# Patient Record
Sex: Female | Born: 1940 | Race: White | Hispanic: No | Marital: Single | Smoking: Former smoker
Health system: Southern US, Community
[De-identification: ages and names within clinical notes are randomized; demographics above are authoritative.]

## PROBLEM LIST (undated history)

## (undated) DIAGNOSIS — C3490 Malignant neoplasm of unspecified part of unspecified bronchus or lung: Secondary | ICD-10-CM

## (undated) DIAGNOSIS — G250 Essential tremor: Secondary | ICD-10-CM

## (undated) DIAGNOSIS — J449 Chronic obstructive pulmonary disease, unspecified: Secondary | ICD-10-CM

## (undated) HISTORY — PX: MOUTH SURGERY: SHX715

## (undated) HISTORY — PX: APPENDECTOMY: SHX54

## (undated) HISTORY — PX: BREAST SURGERY: SHX581

## (undated) HISTORY — PX: OVARY SURGERY: SHX727

## (undated) HISTORY — PX: DEEP BRAIN STIMULATOR PLACEMENT: SHX608

## (undated) SURGERY — OPEN REDUCTION INTERNAL FIXATION HIP
Anesthesia: Choice | Laterality: Right

---

## 2009-04-25 ENCOUNTER — Emergency Department (HOSPITAL_BASED_OUTPATIENT_CLINIC_OR_DEPARTMENT_OTHER): Admission: EM | Admit: 2009-04-25 | Discharge: 2009-04-26 | Payer: Self-pay | Admitting: Emergency Medicine

## 2009-04-25 ENCOUNTER — Ambulatory Visit: Payer: Self-pay | Admitting: Diagnostic Radiology

## 2009-04-26 ENCOUNTER — Ambulatory Visit: Payer: Self-pay | Admitting: Diagnostic Radiology

## 2009-04-27 ENCOUNTER — Ambulatory Visit: Payer: Self-pay | Admitting: Internal Medicine

## 2009-04-27 DIAGNOSIS — R0982 Postnasal drip: Secondary | ICD-10-CM | POA: Insufficient documentation

## 2009-04-27 DIAGNOSIS — J4489 Other specified chronic obstructive pulmonary disease: Secondary | ICD-10-CM | POA: Insufficient documentation

## 2009-04-27 DIAGNOSIS — C3490 Malignant neoplasm of unspecified part of unspecified bronchus or lung: Secondary | ICD-10-CM | POA: Insufficient documentation

## 2009-04-27 DIAGNOSIS — J449 Chronic obstructive pulmonary disease, unspecified: Secondary | ICD-10-CM

## 2009-05-01 ENCOUNTER — Telehealth: Payer: Self-pay | Admitting: Internal Medicine

## 2009-05-02 ENCOUNTER — Ambulatory Visit (HOSPITAL_COMMUNITY): Admission: RE | Admit: 2009-05-02 | Discharge: 2009-05-02 | Payer: Self-pay | Admitting: Internal Medicine

## 2009-05-04 ENCOUNTER — Ambulatory Visit: Payer: Self-pay | Admitting: Internal Medicine

## 2009-05-04 ENCOUNTER — Encounter: Payer: Self-pay | Admitting: Thoracic Surgery

## 2009-05-04 ENCOUNTER — Ambulatory Visit (HOSPITAL_COMMUNITY): Admission: RE | Admit: 2009-05-04 | Discharge: 2009-05-04 | Payer: Self-pay | Admitting: Thoracic Surgery

## 2009-05-04 ENCOUNTER — Ambulatory Visit: Payer: Self-pay | Admitting: Thoracic Surgery

## 2009-05-10 ENCOUNTER — Ambulatory Visit: Payer: Self-pay | Admitting: Internal Medicine

## 2009-05-11 ENCOUNTER — Encounter: Payer: Self-pay | Admitting: Pulmonary Disease

## 2009-05-11 ENCOUNTER — Encounter: Payer: Self-pay | Admitting: Internal Medicine

## 2009-05-11 ENCOUNTER — Ambulatory Visit: Admission: RE | Admit: 2009-05-11 | Discharge: 2009-07-28 | Payer: Self-pay | Admitting: Radiation Oncology

## 2009-05-11 ENCOUNTER — Ambulatory Visit: Payer: Self-pay | Admitting: Pulmonary Disease

## 2009-05-11 ENCOUNTER — Ambulatory Visit: Payer: Self-pay | Admitting: Internal Medicine

## 2009-05-11 DIAGNOSIS — F17200 Nicotine dependence, unspecified, uncomplicated: Secondary | ICD-10-CM

## 2009-05-12 ENCOUNTER — Telehealth: Payer: Self-pay | Admitting: Internal Medicine

## 2009-05-15 ENCOUNTER — Ambulatory Visit (HOSPITAL_COMMUNITY): Admission: RE | Admit: 2009-05-15 | Discharge: 2009-05-15 | Payer: Self-pay | Admitting: Internal Medicine

## 2009-05-16 LAB — COMPREHENSIVE METABOLIC PANEL
CO2: 22 mEq/L (ref 19–32)
Calcium: 10.4 mg/dL (ref 8.4–10.5)
Chloride: 105 mEq/L (ref 96–112)
Glucose, Bld: 199 mg/dL — ABNORMAL HIGH (ref 70–99)
Sodium: 142 mEq/L (ref 135–145)
Total Bilirubin: 0.4 mg/dL (ref 0.3–1.2)
Total Protein: 6.7 g/dL (ref 6.0–8.3)

## 2009-05-16 LAB — CBC WITH DIFFERENTIAL/PLATELET
Eosinophils Absolute: 0 10*3/uL (ref 0.0–0.5)
HCT: 39.7 % (ref 34.8–46.6)
LYMPH%: 23.7 % (ref 14.0–49.7)
MONO#: 0.6 10*3/uL (ref 0.1–0.9)
NEUT#: 5.7 10*3/uL (ref 1.5–6.5)
NEUT%: 68.3 % (ref 38.4–76.8)
Platelets: 311 10*3/uL (ref 145–400)
WBC: 8.4 10*3/uL (ref 3.9–10.3)
lymph#: 2 10*3/uL (ref 0.9–3.3)

## 2009-05-22 ENCOUNTER — Telehealth: Payer: Self-pay | Admitting: Internal Medicine

## 2009-05-26 ENCOUNTER — Telehealth (INDEPENDENT_AMBULATORY_CARE_PROVIDER_SITE_OTHER): Payer: Self-pay | Admitting: *Deleted

## 2009-05-26 ENCOUNTER — Telehealth: Payer: Self-pay | Admitting: Internal Medicine

## 2009-05-31 LAB — CBC WITH DIFFERENTIAL/PLATELET
BASO%: 0.2 % (ref 0.0–2.0)
Eosinophils Absolute: 0.1 10*3/uL (ref 0.0–0.5)
HCT: 37.7 % (ref 34.8–46.6)
LYMPH%: 12.5 % — ABNORMAL LOW (ref 14.0–49.7)
MCHC: 33.9 g/dL (ref 31.5–36.0)
MCV: 89.6 fL (ref 79.5–101.0)
MONO#: 0.4 10*3/uL (ref 0.1–0.9)
MONO%: 2.1 % (ref 0.0–14.0)
NEUT%: 84.9 % — ABNORMAL HIGH (ref 38.4–76.8)
Platelets: 108 10*3/uL — ABNORMAL LOW (ref 145–400)
RBC: 4.21 10*6/uL (ref 3.70–5.45)
WBC: 17.8 10*3/uL — ABNORMAL HIGH (ref 3.9–10.3)

## 2009-05-31 LAB — COMPREHENSIVE METABOLIC PANEL
ALT: 43 U/L — ABNORMAL HIGH (ref 0–35)
Albumin: 4 g/dL (ref 3.5–5.2)
CO2: 24 mEq/L (ref 19–32)
Calcium: 9 mg/dL (ref 8.4–10.5)
Chloride: 105 mEq/L (ref 96–112)
Creatinine, Ser: 0.74 mg/dL (ref 0.40–1.20)
Sodium: 140 mEq/L (ref 135–145)
Total Protein: 6.6 g/dL (ref 6.0–8.3)

## 2009-06-05 ENCOUNTER — Ambulatory Visit: Payer: Self-pay | Admitting: Internal Medicine

## 2009-06-06 ENCOUNTER — Telehealth: Payer: Self-pay | Admitting: Internal Medicine

## 2009-06-07 ENCOUNTER — Encounter: Payer: Self-pay | Admitting: Internal Medicine

## 2009-06-07 LAB — CBC WITH DIFFERENTIAL/PLATELET
BASO%: 0.6 % (ref 0.0–2.0)
MCHC: 34 g/dL (ref 31.5–36.0)
MONO#: 1.1 10*3/uL — ABNORMAL HIGH (ref 0.1–0.9)
RBC: 4.14 10*6/uL (ref 3.70–5.45)
RDW: 14.3 % (ref 11.2–14.5)
WBC: 9.9 10*3/uL (ref 3.9–10.3)
lymph#: 2 10*3/uL (ref 0.9–3.3)

## 2009-06-07 LAB — COMPREHENSIVE METABOLIC PANEL
ALT: 52 U/L — ABNORMAL HIGH (ref 0–35)
AST: 27 U/L (ref 0–37)
CO2: 22 mEq/L (ref 19–32)
Calcium: 9.9 mg/dL (ref 8.4–10.5)
Chloride: 102 mEq/L (ref 96–112)
Sodium: 137 mEq/L (ref 135–145)
Total Bilirubin: 0.4 mg/dL (ref 0.3–1.2)
Total Protein: 6.9 g/dL (ref 6.0–8.3)

## 2009-06-14 LAB — CBC WITH DIFFERENTIAL/PLATELET
BASO%: 0.4 % (ref 0.0–2.0)
EOS%: 0.4 % (ref 0.0–7.0)
LYMPH%: 16.6 % (ref 14.0–49.7)
MCH: 30.7 pg (ref 25.1–34.0)
MCHC: 34.6 g/dL (ref 31.5–36.0)
MONO#: 0.1 10*3/uL (ref 0.1–0.9)
Platelets: 317 10*3/uL (ref 145–400)
RBC: 3.62 10*6/uL — ABNORMAL LOW (ref 3.70–5.45)
WBC: 5.7 10*3/uL (ref 3.9–10.3)

## 2009-06-14 LAB — COMPREHENSIVE METABOLIC PANEL
ALT: 41 U/L — ABNORMAL HIGH (ref 0–35)
AST: 17 U/L (ref 0–37)
Alkaline Phosphatase: 147 U/L — ABNORMAL HIGH (ref 39–117)
CO2: 21 mEq/L (ref 19–32)
Creatinine, Ser: 0.59 mg/dL (ref 0.40–1.20)
Sodium: 136 mEq/L (ref 135–145)
Total Bilirubin: 0.8 mg/dL (ref 0.3–1.2)
Total Protein: 6.4 g/dL (ref 6.0–8.3)

## 2009-06-27 ENCOUNTER — Ambulatory Visit (HOSPITAL_COMMUNITY): Admission: RE | Admit: 2009-06-27 | Discharge: 2009-06-27 | Payer: Self-pay | Admitting: Internal Medicine

## 2009-06-28 ENCOUNTER — Encounter: Payer: Self-pay | Admitting: Internal Medicine

## 2009-06-28 LAB — COMPREHENSIVE METABOLIC PANEL
ALT: 45 U/L — ABNORMAL HIGH (ref 0–35)
AST: 25 U/L (ref 0–37)
Albumin: 3.9 g/dL (ref 3.5–5.2)
Alkaline Phosphatase: 117 U/L (ref 39–117)
Glucose, Bld: 91 mg/dL (ref 70–99)
Potassium: 3.6 mEq/L (ref 3.5–5.3)
Sodium: 140 mEq/L (ref 135–145)
Total Protein: 6.4 g/dL (ref 6.0–8.3)

## 2009-06-28 LAB — CBC WITH DIFFERENTIAL/PLATELET
EOS%: 0 % (ref 0.0–7.0)
LYMPH%: 12.1 % — ABNORMAL LOW (ref 14.0–49.7)
MCH: 29.6 pg (ref 25.1–34.0)
MCHC: 32.9 g/dL (ref 31.5–36.0)
MCV: 89.9 fL (ref 79.5–101.0)
MONO%: 11.1 % (ref 0.0–14.0)
NEUT#: 8.3 10*3/uL — ABNORMAL HIGH (ref 1.5–6.5)
RBC: 3.28 10*6/uL — ABNORMAL LOW (ref 3.70–5.45)
RDW: 16 % — ABNORMAL HIGH (ref 11.2–14.5)

## 2009-06-29 ENCOUNTER — Ambulatory Visit: Payer: Self-pay | Admitting: Internal Medicine

## 2009-07-12 LAB — CBC WITH DIFFERENTIAL/PLATELET
EOS%: 1.4 % (ref 0.0–7.0)
Eosinophils Absolute: 0.1 10*3/uL (ref 0.0–0.5)
LYMPH%: 15.1 % (ref 14.0–49.7)
MCH: 29.7 pg (ref 25.1–34.0)
MCV: 90.2 fL (ref 79.5–101.0)
MONO%: 13 % (ref 0.0–14.0)
NEUT#: 3.4 10*3/uL (ref 1.5–6.5)
Platelets: 46 10*3/uL — ABNORMAL LOW (ref 145–400)
RBC: 2.56 10*6/uL — ABNORMAL LOW (ref 3.70–5.45)
nRBC: 1 % — ABNORMAL HIGH (ref 0–0)

## 2009-07-12 LAB — COMPREHENSIVE METABOLIC PANEL
ALT: 33 U/L (ref 0–35)
Alkaline Phosphatase: 127 U/L — ABNORMAL HIGH (ref 39–117)
CO2: 26 mEq/L (ref 19–32)
Creatinine, Ser: 0.65 mg/dL (ref 0.40–1.20)
Glucose, Bld: 91 mg/dL (ref 70–99)
Total Bilirubin: 0.5 mg/dL (ref 0.3–1.2)

## 2009-07-13 ENCOUNTER — Encounter (HOSPITAL_COMMUNITY): Admission: RE | Admit: 2009-07-13 | Discharge: 2009-09-22 | Payer: Self-pay | Admitting: Internal Medicine

## 2009-07-13 LAB — TYPE & CROSSMATCH - CHCC

## 2009-07-19 ENCOUNTER — Encounter: Payer: Self-pay | Admitting: Internal Medicine

## 2009-07-19 ENCOUNTER — Ambulatory Visit: Admission: RE | Admit: 2009-07-19 | Discharge: 2009-07-19 | Payer: Self-pay | Admitting: Internal Medicine

## 2009-07-19 LAB — CBC WITH DIFFERENTIAL/PLATELET
BASO%: 0.4 % (ref 0.0–2.0)
EOS%: 0.4 % (ref 0.0–7.0)
Eosinophils Absolute: 0 10*3/uL (ref 0.0–0.5)
HCT: 33.7 % — ABNORMAL LOW (ref 34.8–46.6)
HGB: 11.2 g/dL — ABNORMAL LOW (ref 11.6–15.9)
LYMPH%: 21 % (ref 14.0–49.7)
MONO#: 0.7 10*3/uL (ref 0.1–0.9)
MONO%: 15.3 % — ABNORMAL HIGH (ref 0.0–14.0)
WBC: 4.6 10*3/uL (ref 3.9–10.3)
lymph#: 1 10*3/uL (ref 0.9–3.3)
nRBC: 0 % (ref 0–0)

## 2009-07-19 LAB — COMPREHENSIVE METABOLIC PANEL
ALT: 32 U/L (ref 0–35)
Alkaline Phosphatase: 128 U/L — ABNORMAL HIGH (ref 39–117)
Creatinine, Ser: 0.62 mg/dL (ref 0.40–1.20)
Sodium: 137 mEq/L (ref 135–145)
Total Bilirubin: 0.6 mg/dL (ref 0.3–1.2)
Total Protein: 6.3 g/dL (ref 6.0–8.3)

## 2009-07-26 LAB — COMPREHENSIVE METABOLIC PANEL
Albumin: 4.2 g/dL (ref 3.5–5.2)
BUN: 11 mg/dL (ref 6–23)
Calcium: 9 mg/dL (ref 8.4–10.5)
Chloride: 102 mEq/L (ref 96–112)
Glucose, Bld: 85 mg/dL (ref 70–99)
Potassium: 4 mEq/L (ref 3.5–5.3)

## 2009-07-26 LAB — CBC WITH DIFFERENTIAL/PLATELET
Basophils Absolute: 0 10*3/uL (ref 0.0–0.1)
Eosinophils Absolute: 0 10*3/uL (ref 0.0–0.5)
HCT: 28.4 % — ABNORMAL LOW (ref 34.8–46.6)
HGB: 9.9 g/dL — ABNORMAL LOW (ref 11.6–15.9)
MCV: 92.6 fL (ref 79.5–101.0)
MONO%: 10.5 % (ref 0.0–14.0)
NEUT#: 1.4 10*3/uL — ABNORMAL LOW (ref 1.5–6.5)
RDW: 17.1 % — ABNORMAL HIGH (ref 11.2–14.5)

## 2009-07-31 ENCOUNTER — Ambulatory Visit: Payer: Self-pay | Admitting: Internal Medicine

## 2009-08-04 ENCOUNTER — Ambulatory Visit (HOSPITAL_COMMUNITY): Admission: RE | Admit: 2009-08-04 | Discharge: 2009-08-04 | Payer: Self-pay | Admitting: Internal Medicine

## 2009-08-04 LAB — COMPREHENSIVE METABOLIC PANEL
AST: 26 U/L (ref 0–37)
Albumin: 4.4 g/dL (ref 3.5–5.2)
BUN: 6 mg/dL (ref 6–23)
Calcium: 9.4 mg/dL (ref 8.4–10.5)
Chloride: 105 mEq/L (ref 96–112)
Glucose, Bld: 145 mg/dL — ABNORMAL HIGH (ref 70–99)
Potassium: 4.3 mEq/L (ref 3.5–5.3)

## 2009-08-04 LAB — CBC WITH DIFFERENTIAL/PLATELET
Basophils Absolute: 0 10*3/uL (ref 0.0–0.1)
Eosinophils Absolute: 0 10*3/uL (ref 0.0–0.5)
HGB: 9.1 g/dL — ABNORMAL LOW (ref 11.6–15.9)
MONO#: 0 10*3/uL — ABNORMAL LOW (ref 0.1–0.9)
NEUT#: 15.2 10*3/uL — ABNORMAL HIGH (ref 1.5–6.5)
RDW: 16.6 % — ABNORMAL HIGH (ref 11.2–14.5)
lymph#: 1.3 10*3/uL (ref 0.9–3.3)

## 2009-08-09 ENCOUNTER — Encounter: Payer: Self-pay | Admitting: Internal Medicine

## 2009-08-09 LAB — CBC WITH DIFFERENTIAL/PLATELET
BASO%: 0.6 % (ref 0.0–2.0)
Eosinophils Absolute: 0 10*3/uL (ref 0.0–0.5)
MCV: 95.3 fL (ref 79.5–101.0)
MONO%: 13.7 % (ref 0.0–14.0)
NEUT#: 3.7 10*3/uL (ref 1.5–6.5)
RBC: 3 10*6/uL — ABNORMAL LOW (ref 3.70–5.45)
RDW: 19.1 % — ABNORMAL HIGH (ref 11.2–14.5)
WBC: 6.1 10*3/uL (ref 3.9–10.3)

## 2009-08-11 ENCOUNTER — Ambulatory Visit: Admission: RE | Admit: 2009-08-11 | Discharge: 2009-09-22 | Payer: Self-pay | Admitting: Radiation Oncology

## 2009-09-01 ENCOUNTER — Ambulatory Visit (HOSPITAL_COMMUNITY): Admission: RE | Admit: 2009-09-01 | Discharge: 2009-09-01 | Payer: Self-pay | Admitting: Radiation Oncology

## 2009-09-25 ENCOUNTER — Ambulatory Visit: Admission: RE | Admit: 2009-09-25 | Discharge: 2009-09-29 | Payer: Self-pay | Admitting: Radiation Oncology

## 2009-10-05 ENCOUNTER — Ambulatory Visit: Payer: Self-pay | Admitting: Internal Medicine

## 2009-10-09 LAB — CBC WITH DIFFERENTIAL/PLATELET
BASO%: 0.4 % (ref 0.0–2.0)
Basophils Absolute: 0 10*3/uL (ref 0.0–0.1)
EOS%: 1 % (ref 0.0–7.0)
Eosinophils Absolute: 0 10*3/uL (ref 0.0–0.5)
HGB: 13.4 g/dL (ref 11.6–15.9)
LYMPH%: 22.8 % (ref 14.0–49.7)
MCV: 96.8 fL (ref 79.5–101.0)
MONO#: 0.5 10*3/uL (ref 0.1–0.9)
MONO%: 10.5 % (ref 0.0–14.0)
NEUT#: 3.2 10*3/uL (ref 1.5–6.5)
RDW: 12.7 % (ref 11.2–14.5)
WBC: 4.9 10*3/uL (ref 3.9–10.3)
lymph#: 1.1 10*3/uL (ref 0.9–3.3)

## 2009-10-09 LAB — COMPREHENSIVE METABOLIC PANEL
ALT: 25 U/L (ref 0–35)
Albumin: 4 g/dL (ref 3.5–5.2)
Alkaline Phosphatase: 88 U/L (ref 39–117)
Calcium: 9.6 mg/dL (ref 8.4–10.5)
Chloride: 102 mEq/L (ref 96–112)
Creatinine, Ser: 0.64 mg/dL (ref 0.40–1.20)
Potassium: 3.7 mEq/L (ref 3.5–5.3)

## 2009-10-10 ENCOUNTER — Ambulatory Visit (HOSPITAL_COMMUNITY): Admission: RE | Admit: 2009-10-10 | Discharge: 2009-10-10 | Payer: Self-pay | Admitting: Internal Medicine

## 2009-10-11 ENCOUNTER — Encounter: Payer: Self-pay | Admitting: Internal Medicine

## 2009-11-03 ENCOUNTER — Encounter: Payer: Self-pay | Admitting: Internal Medicine

## 2010-01-02 ENCOUNTER — Ambulatory Visit: Payer: Self-pay | Admitting: Internal Medicine

## 2010-01-04 ENCOUNTER — Ambulatory Visit (HOSPITAL_COMMUNITY): Admission: RE | Admit: 2010-01-04 | Discharge: 2010-01-04 | Payer: Self-pay | Admitting: Radiation Oncology

## 2010-01-04 LAB — COMPREHENSIVE METABOLIC PANEL
ALT: 20 U/L (ref 0–35)
AST: 20 U/L (ref 0–37)
Albumin: 3.9 g/dL (ref 3.5–5.2)
Alkaline Phosphatase: 97 U/L (ref 39–117)
BUN: 7 mg/dL (ref 6–23)
CO2: 22 mEq/L (ref 19–32)
Calcium: 9.6 mg/dL (ref 8.4–10.5)
Creatinine, Ser: 0.72 mg/dL (ref 0.40–1.20)
Glucose, Bld: 170 mg/dL — ABNORMAL HIGH (ref 70–99)
Total Bilirubin: 0.5 mg/dL (ref 0.3–1.2)
Total Protein: 6.7 g/dL (ref 6.0–8.3)

## 2010-01-04 LAB — CBC WITH DIFFERENTIAL/PLATELET
BASO%: 0 % (ref 0.0–2.0)
Eosinophils Absolute: 0 10*3/uL (ref 0.0–0.5)
HGB: 12.4 g/dL (ref 11.6–15.9)
MONO%: 1.1 % (ref 0.0–14.0)
NEUT#: 5 10*3/uL (ref 1.5–6.5)
NEUT%: 88.2 % — ABNORMAL HIGH (ref 38.4–76.8)
RBC: 3.88 10*6/uL (ref 3.70–5.45)

## 2010-01-05 ENCOUNTER — Encounter: Payer: Self-pay | Admitting: Internal Medicine

## 2010-01-09 ENCOUNTER — Encounter: Payer: Self-pay | Admitting: Internal Medicine

## 2010-04-04 ENCOUNTER — Ambulatory Visit: Payer: Self-pay | Admitting: Internal Medicine

## 2010-04-06 ENCOUNTER — Ambulatory Visit (HOSPITAL_COMMUNITY): Admission: RE | Admit: 2010-04-06 | Discharge: 2010-04-06 | Payer: Self-pay | Admitting: Internal Medicine

## 2010-04-06 LAB — COMPREHENSIVE METABOLIC PANEL
Alkaline Phosphatase: 108 U/L (ref 39–117)
BUN: 8 mg/dL (ref 6–23)
CO2: 31 mEq/L (ref 19–32)
Calcium: 10.1 mg/dL (ref 8.4–10.5)
Chloride: 101 mEq/L (ref 96–112)
Creatinine, Ser: 0.72 mg/dL (ref 0.40–1.20)
Potassium: 4.8 mEq/L (ref 3.5–5.3)
Total Bilirubin: 0.6 mg/dL (ref 0.3–1.2)

## 2010-04-06 LAB — CBC WITH DIFFERENTIAL/PLATELET
BASO%: 1.1 % (ref 0.0–2.0)
Basophils Absolute: 0.1 10*3/uL (ref 0.0–0.1)
EOS%: 0 % (ref 0.0–7.0)
Eosinophils Absolute: 0 10*3/uL (ref 0.0–0.5)
HCT: 38 % (ref 34.8–46.6)
HGB: 12.7 g/dL (ref 11.6–15.9)
LYMPH%: 12.6 % — ABNORMAL LOW (ref 14.0–49.7)
MCH: 31.2 pg (ref 25.1–34.0)
MCHC: 33.4 g/dL (ref 31.5–36.0)
MCV: 93.4 fL (ref 79.5–101.0)
MONO#: 0.1 10*3/uL (ref 0.1–0.9)
MONO%: 1.6 % (ref 0.0–14.0)
NEUT%: 84.7 % — ABNORMAL HIGH (ref 38.4–76.8)
RBC: 4.07 10*6/uL (ref 3.70–5.45)
RDW: 12.9 % (ref 11.2–14.5)
nRBC: 0 % (ref 0–0)

## 2010-04-10 ENCOUNTER — Encounter: Payer: Self-pay | Admitting: Internal Medicine

## 2010-04-12 ENCOUNTER — Ambulatory Visit (HOSPITAL_COMMUNITY): Admission: RE | Admit: 2010-04-12 | Discharge: 2010-04-12 | Payer: Self-pay | Admitting: Internal Medicine

## 2010-07-04 ENCOUNTER — Ambulatory Visit: Payer: Self-pay | Admitting: Internal Medicine

## 2010-07-06 ENCOUNTER — Ambulatory Visit (HOSPITAL_COMMUNITY): Admission: RE | Admit: 2010-07-06 | Discharge: 2010-07-06 | Payer: Self-pay | Admitting: Internal Medicine

## 2010-07-06 LAB — COMPREHENSIVE METABOLIC PANEL
Alkaline Phosphatase: 101 U/L (ref 39–117)
BUN: 8 mg/dL (ref 6–23)
Chloride: 106 mEq/L (ref 96–112)
Total Bilirubin: 0.7 mg/dL (ref 0.3–1.2)
Total Protein: 6.8 g/dL (ref 6.0–8.3)

## 2010-07-06 LAB — CBC WITH DIFFERENTIAL/PLATELET
Basophils Absolute: 0 10*3/uL (ref 0.0–0.1)
Eosinophils Absolute: 0 10*3/uL (ref 0.0–0.5)
HGB: 12.1 g/dL (ref 11.6–15.9)
LYMPH%: 20.5 % (ref 14.0–49.7)
MCHC: 34.8 g/dL (ref 31.5–36.0)
MONO#: 0.3 10*3/uL (ref 0.1–0.9)
NEUT#: 4.4 10*3/uL (ref 1.5–6.5)
RBC: 3.76 10*6/uL (ref 3.70–5.45)
WBC: 5.9 10*3/uL (ref 3.9–10.3)

## 2010-07-11 ENCOUNTER — Encounter: Payer: Self-pay | Admitting: Internal Medicine

## 2010-10-04 ENCOUNTER — Ambulatory Visit: Payer: Self-pay | Admitting: Internal Medicine

## 2010-10-11 ENCOUNTER — Ambulatory Visit (HOSPITAL_COMMUNITY)
Admission: RE | Admit: 2010-10-11 | Discharge: 2010-10-11 | Payer: Self-pay | Source: Home / Self Care | Attending: Internal Medicine | Admitting: Internal Medicine

## 2010-10-11 LAB — CBC WITH DIFFERENTIAL/PLATELET
BASO%: 0.2 % (ref 0.0–2.0)
Basophils Absolute: 0 10*3/uL (ref 0.0–0.1)
EOS%: 17.8 % — ABNORMAL HIGH (ref 0.0–7.0)
Eosinophils Absolute: 0.9 10*3/uL — ABNORMAL HIGH (ref 0.0–0.5)
HCT: 38.2 % (ref 34.8–46.6)
HGB: 13.1 g/dL (ref 11.6–15.9)
LYMPH%: 14.9 % (ref 14.0–49.7)
MCH: 31.6 pg (ref 25.1–34.0)
MCHC: 34.3 g/dL (ref 31.5–36.0)
MCV: 92.3 fL (ref 79.5–101.0)
MONO#: 0 10*3/uL — ABNORMAL LOW (ref 0.1–0.9)
MONO%: 0.5 % (ref 0.0–14.0)
NEUT#: 3.5 10*3/uL (ref 1.5–6.5)
NEUT%: 66.6 % (ref 38.4–76.8)
Platelets: 199 10*3/uL (ref 145–400)
RBC: 4.14 10*6/uL (ref 3.70–5.45)
RDW: 13.1 % (ref 11.2–14.5)
WBC: 5.3 10*3/uL (ref 3.9–10.3)
lymph#: 0.8 10*3/uL — ABNORMAL LOW (ref 0.9–3.3)

## 2010-10-11 LAB — CMP (CANCER CENTER ONLY)
ALT(SGPT): 24 U/L (ref 10–47)
AST: 21 U/L (ref 11–38)
Albumin: 3.6 g/dL (ref 3.3–5.5)
Alkaline Phosphatase: 91 U/L — ABNORMAL HIGH (ref 26–84)
BUN, Bld: 10 mg/dL (ref 7–22)
CO2: 26 mEq/L (ref 18–33)
Calcium: 9.3 mg/dL (ref 8.0–10.3)
Chloride: 100 mEq/L (ref 98–108)
Creat: 0.6 mg/dl (ref 0.6–1.2)
Glucose, Bld: 147 mg/dL — ABNORMAL HIGH (ref 73–118)
Potassium: 4.2 mEq/L (ref 3.3–4.7)
Sodium: 138 mEq/L (ref 128–145)
Total Bilirubin: 0.4 mg/dl (ref 0.20–1.60)
Total Protein: 7.1 g/dL (ref 6.4–8.1)

## 2010-10-13 ENCOUNTER — Encounter: Payer: Self-pay | Admitting: Internal Medicine

## 2010-10-14 ENCOUNTER — Encounter: Payer: Self-pay | Admitting: Radiation Oncology

## 2010-10-14 ENCOUNTER — Encounter: Payer: Self-pay | Admitting: Internal Medicine

## 2010-10-18 ENCOUNTER — Encounter: Payer: Self-pay | Admitting: Internal Medicine

## 2010-10-18 ENCOUNTER — Other Ambulatory Visit: Payer: Self-pay | Admitting: Internal Medicine

## 2010-10-18 DIAGNOSIS — C3490 Malignant neoplasm of unspecified part of unspecified bronchus or lung: Secondary | ICD-10-CM

## 2010-10-23 NOTE — Letter (Signed)
[facility]   Imported By: Lester Marrero 02/26/2010 08:11:23  _____________________________________________________________________  External Attachment:    Type:   Image     Comment:   External Document

## 2010-10-23 NOTE — Letter (Signed)
[facility]   Imported By: Sherian Rein 04/30/2010 09:41:56  _____________________________________________________________________  External Attachment:    Type:   Image     Comment:   External Document

## 2010-10-23 NOTE — Letter (Signed)
[facility]   Imported By: Lester Willacy 02/05/2010 10:26:48  _____________________________________________________________________  External Attachment:    Type:   Image     Comment:   External Document

## 2010-10-23 NOTE — Letter (Signed)
[facility]   Imported By: Lester  11/09/2009 07:44:53  _____________________________________________________________________  External Attachment:    Type:   Image     Comment:   External Document

## 2010-10-23 NOTE — Letter (Signed)
[facility]   Imported By: Lester [place] 12/13/2009 08:50:46  _____________________________________________________________________  External Attachment:    Type:   Image     Comment:   External Document

## 2010-10-23 NOTE — Letter (Signed)
Summary: [facility]  [facility]   Imported By: Lennie Odor 07/24/2010 10:14:31  _____________________________________________________________________  External Attachment:    Type:   Image     Comment:   External Document

## 2010-11-14 NOTE — Letter (Signed)
Summary: [facility]  [facility]   Imported By: Lennie Odor 11/08/2010 17:06:10  _____________________________________________________________________  External Attachment:    Type:   Image     Comment:   External Document

## 2010-12-08 LAB — POCT I-STAT, CHEM 8
Calcium, Ion: 1.28 mmol/L (ref 1.12–1.32)
Chloride: 101 mEq/L (ref 96–112)
Creatinine, Ser: 0.7 mg/dL (ref 0.4–1.2)
HCT: 42 % (ref 36.0–46.0)
Potassium: 4.8 mEq/L (ref 3.5–5.1)
Sodium: 137 mEq/L (ref 135–145)

## 2010-12-27 LAB — CROSSMATCH
ABO/RH(D): A POS
Antibody Screen: NEGATIVE

## 2010-12-29 LAB — POCT CARDIAC MARKERS
CKMB, poc: <1 ng/mL — ABNORMAL LOW (ref 1.0–8.0)
CKMB, poc: <1 ng/mL — ABNORMAL LOW (ref 1.0–8.0)
Myoglobin, poc: 46.1 ng/mL (ref 12–200)
Myoglobin, poc: 55.9 ng/mL (ref 12–200)
Troponin i, poc: <0.05 ng/mL (ref 0.00–0.09)

## 2010-12-29 LAB — DIFFERENTIAL
Basophils Absolute: 0 10*3/uL (ref 0.0–0.1)
Eosinophils Relative: 1 % (ref 0–5)
Lymphs Abs: 2.3 10*3/uL (ref 0.7–4.0)
Monocytes Absolute: 0.9 10*3/uL (ref 0.1–1.0)
Neutrophils Relative %: 67 % (ref 43–77)

## 2010-12-29 LAB — BASIC METABOLIC PANEL
Calcium: 9.7 mg/dL (ref 8.4–10.5)
Glucose, Bld: 110 mg/dL — ABNORMAL HIGH (ref 70–99)

## 2010-12-29 LAB — CBC
HCT: 41.4 % (ref 36.0–46.0)
MCHC: 33 g/dL (ref 30.0–36.0)
MCV: 90.6 fL (ref 78.0–100.0)
RBC: 4.57 MIL/uL (ref 3.87–5.11)

## 2010-12-30 LAB — CBC
MCHC: 33.5 g/dL (ref 30.0–36.0)
MCV: 90.6 fL (ref 78.0–100.0)
RBC: 4.34 MIL/uL (ref 3.87–5.11)
RDW: 13.5 % (ref 11.5–15.5)

## 2010-12-30 LAB — COMPREHENSIVE METABOLIC PANEL
CO2: 25 mEq/L (ref 19–32)
Calcium: 9.2 mg/dL (ref 8.4–10.5)
Creatinine, Ser: 0.65 mg/dL (ref 0.4–1.2)
GFR calc Af Amer: >60 mL/min (ref >60)
GFR calc non Af Amer: >60 mL/min (ref >60)
Glucose, Bld: 113 mg/dL — ABNORMAL HIGH (ref 70–99)
Sodium: 137 mEq/L (ref 135–145)
Total Protein: 6.9 g/dL (ref 6.0–8.3)

## 2010-12-30 LAB — PROTIME-INR: Prothrombin Time: 12.6 seconds (ref 11.6–15.2)

## 2010-12-30 LAB — CULTURE, RESPIRATORY W GRAM STAIN

## 2010-12-30 LAB — APTT: aPTT: 27 seconds (ref 24–37)

## 2011-02-05 ENCOUNTER — Other Ambulatory Visit: Payer: Self-pay | Admitting: Internal Medicine

## 2011-02-05 ENCOUNTER — Ambulatory Visit (HOSPITAL_COMMUNITY)
Admission: RE | Admit: 2011-02-05 | Discharge: 2011-02-05 | Disposition: A | Discharge status: Home / Self Care | Payer: Medicare Other | Source: Ambulatory Visit | Attending: Internal Medicine | Admitting: Internal Medicine

## 2011-02-05 ENCOUNTER — Encounter (HOSPITAL_COMMUNITY): Payer: Self-pay

## 2011-02-05 ENCOUNTER — Encounter (HOSPITAL_BASED_OUTPATIENT_CLINIC_OR_DEPARTMENT_OTHER): Payer: Medicare Other | Admitting: Internal Medicine

## 2011-02-05 DIAGNOSIS — C3490 Malignant neoplasm of unspecified part of unspecified bronchus or lung: Secondary | ICD-10-CM | POA: Insufficient documentation

## 2011-02-05 DIAGNOSIS — D3500 Benign neoplasm of unspecified adrenal gland: Secondary | ICD-10-CM | POA: Insufficient documentation

## 2011-02-05 DIAGNOSIS — R279 Unspecified lack of coordination: Secondary | ICD-10-CM | POA: Insufficient documentation

## 2011-02-05 DIAGNOSIS — R42 Dizziness and giddiness: Secondary | ICD-10-CM | POA: Insufficient documentation

## 2011-02-05 DIAGNOSIS — Y842 Radiological procedure and radiotherapy as the cause of abnormal reaction of the patient, or of later complication, without mention of misadventure at the time of the procedure: Secondary | ICD-10-CM | POA: Insufficient documentation

## 2011-02-05 DIAGNOSIS — K7689 Other specified diseases of liver: Secondary | ICD-10-CM | POA: Insufficient documentation

## 2011-02-05 DIAGNOSIS — T66XXXA Radiation sickness, unspecified, initial encounter: Secondary | ICD-10-CM | POA: Insufficient documentation

## 2011-02-05 HISTORY — DX: Malignant neoplasm of unspecified part of unspecified bronchus or lung: C34.90

## 2011-02-05 LAB — CMP (CANCER CENTER ONLY)
Albumin: 3.3 g/dL (ref 3.3–5.5)
Alkaline Phosphatase: 109 U/L — ABNORMAL HIGH (ref 26–84)
BUN, Bld: 15 mg/dL (ref 7–22)
Creat: 0.7 mg/dl (ref 0.6–1.2)
Glucose, Bld: 164 mg/dL — ABNORMAL HIGH (ref 73–118)
Potassium: 4.2 mEq/L (ref 3.3–4.7)
Total Bilirubin: 0.4 mg/dl (ref 0.20–1.60)

## 2011-02-05 LAB — CBC WITH DIFFERENTIAL/PLATELET
Basophils Absolute: 0 10*3/uL (ref 0.0–0.1)
Eosinophils Absolute: 0 10*3/uL (ref 0.0–0.5)
HCT: 37.8 % (ref 34.8–46.6)
HGB: 13 g/dL (ref 11.6–15.9)
LYMPH%: 17 % (ref 14.0–49.7)
MCH: 31.9 pg (ref 25.1–34.0)
MCV: 92.8 fL (ref 79.5–101.0)
MONO%: 0.9 % (ref 0.0–14.0)
NEUT#: 4 10*3/uL (ref 1.5–6.5)
NEUT%: 81.9 % — ABNORMAL HIGH (ref 38.4–76.8)
Platelets: 187 10*3/uL (ref 145–400)

## 2011-02-05 MED ORDER — IOHEXOL 300 MG/ML  SOLN
100.0000 mL | Freq: Once | INTRAMUSCULAR | Status: AC | PRN
  Administered 2011-02-05: 100 mL via INTRAVENOUS

## 2011-02-05 NOTE — Consult Note (Signed)
Sherri Singh, RUPPERT NO.:  [id number]   MEDICAL RECORD NO.:  [id number]          PATIENT TYPE:  OIB   LOCATION:  2312                         FACILITY:  [facility]   PHYSICIAN:  Ines Bloomer, M.D. DATE OF BIRTH:  1941/01/24   DATE OF CONSULTATION:  DATE OF DISCHARGE:  05/04/2009                                 CONSULTATION   CHIEF COMPLAINT:  Hemoptysis.   HISTORY OF PRESENT ILLNESS:  This 70 year old smoker started to develop  hemoptysis, and was admitted with hemoptysis, and a chest x-ray and CT  scan and PET scan showed a left hilar lesion with left peritracheal and  mediastinal adenopathy and hilar adenopathy.  She still smokes 1 pack of  cigarettes a day, smoker for over 50 years.  She had no fevers, chills,  or excessive sputum.  No weight loss.   PAST MEDICAL HISTORY:  She has had a previous appendectomy.  She had a  left ovary removed.  She had a breast biopsy.  She has a deep brain  stimulator for tremors that was placed a week before and she also had  hemangioma.  She has chronic obstructive pulmonary disease and tremors.   ALLERGIES:  She is allergic to CONTRAST, which causes hives.   MEDICATIONS:  1. Effexor 150 mg a day.  2. Propranolol 80 mg a day.  3. Vitamin D3.  4. Advair.  5. Spiriva.   FAMILY HISTORY:  Noncontributory.   SOCIAL HISTORY:  She is here today with her sister.  Positive tobacco  use.  Negative alcohol.   REVIEW OF SYSTEMS:  CARDIAC:  No angina or atrial fibrillation.  PULMONARY:  See history of present illness.  NEUROLOGICAL:  See past  medical history.  GI:  No nausea, vomiting, constipation, or diarrhea.  EYES/ENT:  She has glasses, but no change in her eyesight or hearing.  MUSCULOSKELETAL:  Arthritis.  HEMATOLOGICAL:  No problems with bleeding  or clotting disorders.  PSYCHIATRIC:  Treated for depression.  GU:  No  kidney disease, dysuria, or frequent urination.   PHYSICAL EXAMINATION:  VITAL SIGNS:  Her blood  pressure is 140/60, pulse  80, respirations are 18, and sats are 92%.  HEAD, EYES, EARS, NOSE AND THROAT:  Unremarkable.  NECK:  Supple without thyromegaly.  CHEST:  There is a right brain stimulator.  HEART:  Regular sinus rhythm.  No murmur.  ABDOMEN:  Soft.  Bowel sounds are normal.  EXTREMITIES:  Pulses 2+.  There is no clubbing or edema.  NEUROLOGIC:  She is oriented x3.  Sensory and motor intact.   IMPRESSION:  1. Left hilar mass, rule out either small cell or non-small cell      cancer.  2. History of tobacco abuse.  3. Essential tremors with brain stimulator.  4. History of depression.   PLAN:  Bronchoscopy and mediastinoscopy, bronchoscopy in [place].      Ines Bloomer, M.D.  Electronically Signed     DPB/MEDQ  D:  05/04/2009  T:  05/04/2009  Job:  [id number]

## 2011-02-05 NOTE — Op Note (Signed)
[name], [name] NO.:  [id number]   MEDICAL RECORD NO.:  [id number]          PATIENT TYPE:  INP   LOCATION:  2312                         FACILITY:  [facility]   PHYSICIAN:  Kalman Shan, MD   DATE OF BIRTH:  Mar 16, 1941   DATE OF PROCEDURE:  05/04/2009  DATE OF DISCHARGE:  05/02/2009                               OPERATIVE REPORT   TYPE OF PROCEDURES:  1. Bronchoscopy with airway exam and endobronchial biopsies.  2. Endobronchial ultrasound guided biopsies.   OPERATOR:  1. Kalman Shan, MD  2. Ines Bloomer, M.D.   PREPROCEDURE ASSESSMENT:  Sherri Singh is a 70 year old female with  GOLD stage III COPD also brain stimulator for essential tremors, on  propranolol.  She has presented with some chest pains.  Investigation  has shown a big left hilar mass encompassing the left main bronchus.  She is here for biopsies.  Preprocedure evaluation satisfied, her  requirements to have a safe procedure in the operating room.  She was  considered a low risk despite her COPD.  Basic metabolic panel, CBC and  coagulation parameters were normal.  Prior to the procedure, the patient  had turned electronic stimulator off so that she can have EKG tracings.   Preprocedure sedation planned with general anesthesia by [facility]  [facility].   PROCEDURE NOTE:  After induction of general anesthesia, a small flexible  regular bronchoscope was passed through the endotracheal tube.  A  detailed airway exam was carried out.  The carina were sharp and normal.  Some mucus tinged with blood was seen in the trachea.  This was cleared  out and no endobronchial lesion was seen in the trachea.  The detailed  airway exam revealed normal right-sided airway anatomy.  Attention was  turned to the left side and immediately we could see brawny, indurated,  corrugated airway and the left main bronchus with some endobronchial  projections that was smooth but no obvious ulceration.   Entering into  the left upper lobe, it seemed very compressed both extrinsic and a  tendency to form a fungating mass but without actual fungation was seen  with partial collapse of the left upper lobe airway, would say  approximately over 50%.  The left lower lobe entrance was normal  although distorted.  The tumor could be seen bulging into the airways.  There was some hemoptysis blood seen coming up from the left upper lobe.  The left upper lobe was then lavaged with 20 mL x3 of saline with clear  returns.  No further endobronchial lesions were seen.   After this, the flexible scope was withdrawn.   The endobronchial ultrasound was then introduced and in the left main  bronchus, 2 airway biopsies with endobronchial ultrasound was performed  under endobronchial ultrasound guidance.  These were transbronchial  needle biopsies through endobronchial ultrasound.  There is some  difficulty in identifying the exact site for biopsy because of the  anatomy and requiring a lot of manipulation with the bronchoscope.  After this, attention was turned to the left paratracheal 4L lymph nodes  and 2 endobronchial  needle biopsies were performed.  During this time,  we got a preliminary report that the left main bronchus biopsies were  suggestive of small-cell carcinoma.   After this, the endobronchial ultrasound was withdrawn and then the  regular bronchoscope was introduced and 6 or 7 endobronchial biopsies in  the left upper lobe takeoff of the secondary carina were performed and  this is being sent for analysis.   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  2. Left hilar mass, endobronchial ultrasound guided biopsy x2.  3. Left paratracheal 4L lymph node endobronchial biopsy x2.  4. Left upper lobe bronchoalveolar lavage for cytology and      microbiology.  5. Left upper lobe endobronchial biopsy x6 or 7 pieces.   POST PROCEDURE PLAN:  1. The patient will be sent to the recovery room for  recovery after      anesthesia by the [facility]hesia Services.  2. The patient will be sent to see Dr. Arbutus Ped, the radiation      oncologist on Thursday, next week, which is August 19 at      [facility].   We will await for the formal report.      Kalman Shan, MD  Electronically Signed     MR/MEDQ  D:  05/04/2009  T:  05/04/2009  Job:  [id number]

## 2011-02-12 ENCOUNTER — Other Ambulatory Visit: Payer: Self-pay | Admitting: Internal Medicine

## 2011-02-12 ENCOUNTER — Encounter (HOSPITAL_BASED_OUTPATIENT_CLINIC_OR_DEPARTMENT_OTHER): Payer: Medicare Other | Admitting: Internal Medicine

## 2011-02-12 DIAGNOSIS — D3500 Benign neoplasm of unspecified adrenal gland: Secondary | ICD-10-CM

## 2011-02-12 DIAGNOSIS — C3490 Malignant neoplasm of unspecified part of unspecified bronchus or lung: Secondary | ICD-10-CM

## 2011-06-05 ENCOUNTER — Other Ambulatory Visit: Payer: Self-pay | Admitting: Internal Medicine

## 2011-06-05 ENCOUNTER — Encounter (HOSPITAL_BASED_OUTPATIENT_CLINIC_OR_DEPARTMENT_OTHER): Payer: Medicare Other | Admitting: Internal Medicine

## 2011-06-05 ENCOUNTER — Ambulatory Visit (HOSPITAL_COMMUNITY)
Admission: RE | Admit: 2011-06-05 | Discharge: 2011-06-05 | Disposition: A | Discharge status: Home / Self Care | Payer: Medicare Other | Source: Ambulatory Visit | Attending: Internal Medicine | Admitting: Internal Medicine

## 2011-06-05 DIAGNOSIS — D3500 Benign neoplasm of unspecified adrenal gland: Secondary | ICD-10-CM | POA: Insufficient documentation

## 2011-06-05 DIAGNOSIS — R279 Unspecified lack of coordination: Secondary | ICD-10-CM | POA: Insufficient documentation

## 2011-06-05 DIAGNOSIS — G9389 Other specified disorders of brain: Secondary | ICD-10-CM | POA: Insufficient documentation

## 2011-06-05 DIAGNOSIS — R0602 Shortness of breath: Secondary | ICD-10-CM | POA: Insufficient documentation

## 2011-06-05 DIAGNOSIS — C3490 Malignant neoplasm of unspecified part of unspecified bronchus or lung: Secondary | ICD-10-CM | POA: Insufficient documentation

## 2011-06-05 DIAGNOSIS — R4789 Other speech disturbances: Secondary | ICD-10-CM | POA: Insufficient documentation

## 2011-06-05 DIAGNOSIS — R42 Dizziness and giddiness: Secondary | ICD-10-CM | POA: Insufficient documentation

## 2011-06-05 DIAGNOSIS — G319 Degenerative disease of nervous system, unspecified: Secondary | ICD-10-CM | POA: Insufficient documentation

## 2011-06-05 DIAGNOSIS — K7689 Other specified diseases of liver: Secondary | ICD-10-CM | POA: Insufficient documentation

## 2011-06-05 LAB — CMP (CANCER CENTER ONLY)
ALT(SGPT): 31 U/L (ref 10–47)
Albumin: 3.7 g/dL (ref 3.3–5.5)
CO2: 27 mEq/L (ref 18–33)
Calcium: 9.7 mg/dL (ref 8.0–10.3)
Chloride: 103 mEq/L (ref 98–108)
Glucose, Bld: 182 mg/dL — ABNORMAL HIGH (ref 73–118)
Potassium: 4.3 mEq/L (ref 3.3–4.7)
Sodium: 147 mEq/L — ABNORMAL HIGH (ref 128–145)
Total Protein: 7.4 g/dL (ref 6.4–8.1)

## 2011-06-05 LAB — CBC WITH DIFFERENTIAL/PLATELET
BASO%: 0.3 % (ref 0.0–2.0)
Eosinophils Absolute: 0 10*3/uL (ref 0.0–0.5)
LYMPH%: 16.5 % (ref 14.0–49.7)
MCHC: 34 g/dL (ref 31.5–36.0)
MONO#: 0.1 10*3/uL (ref 0.1–0.9)
NEUT#: 4.6 10*3/uL (ref 1.5–6.5)
Platelets: 206 10*3/uL (ref 145–400)
RBC: 4.28 10*6/uL (ref 3.70–5.45)
RDW: 13 % (ref 11.2–14.5)
WBC: 5.6 10*3/uL (ref 3.9–10.3)
lymph#: 0.9 10*3/uL (ref 0.9–3.3)

## 2011-06-05 MED ORDER — IOHEXOL 300 MG/ML  SOLN
100.0000 mL | Freq: Once | INTRAMUSCULAR | Status: AC | PRN
  Administered 2011-06-05: 100 mL via INTRAVENOUS

## 2011-06-12 ENCOUNTER — Encounter (HOSPITAL_BASED_OUTPATIENT_CLINIC_OR_DEPARTMENT_OTHER): Payer: Medicare Other | Admitting: Internal Medicine

## 2011-06-12 DIAGNOSIS — D3500 Benign neoplasm of unspecified adrenal gland: Secondary | ICD-10-CM

## 2011-06-12 DIAGNOSIS — C3490 Malignant neoplasm of unspecified part of unspecified bronchus or lung: Secondary | ICD-10-CM

## 2011-07-18 ENCOUNTER — Other Ambulatory Visit: Payer: Self-pay | Admitting: Family Medicine

## 2011-07-18 DIAGNOSIS — Z1231 Encounter for screening mammogram for malignant neoplasm of breast: Secondary | ICD-10-CM

## 2011-08-08 ENCOUNTER — Ambulatory Visit
Admission: RE | Admit: 2011-08-08 | Discharge: 2011-08-08 | Disposition: A | Discharge status: Home / Self Care | Payer: Medicare Other | Source: Ambulatory Visit | Attending: Family Medicine | Admitting: Family Medicine

## 2011-08-08 DIAGNOSIS — Z1231 Encounter for screening mammogram for malignant neoplasm of breast: Secondary | ICD-10-CM

## 2011-11-18 ENCOUNTER — Other Ambulatory Visit: Payer: Self-pay | Admitting: Family Medicine

## 2011-11-18 DIAGNOSIS — R270 Ataxia, unspecified: Secondary | ICD-10-CM

## 2011-11-18 DIAGNOSIS — R4781 Slurred speech: Secondary | ICD-10-CM

## 2011-11-21 ENCOUNTER — Ambulatory Visit
Admission: RE | Admit: 2011-11-21 | Discharge: 2011-11-21 | Disposition: A | Discharge status: Home / Self Care | Payer: Medicare Other | Source: Ambulatory Visit | Attending: Family Medicine | Admitting: Family Medicine

## 2011-11-21 DIAGNOSIS — R4781 Slurred speech: Secondary | ICD-10-CM

## 2011-11-21 DIAGNOSIS — R270 Ataxia, unspecified: Secondary | ICD-10-CM

## 2011-11-21 MED ORDER — IOHEXOL 300 MG/ML  SOLN
75.0000 mL | Freq: Once | INTRAMUSCULAR | Status: AC | PRN
  Administered 2011-11-21: 75 mL via INTRAVENOUS

## 2011-12-10 ENCOUNTER — Ambulatory Visit: Payer: Medicare Other | Admitting: Internal Medicine

## 2011-12-10 ENCOUNTER — Other Ambulatory Visit: Payer: Medicare Other | Admitting: Lab

## 2012-03-07 ENCOUNTER — Emergency Department (HOSPITAL_BASED_OUTPATIENT_CLINIC_OR_DEPARTMENT_OTHER)
Admission: EM | Admit: 2012-03-07 | Discharge: 2012-03-07 | Disposition: A | Discharge status: Home / Self Care | Payer: Medicare Other | Attending: Emergency Medicine | Admitting: Emergency Medicine

## 2012-03-07 ENCOUNTER — Encounter (HOSPITAL_BASED_OUTPATIENT_CLINIC_OR_DEPARTMENT_OTHER): Payer: Self-pay | Admitting: *Deleted

## 2012-03-07 DIAGNOSIS — Z23 Encounter for immunization: Secondary | ICD-10-CM | POA: Insufficient documentation

## 2012-03-07 DIAGNOSIS — S098XXA Other specified injuries of head, initial encounter: Secondary | ICD-10-CM

## 2012-03-07 DIAGNOSIS — S0990XA Unspecified injury of head, initial encounter: Secondary | ICD-10-CM | POA: Insufficient documentation

## 2012-03-07 DIAGNOSIS — W1800XA Striking against unspecified object with subsequent fall, initial encounter: Secondary | ICD-10-CM

## 2012-03-07 DIAGNOSIS — W1809XA Striking against other object with subsequent fall, initial encounter: Secondary | ICD-10-CM | POA: Insufficient documentation

## 2012-03-07 DIAGNOSIS — IMO0002 Reserved for concepts with insufficient information to code with codable children: Secondary | ICD-10-CM

## 2012-03-07 DIAGNOSIS — S0180XA Unspecified open wound of other part of head, initial encounter: Secondary | ICD-10-CM | POA: Insufficient documentation

## 2012-03-07 DIAGNOSIS — Y92009 Unspecified place in unspecified non-institutional (private) residence as the place of occurrence of the external cause: Secondary | ICD-10-CM | POA: Insufficient documentation

## 2012-03-07 DIAGNOSIS — Z85118 Personal history of other malignant neoplasm of bronchus and lung: Secondary | ICD-10-CM | POA: Insufficient documentation

## 2012-03-07 MED ORDER — TETANUS-DIPHTH-ACELL PERTUSSIS 5-2.5-18.5 LF-MCG/0.5 IM SUSP
0.5000 mL | Freq: Once | INTRAMUSCULAR | Status: AC
  Administered 2012-03-07: 0.5 mL via INTRAMUSCULAR
  Filled 2012-03-07: qty 0.5

## 2012-03-07 MED ORDER — TETANUS-DIPHTHERIA TOXOIDS TD 5-2 LFU IM INJ: 0.5000 mL | INJECTION | Freq: Once | INTRAMUSCULAR | Status: DC

## 2012-03-07 MED ORDER — ACETAMINOPHEN 325 MG PO TABS
650.0000 mg | ORAL_TABLET | Freq: Once | ORAL | Status: AC
  Administered 2012-03-07: 650 mg via ORAL
  Filled 2012-03-07: qty 2

## 2012-03-07 NOTE — ED Notes (Signed)
Pt states she put on her left sock and was putting on her other sock and just fell over. Hit head on window sill. Superficial lac to right side of head. "Falling a lot" PERL. Hx of brain surgery and stimulator right anterior chest. Speech normal for pt.

## 2012-03-07 NOTE — ED Provider Notes (Signed)
History   This chart was scribed for Forbes Cellar, MD by Melba Coon. The patient was seen in room MH09/MH09 and the patient's care was started at 3:05PM.    CSN: [id number]  Arrival date & time 03/07/12  1347   First MD Initiated Contact with Patient 03/07/12 1501      Chief Complaint  Patient presents with  . Fall    (Consider location/radiation/quality/duration/timing/severity/associated sxs/prior treatment) HPI Sherri Singh is a 71 y.o. female who presents to the Emergency Department complaining of constant, moderate headache and head injury to the right side of the forehead with an onset noon today pertaining to a fall with head contact but no LOC. Pt lost her balance while standing and attempting to put on her right sock, fell over, hit her head on the window seal, and fell onto the floor. Pt has had past balance problems and, so far, it is inconclusive as to what has been causing frequent loss of balance; pt has been Rx "dizziness pills". Nml mental status compared to baseline. No fever, neck pain, sore throat, rash, back pain, CP, SOB, abd pain, n/v/d, dysuria, or extremity pain, edema, weakness, numbness, or tingling. Hx of brain surgery (brain stimulators for tremors - Hx of Parkinson's disease). Tetanus shot not up-to-date. Pt does not take blood thinners. Denies headache, dizziness, cp, palpitations, shortness of breath pre fall and currently. No neck pain or back pain. Denies hip pain. No other pertinent medical symptoms.   She has a history of recent falls and problems with her balance. She states that she's been worked up extensively by her primary care doctor and a neurologist including CT head and recent MRI which were unrevealing.  ED Notes, ED Provider Notes from 03/07/12 0000 to 03/07/12 14:22:25       York Cerise, RN 03/07/2012 14:19      Pt states she put on her left sock and was putting on her other sock and just fell over. Hit head on window sill.  Superficial lac to right side of head. "Falling a lot" PERL. Hx of brain surgery and stimulator right anterior chest. Speech normal for pt.     Past Medical History  Diagnosis Date  . Lung cancer     lung ca dx 04/2009    Past Surgical History  Procedure Date  . Deep brain stimulator placement   . Mouth surgery   . Breast surgery   . Appendectomy   . Ovary surgery     History reviewed. No pertinent family history.  History  Substance Use Topics  . Smoking status: Never Smoker   . Smokeless tobacco: Not on file  . Alcohol Use: No    OB History    Grav Para Term Preterm Abortions TAB SAB Ect Mult Living                  Review of Systems 10 Systems reviewed and all are negative for acute change except as noted in the HPI.   Allergies  Budesonide-formoterol fumarate and Iohexol  Home Medications   Current Outpatient Rx  Name Route Sig Dispense Refill  . VITAMIN D PO Oral Take by mouth.    . METOPROLOL SUCCINATE ER PO Oral Take 1 tablet by mouth 2 (two) times daily.    . RED YEAST RICE PO Oral Take by mouth.      BP 125/54  Pulse 100  Temp 97.7 F (36.5 C) (Oral)  Resp 20  Ht 5\' 4"  (1.626  m)  Wt 138 lb (62.596 kg)  BMI 23.69 kg/m2  SpO2 97%  Physical Exam  Nursing note and vitals reviewed. Constitutional: She is oriented to person, place, and time. She appears well-developed and well-nourished. No distress.       Awake, alert, nontoxic appearance.  HENT:  Head: Normocephalic and atraumatic.  Eyes: EOM are normal. Pupils are equal, round, and reactive to light. Right eye exhibits no discharge. Left eye exhibits no discharge.  Neck: Normal range of motion. Neck supple.  Cardiovascular: Normal rate and regular rhythm.   Murmur (systolic 2/6) heard. Pulmonary/Chest: Effort normal and breath sounds normal. She exhibits no tenderness.  Abdominal: Soft. Bowel sounds are normal. She exhibits no mass. There is no tenderness. There is no rebound and no  guarding.  Musculoskeletal: Normal range of motion. She exhibits no tenderness.       Baseline ROM, no obvious new focal weakness. No C, T, or L-spine tenderness.  Neurological: She is alert and oriented to person, place, and time.       Mental status and motor strength appears baseline for patient and situation. Cranial nerves II-XII intact. Strength 5/5 in all extremities. No pronator drift. Nml finger-to-nose. No tremors.  No facial droop.  Skin: Skin is warm. No rash noted.       2 cm laceration lateral to rt eyebrow w/o active bleeding  Psychiatric: She has a normal mood and affect. Her behavior is normal.    ED Course  LACERATION REPAIR Date/Time: 03/07/2012 3:58 PM Performed by: Forbes Cellar Authorized by: Forbes Cellar Consent: Verbal consent obtained. Consent given by: patient Patient understanding: patient states understanding of the procedure being performed Patient consent: the patient's understanding of the procedure matches consent given Procedure consent: procedure consent matches procedure scheduled Laceration length: 2 cm Foreign bodies: no foreign bodies Irrigation solution: saline Amount of cleaning: standard Skin closure: glue Patient tolerance: Patient tolerated the procedure well with no immediate complications. Comments: No active bleeding, wound well approx   (including critical care time)  DIAGNOSTIC STUDIES: Oxygen Saturation is 97% on room air, normal by my interpretation.    COORDINATION OF CARE:  3;15PM - EDMD will order a dermabond of the laceration for the pt.  Labs Reviewed - No data to display No results found.   1. Fall against object   2. Laceration   3. Blunt head trauma    MDM  Status post mechanical fall presents with mild headache, facial laceration. The patient is refusing a CT scan of her head. Both the patient and her family are aware that we cannot rule out intracranial hemorrhage, malfunction of her deep brain stimulator.  She is neurologically intact. Wound repaired. Dermabond. Well approximated no active bleeding. She'll be discharged home in stable condition with her daughter. Given strict precautions for return.  I personally performed the services described in this documentation, which was scribed in my presence. The recorded information has been reviewed and considered.         Forbes Cellar, MD 03/07/12 1559

## 2012-03-07 NOTE — Discharge Instructions (Signed)
KEEP AREA CLEAN AND DRY. AVOID ANTIBIOTIC OINTMENT AS IT WILL CAUSE THE GLUE TO DISSOLVE.  Laceration Care, Adult A laceration is a cut or lesion that goes through all layers of the skin and into the tissue just beneath the skin. TREATMENT  Some lacerations may not require closure. Some lacerations may not be able to be closed due to an increased risk of infection. It is important to see your caregiver as soon as possible after an injury to minimize the risk of infection and maximize the opportunity for successful closure. If closure is appropriate, pain medicines may be given, if needed. The wound will be cleaned to help prevent infection. Your caregiver will use stitches (sutures), staples, wound glue (adhesive), or skin adhesive strips to repair the laceration. These tools bring the skin edges together to allow for faster healing and a better cosmetic outcome. However, all wounds will heal with a scar. Once the wound has healed, scarring can be minimized by covering the wound with sunscreen during the day for 1 full year. HOME CARE INSTRUCTIONS  For sutures or staples:  Keep the wound clean and dry.   If you were given a bandage (dressing), you should change it at least once a day. Also, change the dressing if it becomes wet or dirty, or as directed by your caregiver.   Wash the wound with soap and water 2 times a day. Rinse the wound off with water to remove all soap. Pat the wound dry with a clean towel.   After cleaning, apply a thin layer of the antibiotic ointment as recommended by your caregiver. This will help prevent infection and keep the dressing from sticking.   You may shower as usual after the first 24 hours. Do not soak the wound in water until the sutures are removed.   Only take over-the-counter or prescription medicines for pain, discomfort, or fever as directed by your caregiver.   Get your sutures or staples removed as directed by your caregiver.  For skin adhesive  strips:  Keep the wound clean and dry.   Do not get the skin adhesive strips wet. You may bathe carefully, using caution to keep the wound dry.   If the wound gets wet, pat it dry with a clean towel.   Skin adhesive strips will fall off on their own. You may trim the strips as the wound heals. Do not remove skin adhesive strips that are still stuck to the wound. They will fall off in time.  For wound adhesive:  You may briefly wet your wound in the shower or bath. Do not soak or scrub the wound. Do not swim. Avoid periods of heavy perspiration until the skin adhesive has fallen off on its own. After showering or bathing, gently pat the wound dry with a clean towel.   Do not apply liquid medicine, cream medicine, or ointment medicine to your wound while the skin adhesive is in place. This may loosen the film before your wound is healed.   If a dressing is placed over the wound, be careful not to apply tape directly over the skin adhesive. This may cause the adhesive to be pulled off before the wound is healed.   Avoid prolonged exposure to sunlight or tanning lamps while the skin adhesive is in place. Exposure to ultraviolet light in the first year will darken the scar.   The skin adhesive will usually remain in place for 5 to 10 days, then naturally fall off the skin.  Do not pick at the adhesive film.  You may need a tetanus shot if:  You cannot remember when you had your last tetanus shot.   You have never had a tetanus shot.  If you get a tetanus shot, your arm may swell, get red, and feel warm to the touch. This is common and not a problem. If you need a tetanus shot and you choose not to have one, there is a rare chance of getting tetanus. Sickness from tetanus can be serious. SEEK MEDICAL CARE IF:   You have redness, swelling, or increasing pain in the wound.   You see a red line that goes away from the wound.   You have yellowish-white fluid (pus) coming from the wound.   You  have a fever.   You notice a bad smell coming from the wound or dressing.   Your wound breaks open before or after sutures have been removed.   You notice something coming out of the wound such as wood or glass.   Your wound is on your hand or foot and you cannot move a finger or toe.  SEEK IMMEDIATE MEDICAL CARE IF:   Your pain is not controlled with prescribed medicine.   You have severe swelling around the wound causing pain and numbness or a change in color in your arm, hand, leg, or foot.   Your wound splits open and starts bleeding.   You have worsening numbness, weakness, or loss of function of any joint around or beyond the wound.   You develop painful lumps near the wound or on the skin anywhere on your body.  MAKE SURE YOU:   Understand these instructions.   Will watch your condition.   Will get help right away if you are not doing well or get worse.  Document Released: 09/09/2005 Document Revised: 08/29/2011 Document Reviewed: 03/05/2011 [facility] Patient Information 2012 [place], [place].  RESOURCE GUIDE  Dental Problems  Patients with Medicaid: [facility] [phone] [address]                                           [phone] [address] Phone:  [phone]                                                   Phone:  [phone]  If unable to pay or uninsured, contact:  [facility] or [facility] to become qualified for the adult dental clinic.  Chronic Pain Problems Contact [facility]  [phone] Patients need to be referred by their primary care doctor.  Insufficient Money for Medicine Contact [employer] Way:  call "211" or [facility] [phone].  No Primary Care Doctor Call [facility]  [phone] Other agencies that provide inexpensive medical care    [facility]  [phone]    [facility]  [phone]    [facility]  [phone]     [facility]  [phone]    [facility]  [phone]    [facility]  [phone]  [facility]  [phone] [facility]  [phone] [facility]   [phone] (emergency services [phone])  Abuse/Neglect [facility] Child Abuse Hotline ([phone] [facility] Child Abuse Hotline (240)045-4603 (After Hours)  Emergency Shelter [facility] [phone]  [facility] [facility] [phone] [facility] [phone]  MRSA Hotline #:   626-019-7803    [facility] Resources  [facility]  [employer]                           [facility] [address] [place]                     [address]         [address]  Blondell Reveal Phone:  [phone]                                  Phone:  [phone]                   Phone:  [phone]  [facility] Phone:  [phone]  [facility] Child Abuse Hotline (509)732-1906 475-174-3829 (After Hours)   Traumatic Brain Injury Traumatic brain injury (TBI) occurs when an injury to the head causes the brain to move back and forth. The risk of brain injury varies with the severity of the trauma. The damage can be confined to one area of the brain (focal) or involve different areas of the brain (diffuse). The severity of a brain injury can range from:  A blow or jolt to the head that disrupts the normal function of the brain (concussion).   A deep state of unconsciousness (coma).   Death.  CAUSES  A brain injury can result from:  A closed head injury. This occurs when the head suddenly and violently hits an object but the object does not break through the skull. Examples include:   A direct blow (hitting your head on a hard surface).   An  indirect blow (when your head moves rapidly and violently back and forth like in a car crash). This injury is called countrecoup (involving a blow and counter blow). Shaken baby syndrome is a severe type of this injury. It happens when a baby is shaken forcibly enough to cause extreme countrecoup injury.   Penetrating head injury. A penetrating head injury occurs when an object pierces the skull and enters the brain tissue. Examples include:   A skull fracture occurs when the skull cracks or breaks.   A depressed skull fracture occurs when pieces of the broken skull press into the tissue of the brain. This can cause bruising of the brain tissue called a contusion.  In both closed and penetrating head injuries, damage to blood vessels can cause heavy bleeding into or around the brain.  SYMPTOMS  The symptoms of a TBI depend on the type and severity of the injury. The most common symptoms include:   Confusion (disorientation) or other thinking problems.   An inability to remember events around the time of the injury (amnesia).   Difficulty staying awake or passing out (loss of consciousness).   Headache.   Blurry vision.   Vomiting.   Seizures.   Swelling of the scalp. This occurs because of bleeding or swelling under the skin of the skull when the head is hit.  TREATMENT Treatment of traumatic brain injury can involve a range of different medical options:  If a brain injury is moderate to severe, a hospital stay will be necessary to monitor:   Neurological status.   Pressure or swelling of the brain (intracranial pressure).   For seizures.   Severe brain injury cases may need surgery to:   Control bleeding.   Relieve pressure on the brain.   Remove objects from the brain that result from a penetrating injury.   Repair the skull from an injury.   Long term treatment of a brain injury can involve rehabilitation work such as:   Physical therapy.   Occupational therapy.     Speech therapy.  PROGNOSIS The outcome of TBI depends on the cause of the injury, location, severity, and extent of neurological damage. Outcomes range from good recovery to death. Long term consequences of a TBI can include:  Difficulty concentrating or having a short attention span.   Change in personality.   Irritability.   Headaches.   Blurry vision.   Sleepiness.   Depression.   Unsteadiness that makes walking or standing hard to do.  For more information and support, contact: [employer] [facility] Neurological Disorders and Stroke.  Document Released: 08/30/2002 Document Revised: 08/29/2011 Document Reviewed: 09/07/2009 [facility] Patient Information 2012 [place], [place].

## 2012-05-01 ENCOUNTER — Encounter (HOSPITAL_BASED_OUTPATIENT_CLINIC_OR_DEPARTMENT_OTHER): Payer: Self-pay | Admitting: Emergency Medicine

## 2012-05-01 ENCOUNTER — Emergency Department (HOSPITAL_BASED_OUTPATIENT_CLINIC_OR_DEPARTMENT_OTHER)
Admission: EM | Admit: 2012-05-01 | Discharge: 2012-05-02 | Disposition: A | Discharge status: Home / Self Care | Payer: Medicare Other | Attending: Emergency Medicine | Admitting: Emergency Medicine

## 2012-05-01 DIAGNOSIS — R5381 Other malaise: Secondary | ICD-10-CM | POA: Insufficient documentation

## 2012-05-01 DIAGNOSIS — J449 Chronic obstructive pulmonary disease, unspecified: Secondary | ICD-10-CM | POA: Insufficient documentation

## 2012-05-01 DIAGNOSIS — N390 Urinary tract infection, site not specified: Secondary | ICD-10-CM | POA: Insufficient documentation

## 2012-05-01 DIAGNOSIS — Z87891 Personal history of nicotine dependence: Secondary | ICD-10-CM | POA: Insufficient documentation

## 2012-05-01 DIAGNOSIS — Z85118 Personal history of other malignant neoplasm of bronchus and lung: Secondary | ICD-10-CM | POA: Insufficient documentation

## 2012-05-01 DIAGNOSIS — J4489 Other specified chronic obstructive pulmonary disease: Secondary | ICD-10-CM | POA: Insufficient documentation

## 2012-05-01 DIAGNOSIS — R531 Weakness: Secondary | ICD-10-CM

## 2012-05-01 HISTORY — DX: Essential tremor: G25.0

## 2012-05-01 HISTORY — DX: Chronic obstructive pulmonary disease, unspecified: J44.9

## 2012-05-01 NOTE — ED Notes (Signed)
Pt stood from w/c-assisted with undress and to stretcher by EMT-EKG was attempted-artifact due to pt has right side brain stimulator to tx tremors r/t Parkinsons

## 2012-05-01 NOTE — ED Notes (Signed)
Daughter reports weakness and loss of balance that has been going on for "months."  Sts she has seen "doctor after doctor" and they have not found anything.  States her weakness and balance issues are much worse this evening. Memory has been declining lately.  Daughter sts she just "ain't doing right."

## 2012-05-01 NOTE — ED Provider Notes (Signed)
History     CSN: [id number]  Arrival date & time 05/01/12  2215   First MD Initiated Contact with Patient 05/01/12 2351      Chief Complaint  Patient presents with  . Weakness    (Consider location/radiation/quality/duration/timing/severity/associated sxs/prior treatment) HPI This is a 71 year old white female with about a 5 month history of generalized weakness, difficulty ambulating, memory problems and dysarthria. She acutely worsened today and has been unable to stand without assistance. She denies headache, chest pain, abdominal pain, nausea, vomiting, diarrhea, dysuria, fever or chills. 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 She attributes her dysarthria to the brain stimulator. She states she is short of breath with exertion but this is consistent with her chronic obstructive pulmonary disease. Overall her symptoms are moderate to severe. There are no known mitigating or exacerbating factors.  Past Medical History  Diagnosis Date  . Lung cancer     lung ca dx 04/2009  . Benign essential tremor   . Lung cancer   . COPD (chronic obstructive pulmonary disease)     Past Surgical History  Procedure Date  . Deep brain stimulator placement   . Mouth surgery   . Breast surgery   . Appendectomy   . Ovary surgery     No family history on file.  History  Substance Use Topics  . Smoking status: Former Games developer  . Smokeless tobacco: Not on file  . Alcohol Use: No    OB History    Grav Para Term Preterm Abortions TAB SAB Ect Mult Living                  Review of Systems  All other systems reviewed and are negative.    Allergies  Budesonide-formoterol fumarate and Iohexol  Home Medications   Current Outpatient Rx  Name Route Sig Dispense Refill  . VITAMIN D PO Oral Take by mouth.    . METOPROLOL SUCCINATE ER PO Oral Take 1 tablet by mouth 2 (two) times daily.    . RED YEAST RICE PO Oral Take by mouth.      BP 137/75  Pulse 129   Temp 98 F (36.7 C) (Oral)  Resp 16  Ht 5\' 4"  (1.626 m)  Wt 139 lb (63.05 kg)  BMI 23.86 kg/m2  SpO2 94%  Physical Exam General: Well-developed, well-nourished female in no acute distress; appearance consistent with age of record HENT: normocephalic, atraumatic Eyes: pupils equal round and reactive to light; extraocular muscles intact; arcus senilis bilaterally Neck: supple Heart: regular rate and rhythm; tachycardia; occasional ectopy; systolic murmur Lungs: clear to auscultation bilaterally, distant sounds Abdomen: soft; nondistended; nontender; bowel sounds present Extremities: No deformity; full range of motion; pulses normal Neurologic: Awake, alert and oriented; motor function intact in all extremities and symmetric; no facial droop; negative Romberg; normal finger to nose Skin: Warm and dry Psychiatric: Normal mood and affect    ED Course  Procedures (including critical care time)    MDM   Nursing notes and vitals signs, including pulse oximetry, reviewed.  Summary of this visit's results, reviewed by myself:  Labs:  Results for orders placed during the hospital encounter of 05/01/12  URINALYSIS, ROUTINE W REFLEX MICROSCOPIC      Component Value Range   Color, Urine YELLOW  YELLOW   APPearance CLOUDY (*) CLEAR   Specific Gravity, Urine 1.006  1.005 - 1.030   pH 6.0  5.0 - 8.0   Glucose, UA NEGATIVE  NEGATIVE mg/dL   Hgb urine dipstick LARGE (*) NEGATIVE   Bilirubin Urine NEGATIVE  NEGATIVE   Ketones, ur NEGATIVE  NEGATIVE mg/dL   Protein, ur NEGATIVE  NEGATIVE mg/dL   Urobilinogen, UA 0.2  0.0 - 1.0 mg/dL   Nitrite POSITIVE (*) NEGATIVE   Leukocytes, UA LARGE (*) NEGATIVE  BASIC METABOLIC PANEL      Component Value Range   Sodium 137  135 - 145 mEq/L   Potassium 3.8  3.5 - 5.1 mEq/L   Chloride 101  96 - 112 mEq/L   CO2 26  19 - 32 mEq/L   Glucose, Bld 172 (*) 70 - 99 mg/dL   BUN 9  6 - 23 mg/dL   Creatinine, Ser 1.32  0.50 - 1.10 mg/dL   Calcium  44.0  8.4 - 10.5 mg/dL   GFR calc non Af Amer 73 (*) >90 mL/min   GFR calc Af Amer 85 (*) >90 mL/min  CBC WITH DIFFERENTIAL      Component Value Range   WBC 9.0  4.0 - 10.5 K/uL   RBC 3.98  3.87 - 5.11 MIL/uL   Hemoglobin 12.4  12.0 - 15.0 g/dL   HCT 10.2  72.5 - 36.6 %   MCV 90.7  78.0 - 100.0 fL   MCH 31.2  26.0 - 34.0 pg   MCHC 34.3  30.0 - 36.0 g/dL   RDW 44.0  34.7 - 42.5 %   Platelets 185  150 - 400 K/uL   Neutrophils Relative 80 (*) 43 - 77 %   Lymphocytes Relative 11 (*) 12 - 46 %   Monocytes Relative 9  3 - 12 %   Eosinophils Relative 0  0 - 5 %   Basophils Relative 0  0 - 1 %   Neutro Abs 7.2  1.7 - 7.7 K/uL   Lymphs Abs 1.0  0.7 - 4.0 K/uL   Monocytes Absolute 0.8  0.1 - 1.0 K/uL   Eosinophils Absolute 0.0  0.0 - 0.7 K/uL   Basophils Absolute 0.0  0.0 - 0.1 K/uL   Smear Review LARGE PLATELETS PRESENT    TROPONIN I      Component Value Range   Troponin I <0.30  <0.30 ng/mL  URINE MICROSCOPIC-ADD ON      Component Value Range   Squamous Epithelial / LPF FEW (*) RARE   WBC, UA TOO NUMEROUS TO COUNT  <3 WBC/hpf   RBC / HPF 7-10  <3 RBC/hpf   Bacteria, UA MANY (*) RARE    Imaging Studies: No results found.    EKG Interpretation:  Date & Time: 05/01/2012 10:44 PM  Rate: 125  Rhythm: sinus tachycardia  QRS Axis: normal  Intervals: normal  ST/T Wave abnormalities: nonspecific ST/T changes  Conduction Disutrbances:Incomplete right bundle-branch block  Narrative Interpretation: Interpretation limited by artifact  Old EKG Reviewed: Rate is faster; artifact not previously present  12:42 AM The patient's daughter reviewed her medication organizer and noted that the patient had not taken any of her medications yesterday. This may explain the patient's tachycardia, and the tachycardia may explain the patient's acute weakness.  1:55 AM Patient able to ambulate after her heart rate slowed with IV Lopressor. Patient and family were advised of the importance of taking  medications regularly particularly beta blockers. We'll treat her for her urinary tract infection.      Hanley Seamen, MD 05/02/12 [phone]

## 2012-05-02 LAB — BASIC METABOLIC PANEL
BUN: 9 mg/dL (ref 6–23)
Chloride: 101 mEq/L (ref 96–112)
GFR calc Af Amer: 85 mL/min — ABNORMAL LOW (ref >90)
Potassium: 3.8 mEq/L (ref 3.5–5.1)

## 2012-05-02 LAB — CBC WITH DIFFERENTIAL/PLATELET
Basophils Relative: 0 % (ref 0–1)
Eosinophils Relative: 0 % (ref 0–5)
HCT: 36.1 % (ref 36.0–46.0)
Hemoglobin: 12.4 g/dL (ref 12.0–15.0)
Lymphocytes Relative: 11 % — ABNORMAL LOW (ref 12–46)
Monocytes Relative: 9 % (ref 3–12)
Neutro Abs: 7.2 10*3/uL (ref 1.7–7.7)
WBC: 9 10*3/uL (ref 4.0–10.5)

## 2012-05-02 LAB — URINE MICROSCOPIC-ADD ON

## 2012-05-02 LAB — URINALYSIS, ROUTINE W REFLEX MICROSCOPIC
Bilirubin Urine: NEGATIVE
Nitrite: POSITIVE — AB
Specific Gravity, Urine: 1.006 (ref 1.005–1.030)
pH: 6 (ref 5.0–8.0)

## 2012-05-02 MED ORDER — METOPROLOL TARTRATE 1 MG/ML IV SOLN
5.0000 mg | Freq: Once | INTRAVENOUS | Status: AC
  Administered 2012-05-02: 5 mg via INTRAVENOUS
  Filled 2012-05-02: qty 5

## 2012-05-02 MED ORDER — CIPROFLOXACIN HCL 500 MG PO TABS
500.0000 mg | ORAL_TABLET | Freq: Once | ORAL | Status: AC
  Administered 2012-05-02: 500 mg via ORAL
  Filled 2012-05-02: qty 1

## 2012-05-02 MED ORDER — CEFTRIAXONE SODIUM 1 G IJ SOLR: 1.0000 g | Freq: Once | INTRAMUSCULAR | Status: DC

## 2012-05-02 MED ORDER — SODIUM CHLORIDE 0.9 % IV SOLN
INTRAVENOUS | Status: DC
  Administered 2012-05-02: via INTRAVENOUS

## 2012-05-02 MED ORDER — CIPROFLOXACIN HCL 250 MG PO TABS: 250.0000 mg | ORAL_TABLET | Freq: Two times a day (BID) | ORAL | Status: AC

## 2012-05-02 NOTE — ED Notes (Signed)
Pt ambulated in hallway holding on to nurse's hand.  Slightly unbalanced when turning, but able to ambulate.

## 2012-05-02 NOTE — ED Notes (Signed)
MD at bedside. 

## 2012-05-02 NOTE — ED Notes (Signed)
Family discussed meds with pt and they discovered that pt has NOT taken any of her meds today.  Daughter saw the med box still full of "Friday's" pills.  Dr. Read Drivers notified.

## 2012-05-04 LAB — URINE CULTURE

## 2012-05-05 NOTE — ED Notes (Signed)
+   urine  Patient treated appropriately -sensitive to same-chart appended per protocol MD.  

## 2012-05-06 NOTE — ED Notes (Signed)
+   urine Patient treated with Cipro-sensitive to same-chart appended per protocol MD. 

## 2013-02-16 ENCOUNTER — Ambulatory Visit: Payer: Self-pay | Admitting: Podiatry

## 2015-01-20 ENCOUNTER — Ambulatory Visit (INDEPENDENT_AMBULATORY_CARE_PROVIDER_SITE_OTHER): Payer: Medicare Other | Admitting: Physician Assistant

## 2015-01-20 ENCOUNTER — Encounter: Payer: Self-pay | Admitting: Physician Assistant

## 2015-01-20 VITALS — BP 145/75 | HR 137 | Ht 64.0 in | Wt 134.0 lb

## 2015-01-20 DIAGNOSIS — Z23 Encounter for immunization: Secondary | ICD-10-CM

## 2015-01-20 DIAGNOSIS — R000 Tachycardia, unspecified: Secondary | ICD-10-CM

## 2015-01-20 DIAGNOSIS — R600 Localized edema: Secondary | ICD-10-CM | POA: Insufficient documentation

## 2015-01-20 DIAGNOSIS — J449 Chronic obstructive pulmonary disease, unspecified: Secondary | ICD-10-CM

## 2015-01-20 DIAGNOSIS — Z79899 Other long term (current) drug therapy: Secondary | ICD-10-CM | POA: Diagnosis not present

## 2015-01-20 DIAGNOSIS — G250 Essential tremor: Secondary | ICD-10-CM

## 2015-01-20 MED ORDER — PRIMIDONE 250 MG PO TABS: 375.0000 mg | ORAL_TABLET | ORAL | Status: DC

## 2015-01-20 MED ORDER — AMBULATORY NON FORMULARY MEDICATION: Status: DC

## 2015-01-20 MED ORDER — HYDROCHLOROTHIAZIDE 12.5 MG PO TABS: 12.5000 mg | ORAL_TABLET | Freq: Every day | ORAL | Status: DC

## 2015-01-20 MED ORDER — METOPROLOL TARTRATE 50 MG PO TABS: 50.0000 mg | ORAL_TABLET | Freq: Two times a day (BID) | ORAL | Status: DC

## 2015-01-20 MED ORDER — BUDESONIDE-FORMOTEROL FUMARATE 160-4.5 MCG/ACT IN AERO: 1.0000 | INHALATION_SPRAY | Freq: Two times a day (BID) | RESPIRATORY_TRACT | Status: DC

## 2015-01-20 NOTE — Progress Notes (Addendum)
   Subjective:    Patient ID: Sherri Singh, female    DOB: [birth year], 74 y.o.   MRN: [id number]  HPI Pt is a 74 yo female who presents to the clinic to establish care.   .. Active Ambulatory Problems    Diagnosis Date Noted  . CARCINOMA, LUNG, SMALL CELL 04/27/2009  . TOBACCO ABUSE 05/11/2009  . 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 Tachycardia 01/20/2015  . Bilateral leg edema 01/20/2015  . Essential tremor 01/22/2015   Resolved Ambulatory Problems    Diagnosis Date Noted  . No Resolved Ambulatory Problems   Past Medical History  Diagnosis Date  . Lung cancer   . Benign essential tremor   . Lung cancer   . COPD (chronic obstructive pulmonary disease)    .Marland Kitchen Family History  Problem Relation Age of Onset  . Cancer Mother     lung  . Heart attack Mother   . Diabetes Mother    .Marland Kitchen History   Social History  . Marital Status: Single    Spouse Name: N/A  . Number of Children: N/A  . Years of Education: N/A   Occupational History  . Not on file.   Social History Main Topics  . Smoking status: Former Smoker -- 29 years    Quit date: 09/23/1997  . Smokeless tobacco: Not on file  . Alcohol Use: No  . Drug Use: No  . Sexual Activity: No   Other Topics Concern  . Not on file   Social History Narrative   Pt has been out of all of her medications. She is shaking bad, her heart is racing and her feet are swelling bilaterally. She comes to establish care. Feet swelling for last few weeks. No other swelling. No increased SOB, CP, wheezing.    Review of Systems  All other systems reviewed and are negative.      Objective:   Physical Exam  Constitutional: She appears well-developed and well-nourished.  HENT:  Head: Normocephalic and atraumatic.  Cardiovascular: Regular rhythm and normal heart sounds.   Tachycardia at 137  Pulmonary/Chest: Effort normal and breath sounds  normal. She has no wheezes.  Neurological:  Essential tremor noted of bilateral upper extremities today.   Psychiatric: She has a normal mood and affect. Her behavior is normal.          Assessment & Plan:  HTN/tachycardia- not on medications and very out of control today. Restarted medications today and will follow up with vitals in one week. Refilled metoprolol '50mg'$  bid. Will adjust accordingly.   Essential tremor- out of primadone and certainly BB was probably helping as well.. Will refill and access symptoms.   Bilateral feet and ankle edema- certainly being out of medications could cause, heat changes, and some venous stasis. rx given for compression stockings. HCTZ given to use as needed for excessive swelling. Elevate legs often.   COPD- given prevnar today. Refilled symbicort. No problems or concerns.   Interested in shingles but not to have until 4 weeks apart. Will print rx at next follow up.   Discussed mammogram and bone density- pt declines for today but would like to get in the future.

## 2015-01-20 NOTE — Patient Instructions (Addendum)
Restart medications.  Follow up in 1 week for BP and pulse recheck.  elevate legs often.  Compression stockings  As needed dirurteic to use to help with swelling.   Edema Edema is an abnormal buildup of fluids in your bodytissues. Edema is somewhatdependent on gravity to pull the fluid to the lowest place in your body. That makes the condition more common in the legs and thighs (lower extremities). Painless swelling of the feet and ankles is common and becomes more likely as you get older. It is also common in looser tissues, like around your eyes.  When the affected area is squeezed, the fluid may move out of that spot and leave a dent for a few moments. This dent is called pitting.  CAUSES  There are many possible causes of edema. Eating too much salt and being on your feet or sitting for a long time can cause edema in your legs and ankles. Hot weather may make edema worse. Common medical causes of edema include:  Heart failure.  Liver disease.  Kidney disease.  Weak blood vessels in your legs.  Cancer.  An injury.  Pregnancy.  Some medications.  Obesity. SYMPTOMS  Edema is usually painless.Your skin may look swollen or shiny.  DIAGNOSIS  Your health care provider may be able to diagnose edema by asking about your medical history and doing a physical exam. You may need to have tests such as X-rays, an electrocardiogram, or blood tests to check for medical conditions that may cause edema.  TREATMENT  Edema treatment depends on the cause. If you have heart, liver, or kidney disease, you need the treatment appropriate for these conditions. General treatment may include:  Elevation of the affected body part above the level of your heart.  Compression of the affected body part. Pressure from elastic bandages or support stockings squeezes the tissues and forces fluid back into the blood vessels. This keeps fluid from entering the tissues.  Restriction of fluid and salt  intake.  Use of a water pill (diuretic). These medications are appropriate only for some types of edema. They pull fluid out of your body and make you urinate more often. This gets rid of fluid and reduces swelling, but diuretics can have side effects. Only use diuretics as directed by your health care provider. HOME CARE INSTRUCTIONS   Keep the affected body part above the level of your heart when you are lying down.   Do not sit still or stand for prolonged periods.   Do not put anything directly under your knees when lying down.  Do not wear constricting clothing or garters on your upper legs.   Exercise your legs to work the fluid back into your blood vessels. This may help the swelling go down.   Wear elastic bandages or support stockings to reduce ankle swelling as directed by your health care provider.   Eat a low-salt diet to reduce fluid if your health care provider recommends it.   Only take medicines as directed by your health care provider. SEEK MEDICAL CARE IF:   Your edema is not responding to treatment.  You have heart, liver, or kidney disease and notice symptoms of edema.  You have edema in your legs that does not improve after elevating them.   You have sudden and unexplained weight gain. SEEK IMMEDIATE MEDICAL CARE IF:   You develop shortness of breath or chest pain.   You cannot breathe when you lie down.  You develop pain, redness, or  warmth in the swollen areas.   You have heart, liver, or kidney disease and suddenly get edema.  You have a fever and your symptoms suddenly get worse. MAKE SURE YOU:   Understand these instructions.  Will watch your condition.  Will get help right away if you are not doing well or get worse. Document Released: 09/09/2005 Document Revised: 01/24/2014 Document Reviewed: 07/02/2013 [facility] Patient Information 2015 [place], [place]. This information is not intended to replace advice given to you by your health  care provider. Make sure you discuss any questions you have with your health care provider.

## 2015-01-21 LAB — COMPLETE METABOLIC PANEL WITH GFR
ALK PHOS: 97 U/L (ref 39–117)
ALT: 20 U/L (ref 0–35)
AST: 19 U/L (ref 0–37)
Albumin: 4.4 g/dL (ref 3.5–5.2)
BILIRUBIN TOTAL: 0.5 mg/dL (ref 0.2–1.2)
BUN: 9 mg/dL (ref 6–23)
CO2: 26 meq/L (ref 19–32)
CREATININE: 0.71 mg/dL (ref 0.50–1.10)
Calcium: 10.2 mg/dL (ref 8.4–10.5)
Chloride: 104 mEq/L (ref 96–112)
GFR, Est Non African American: 85 mL/min
GLUCOSE: 110 mg/dL — AB (ref 70–99)
Potassium: 3.8 mEq/L (ref 3.5–5.3)
Sodium: 141 mEq/L (ref 135–145)
TOTAL PROTEIN: 6.8 g/dL (ref 6.0–8.3)

## 2015-01-21 LAB — TSH: TSH: 1.653 u[IU]/mL (ref 0.350–4.500)

## 2015-01-22 ENCOUNTER — Telehealth: Payer: Self-pay | Admitting: Physician Assistant

## 2015-01-22 DIAGNOSIS — G250 Essential tremor: Secondary | ICD-10-CM | POA: Insufficient documentation

## 2015-01-22 NOTE — Telephone Encounter (Signed)
Stated could not result note for labs. Please go to labs and make sure pt is called with lab result.

## 2015-01-23 NOTE — Telephone Encounter (Signed)
Attempted to contact Pt regarding lab results, no answer and no voicemail set up.

## 2015-01-27 ENCOUNTER — Ambulatory Visit (INDEPENDENT_AMBULATORY_CARE_PROVIDER_SITE_OTHER): Payer: Medicare Other | Admitting: Physician Assistant

## 2015-01-27 VITALS — BP 113/62 | HR 76

## 2015-01-27 DIAGNOSIS — I10 Essential (primary) hypertension: Secondary | ICD-10-CM | POA: Diagnosis not present

## 2015-01-27 DIAGNOSIS — R000 Tachycardia, unspecified: Secondary | ICD-10-CM

## 2015-01-27 NOTE — Progress Notes (Signed)
   Subjective:    Patient ID: Sherri Singh, female    DOB: 1941/06/28, 74 y.o.   MRN: [id number]  HPI  Hypertension - Sherri Singh is here for a blood pressure check. She has had some lightheadedness since restarting her blood pressure medications. Denies chest pain, shortness of breath or headaches.   Review of Systems     Objective:   Physical Exam        Assessment & Plan:  Hypertension/tachycardia - BP and pulse dropped signifcantly. Take half a tablet of Metoprolol 50 mg (total of 25 mg) bid. Follow up in 1 week for blood pressure at a nurse visit. Per Iran Planas PA-C

## 2015-01-27 NOTE — Patient Instructions (Signed)
Take half a tablet of Metoprolol 50 mg twice daily. Recheck blood pressure in one week at a nurse visit.

## 2015-02-03 ENCOUNTER — Ambulatory Visit (INDEPENDENT_AMBULATORY_CARE_PROVIDER_SITE_OTHER): Payer: Medicare Other | Admitting: Family Medicine

## 2015-02-03 ENCOUNTER — Ambulatory Visit: Payer: Medicare Other

## 2015-02-03 VITALS — BP 119/66 | HR 84

## 2015-02-03 DIAGNOSIS — I10 Essential (primary) hypertension: Secondary | ICD-10-CM | POA: Diagnosis not present

## 2015-02-03 DIAGNOSIS — R000 Tachycardia, unspecified: Secondary | ICD-10-CM

## 2015-02-03 NOTE — Progress Notes (Signed)
   Subjective:    Patient ID: Sherri Singh, female    DOB: [birth year], 74 y.o.   MRN: [id number] Pt in today to recheck BP.  Jade instructed her to half her metoprolol ('25mg'$  total) bid and to come in in one week to check.  Her BP was 119/66 pulse 84 today so I advised her to stay on that same dose.  Beatris Ship, CMA HPI    Review of Systems     Objective:   Physical Exam        Assessment & Plan:  HTN /tachycardia - well controlled. Continue curren regimen. F/U in 4 months.    Beatrice Lecher, MD

## 2015-02-14 ENCOUNTER — Other Ambulatory Visit: Payer: Self-pay | Admitting: *Deleted

## 2015-02-14 MED ORDER — METOPROLOL TARTRATE 25 MG PO TABS: 25.0000 mg | ORAL_TABLET | Freq: Two times a day (BID) | ORAL | Status: DC

## 2015-02-15 ENCOUNTER — Ambulatory Visit: Payer: Medicare Other | Admitting: Family Medicine

## 2015-02-27 ENCOUNTER — Telehealth: Payer: Self-pay | Admitting: Family Medicine

## 2015-02-27 ENCOUNTER — Other Ambulatory Visit: Payer: Self-pay | Admitting: *Deleted

## 2015-02-27 ENCOUNTER — Ambulatory Visit: Payer: Medicare Other | Admitting: Family Medicine

## 2015-02-27 MED ORDER — HYDROCHLOROTHIAZIDE 12.5 MG PO TABS: 12.5000 mg | ORAL_TABLET | Freq: Every day | ORAL | Status: DC

## 2015-02-27 NOTE — Telephone Encounter (Signed)
Per patient's oncologist, she has been taking 2 tabs twice daily of the Lopressor. She needs to have this updated please.  Send to [facility] here in [place].  thanks

## 2015-02-27 NOTE — Telephone Encounter (Signed)
This med was sent last month with 3 refills.  And was sent to [facility] so not sure what she needs.

## 2015-03-01 NOTE — Addendum Note (Signed)
Addended by: Teddy Spike on: 03/01/2015 01:59 PM   Modules accepted: Orders, Medications

## 2015-03-13 ENCOUNTER — Telehealth: Payer: Self-pay | Admitting: Family Medicine

## 2015-03-13 NOTE — Telephone Encounter (Signed)
Requested patient assistance form filled out for spriva, I see no history of spiriva here or with [facility].  F/U appt needed

## 2015-03-17 ENCOUNTER — Encounter: Payer: Self-pay | Admitting: Family Medicine

## 2015-03-17 ENCOUNTER — Ambulatory Visit (INDEPENDENT_AMBULATORY_CARE_PROVIDER_SITE_OTHER): Payer: Medicare Other | Admitting: Family Medicine

## 2015-03-17 VITALS — BP 148/64 | HR 68 | Ht 64.0 in | Wt 136.0 lb

## 2015-03-17 DIAGNOSIS — J449 Chronic obstructive pulmonary disease, unspecified: Secondary | ICD-10-CM

## 2015-03-17 DIAGNOSIS — R600 Localized edema: Secondary | ICD-10-CM | POA: Diagnosis not present

## 2015-03-17 MED ORDER — FUROSEMIDE 20 MG PO TABS: 20.0000 mg | ORAL_TABLET | Freq: Every day | ORAL | Status: DC | PRN

## 2015-03-17 MED ORDER — TIOTROPIUM BROMIDE MONOHYDRATE 18 MCG IN CAPS: 18.0000 ug | ORAL_CAPSULE | Freq: Two times a day (BID) | RESPIRATORY_TRACT | Status: DC

## 2015-03-17 MED ORDER — TIOTROPIUM BROMIDE MONOHYDRATE 2.5 MCG/ACT IN AERS: 1.0000 | INHALATION_SPRAY | Freq: Two times a day (BID) | RESPIRATORY_TRACT | Status: DC

## 2015-03-17 NOTE — Progress Notes (Signed)
CC: Sherri Singh is a 74 y.o. female is here for No chief complaint on file.   Subjective: HPI:  Patient reports a long-standing history of COPD with the use of Spiriva respimat on a daily basis for at least the last year. She is not sure who was prescribing it but one of her former providers was giving her prescription for this. She has the ability to get this medication for free from a patient assistance program and needs a new medication helps significantly with improving her shortness of breath without the medication. She denies any known side effects such as urinary retention or dry mouth.  She complains of bilateral swelling that has been present for matter of months. Does not seem to be getting better or worse other than improving when legs are elevated and worse when up walking around. No interventions as of yet other than hydrochlorothiazide which seems to be ineffective. She denies swelling elsewhere or orthopnea. Denies chest pain, PND, irregular heartbeat nor urinary retention.  Review Of Systems Outlined In HPI  Past Medical History  Diagnosis Date  . Lung cancer     lung ca dx 04/2009  . Benign essential tremor   . Lung cancer   . COPD (chronic obstructive pulmonary disease)     Past Surgical History  Procedure Laterality Date  . Deep brain stimulator placement    . Mouth surgery    . Breast surgery    . Appendectomy    . Ovary surgery     Family History  Problem Relation Age of Onset  . Cancer Mother     lung  . Heart attack Mother   . Diabetes Mother     History   Social History  . Marital Status: Single    Spouse Name: N/A  . Number of Children: N/A  . Years of Education: N/A   Occupational History  . Not on file.   Social History Main Topics  . Smoking status: Former Smoker -- 10 years    Quit date: 09/23/1997  . Smokeless tobacco: Not on file  . Alcohol Use: No  . Drug Use: No  . Sexual Activity: No   Other Topics Concern  . Not on file    Social History Narrative     Objective: BP 148/64 mmHg  Pulse 68  Ht '5\' 4"'$  (1.626 m)  Wt 136 lb (61.689 kg)  BMI 23.33 kg/m2  General: Alert and Oriented, No Acute Distress HEENT: Pupils equal, round, reactive to light. Conjunctivae clear.  Moist mucous membranes Lungs: Clear to auscultation bilaterally, no wheezing/ronchi/rales.  Comfortable work of breathing. Good air movement. Cardiac: Regular rate and rhythm. Normal S1/S2.  No murmurs, rubs, nor gallops.   Extremities: 1+ nonpitting edema from the ankles distally..  Strong peripheral pulses.  Mental Status: No depression, anxiety, nor agitation. Skin: Warm and dry.  Assessment & Plan: Diagnoses and all orders for this visit:  COPD (chronic obstructive pulmonary disease) with chronic bronchitis  Bilateral leg edema Orders: -     furosemide (LASIX) 20 MG tablet; Take 1 tablet (20 mg total) by mouth daily as needed (swelling of feet).  Other orders -     tiotropium (SPIRIVA HANDIHALER) 18 MCG inhalation capsule; Place 1 capsule (18 mcg total) into inhaler and inhale 2 (two) times daily. -     Tiotropium Bromide Monohydrate (SPIRIVA RESPIMAT) 2.5 MCG/ACT AERS; Inhale 1 puff into the lungs 2 (two) times daily.    bilateral leg edema: Uncontrolled chronic condition,  Begin as needed Lasix. COPD: Controlled with Spiriva. Refills were provided, the patient assistance program was specifically requesting a prescription for the capsule form of Spiriva, I've written a prescription for this and for the respimat formulation with a note stating that the patient would prefer the latter of the 2  Return if symptoms worsen or fail to improve.

## 2015-03-20 ENCOUNTER — Telehealth: Payer: Self-pay | Admitting: Family Medicine

## 2015-03-20 MED ORDER — BUDESONIDE-FORMOTEROL FUMARATE 160-4.5 MCG/ACT IN AERO: 1.0000 | INHALATION_SPRAY | Freq: Two times a day (BID) | RESPIRATORY_TRACT | Status: DC

## 2015-03-20 NOTE — Telephone Encounter (Signed)
Sherri Singh, Rx on your desk since i'm not sure which mail order pharmacy she'll want to use.

## 2015-03-20 NOTE — Telephone Encounter (Signed)
Pt needs new Rx for Symbicort if she is to continue on it, OK to send to mail order pharmacy?

## 2015-03-20 NOTE — Telephone Encounter (Signed)
Continue with both inhalers.

## 2015-03-20 NOTE — Telephone Encounter (Signed)
Attempted to contact Pt, no answer. No voicemail set up. Will send Rx to local [facility] on file for now.

## 2015-03-20 NOTE — Telephone Encounter (Signed)
Patient walked into clinic this am with her Rx for Symbicort 160/4.31mg inhaler that she has been taking and her Spiriva 2.564m inhaler that was given at her last visit with Dr. HoIleene RubensPt questions if she is supposed to continue with the Symbicort inhaler or switch to the Spiriva. Will route for review then contact Pt.

## 2015-03-21 ENCOUNTER — Other Ambulatory Visit: Payer: Self-pay | Admitting: *Deleted

## 2015-03-21 MED ORDER — BUDESONIDE-FORMOTEROL FUMARATE 160-4.5 MCG/ACT IN AERO: 1.0000 | INHALATION_SPRAY | Freq: Two times a day (BID) | RESPIRATORY_TRACT | Status: DC

## 2015-03-21 NOTE — Progress Notes (Signed)
Pt uses [facility] for her mail order pharmacy.  Fax # is [phone].  Her acct # is [id number]

## 2015-03-31 ENCOUNTER — Other Ambulatory Visit: Payer: Self-pay | Admitting: Physician Assistant

## 2015-04-12 ENCOUNTER — Ambulatory Visit (INDEPENDENT_AMBULATORY_CARE_PROVIDER_SITE_OTHER): Payer: Medicare Other

## 2015-04-12 ENCOUNTER — Encounter: Payer: Self-pay | Admitting: Family Medicine

## 2015-04-12 ENCOUNTER — Ambulatory Visit (INDEPENDENT_AMBULATORY_CARE_PROVIDER_SITE_OTHER): Payer: Medicare Other | Admitting: Family Medicine

## 2015-04-12 VITALS — BP 132/75 | HR 98 | Wt 132.0 lb

## 2015-04-12 DIAGNOSIS — M533 Sacrococcygeal disorders, not elsewhere classified: Secondary | ICD-10-CM | POA: Diagnosis not present

## 2015-04-12 DIAGNOSIS — M8588 Other specified disorders of bone density and structure, other site: Secondary | ICD-10-CM | POA: Diagnosis not present

## 2015-04-12 DIAGNOSIS — J449 Chronic obstructive pulmonary disease, unspecified: Secondary | ICD-10-CM

## 2015-04-12 DIAGNOSIS — M545 Low back pain: Secondary | ICD-10-CM

## 2015-04-12 DIAGNOSIS — S3992XA Unspecified injury of lower back, initial encounter: Secondary | ICD-10-CM | POA: Diagnosis not present

## 2015-04-12 MED ORDER — NAPROXEN 250 MG PO TABS: 250.0000 mg | ORAL_TABLET | Freq: Two times a day (BID) | ORAL | Status: DC

## 2015-04-12 MED ORDER — POLYETHYLENE GLYCOL 3350 17 GM/SCOOP PO POWD: 17.0000 g | Freq: Every day | ORAL | Status: DC

## 2015-04-12 MED ORDER — TRAMADOL HCL 50 MG PO TABS: 50.0000 mg | ORAL_TABLET | Freq: Three times a day (TID) | ORAL | Status: DC | PRN

## 2015-04-12 MED ORDER — BUDESONIDE-FORMOTEROL FUMARATE 160-4.5 MCG/ACT IN AERO: 2.0000 | INHALATION_SPRAY | Freq: Two times a day (BID) | RESPIRATORY_TRACT | Status: DC

## 2015-04-12 NOTE — Progress Notes (Signed)
Sherri Singh is a 74 y.o. female who presents to [facility]  today for buttocks pain. Patient slipped off of the toilet in a [employer] bathroom 2 days ago landing on her buttocks. She notes pain in her buttocks along the coccyx. She notes pain is worse with ambulation and a bowel movement. She denies any numbness weakness or radiating pain. No bowel or bladder dysfunctions fevers or chills. No vomiting or diarrhea. She she has tried Tylenol which only helps a little to take care of her severe pain.  She is elderly, frail, and in poor health. She lives alone.   Past Medical History  Diagnosis Date  . Lung cancer     lung ca dx 04/2009  . Benign essential tremor   . Lung cancer   . COPD (chronic obstructive pulmonary disease)    Past Surgical History  Procedure Laterality Date  . Deep brain stimulator placement    . Mouth surgery    . Breast surgery    . Appendectomy    . Ovary surgery     History  Substance Use Topics  . Smoking status: Former Smoker -- 46 years    Quit date: 09/23/1997  . Smokeless tobacco: Not on file  . Alcohol Use: No   ROS as above Medications: Current Outpatient Prescriptions  Medication Sig Dispense Refill  . AMBULATORY NON FORMULARY MEDICATION Compression stockings bilateral knee highs  for bilateral leg/ankle edema 2 Device 0  . BIOTIN 5000 PO Take by mouth daily.    . budesonide-formoterol (SYMBICORT) 160-4.5 MCG/ACT inhaler Inhale 1 puff into the lungs 2 (two) times daily. 3 Inhaler 1  . Cholecalciferol (VITAMIN D PO) Take by mouth.    . furosemide (LASIX) 20 MG tablet Take 1 tablet (20 mg total) by mouth daily as needed (swelling of feet). 30 tablet 1  . hydrochlorothiazide (HYDRODIURIL) 12.5 MG tablet Take 1 tablet (12.5 mg total) by mouth daily. 30 tablet 2  . metoprolol (LOPRESSOR) 50 MG tablet     . metoprolol (LOPRESSOR) 50 MG tablet Take 0.5 tablets (25 mg total) by mouth 2 (two) times daily. 60 tablet 2   . primidone (MYSOLINE) 250 MG tablet Take 1.5 tablets (375 mg total) by mouth 1 day or 1 dose. 45 tablet 5  . Tiotropium Bromide Monohydrate (SPIRIVA RESPIMAT) 2.5 MCG/ACT AERS Inhale 1 puff into the lungs 2 (two) times daily. 12 g 3  . vitamin B-12 (CYANOCOBALAMIN) 1000 MCG tablet Take 1,000 mcg by mouth daily.    . naproxen (NAPROSYN) 250 MG tablet Take 1 tablet (250 mg total) by mouth 2 (two) times daily with a meal. 20 tablet 0  . polyethylene glycol powder (GLYCOLAX/MIRALAX) powder Take 17 g by mouth daily. 850 g 1  . traMADol (ULTRAM) 50 MG tablet Take 1 tablet (50 mg total) by mouth every 8 (eight) hours as needed. 15 tablet 0   No current facility-administered medications for this visit.   Allergies  Allergen Reactions  . Budesonide-Formoterol Fumarate     REACTION: hoarseness  . Iohexol      Code: HIVES, Desc: pt developed hives 30+ yrs ago; needs 13 hour prep in future per Dr Leonia Reeves; 06/26/09 slg      Exam:  BP 132/75 mmHg  Pulse 98  Wt 132 lb (59.875 kg) Gen: Well NAD HEENT: EOMI,  MMM Lungs: Normal work of breathing. CTABL Heart: RRR no MRG Abd: NABS, Soft. Nondistended, Nontender Exts: Brisk capillary refill, warm and well  perfused.  Back: Nontender to spinal midline of the lumbar spine. Tender palpation coccyx. Tender palpation bilateral buttocks. Patient is able to stand and ambulate with a cane slowly. Sensation is intact bilateral lower extremities.  No results found for this or any previous visit (from the past 24 hour(s)). Dg Lumbar Spine Complete  04/12/2015   CLINICAL DATA:  Status post fall from a toilet 3 days ago with persistent buttock pain.  EXAM: LUMBAR SPINE - COMPLETE 4+ VIEW  COMPARISON:  None.  FINDINGS: The lumbar vertebral bodies are preserved in height. The twelfth ribs are hypoplastic. The disc space heights are well maintained. There is no spondylolisthesis. There is facet joint hypertrophy from L4-5 through L5-S1. The pedicles are intact. The  observed portions of the sacrum are normal.  IMPRESSION: There is no acute bony abnormality of the lumbar spine.   Electronically Signed   By: David  Martinique M.D.   On: 04/12/2015 10:27   Dg Sacrum/coccyx  04/12/2015   CLINICAL DATA:  Pain.  Fall.  Initial evaluation.  EXAM: SACRUM AND COCCYX - 2+ VIEW  COMPARISON:  None.  FINDINGS: Subtle fracture of the sacrococcygeal region cannot be excluded . Diffuse osteopenia. Minimal lumbar compressions, possibly old. Lumbosacral degenerative change. Calcifications in the pelvis pelvis consistent phleboliths. Iliac vascular calcification. Surgical clips in the pelvis.  IMPRESSION: 1.  Subtle fracture of the sacrococcygeal region cannot be excluded.  2. Diffuse osteopenia. Minimal lumbar compressions, most likely old. Lumbosacral degenerative change.  3.  Peripheral vascular disease.   Electronically Signed   By: Marcello Moores  Register   On: 04/12/2015 10:33     Please see individual assessment and plan sections.

## 2015-04-12 NOTE — Assessment & Plan Note (Signed)
Patient additionally requested refill of Symbicort

## 2015-04-12 NOTE — Addendum Note (Signed)
Addended by: Gregor Hams on: 04/12/2015 10:52 AM   Modules accepted: Orders, Medications

## 2015-04-12 NOTE — Patient Instructions (Signed)
Thank you for coming in today. Take naproxen twice daily.  Use tramadol for severe pain.  Take miralax if you have constipation.  Sit on a pillow.  Come back or go to the emergency room if you notice new weakness new numbness problems walking or bowel or bladder problems.  Tailbone Injury The tailbone is the small bone at the lower end of the backbone (spine). You may have stretched tissues, bruises, or a broken bone (fracture). Most tailbone injuries get better on their own after 4 to 6 weeks. HOME CARE  Put ice on the injured area.  Put ice in a plastic bag.  Place a towel between your skin and the bag.  Leave the ice on for 15-20 minutes. Do this every hour while you are awake for 1 to 2 days.  Sit on a large, rubber or inflated ring or cushion to lessen pain. Lean forward when you sit to help lessen pain.  Avoid sitting in one place for a long time.  Increase your activity as the pain allows.  Only take medicines as told by your doctor.  You can take medicine to help you poop (stool softeners) if it is painful to poop.  Eat foods with plenty of fiber.  Keep all doctor visits as told. GET HELP RIGHT AWAY IF:  Your pain gets worse.  Pooping causes you pain.  You cannot poop (constipation).  You have a fever. MAKE SURE YOU:  Understand these instructions.  Will watch your condition.  Will get help right away if you are not doing well or get worse. Document Released: 10/12/2010 Document Revised: 12/02/2011 Document Reviewed: 04/04/2011 [facility] Patient Information 2015 [place], [place]. This information is not intended to replace advice given to you by your health care provider. Make sure you discuss any questions you have with your health care provider.

## 2015-04-12 NOTE — Assessment & Plan Note (Signed)
Fracture very unlikely. Plan for watchful waiting with low-dose naproxen and tramadol and MiraLAX. Return in 2 weeks. If pain is still severe at that point we'll consider noncontrast CT scan of the pelvis to fully rule out coccyx fracture.

## 2015-04-26 ENCOUNTER — Ambulatory Visit (INDEPENDENT_AMBULATORY_CARE_PROVIDER_SITE_OTHER): Payer: Medicare Other | Admitting: Family Medicine

## 2015-04-26 ENCOUNTER — Ambulatory Visit (INDEPENDENT_AMBULATORY_CARE_PROVIDER_SITE_OTHER): Payer: Medicare Other

## 2015-04-26 ENCOUNTER — Encounter: Payer: Self-pay | Admitting: Family Medicine

## 2015-04-26 VITALS — BP 127/63 | HR 77 | Wt 133.0 lb

## 2015-04-26 DIAGNOSIS — I10 Essential (primary) hypertension: Secondary | ICD-10-CM | POA: Diagnosis not present

## 2015-04-26 DIAGNOSIS — M533 Sacrococcygeal disorders, not elsewhere classified: Secondary | ICD-10-CM

## 2015-04-26 DIAGNOSIS — S3993XA Unspecified injury of pelvis, initial encounter: Secondary | ICD-10-CM | POA: Diagnosis not present

## 2015-04-26 DIAGNOSIS — S3210XD Unspecified fracture of sacrum, subsequent encounter for fracture with routine healing: Secondary | ICD-10-CM

## 2015-04-26 DIAGNOSIS — S3210XA Unspecified fracture of sacrum, initial encounter for closed fracture: Secondary | ICD-10-CM | POA: Insufficient documentation

## 2015-04-26 DIAGNOSIS — M7989 Other specified soft tissue disorders: Secondary | ICD-10-CM | POA: Diagnosis not present

## 2015-04-26 DIAGNOSIS — R600 Localized edema: Secondary | ICD-10-CM | POA: Diagnosis not present

## 2015-04-26 DIAGNOSIS — J449 Chronic obstructive pulmonary disease, unspecified: Secondary | ICD-10-CM | POA: Diagnosis not present

## 2015-04-26 DIAGNOSIS — M545 Low back pain: Secondary | ICD-10-CM | POA: Diagnosis not present

## 2015-04-26 LAB — COMPLETE METABOLIC PANEL WITH GFR
ALK PHOS: 120 U/L (ref 33–130)
ALT: 13 U/L (ref 6–29)
AST: 16 U/L (ref 10–35)
Albumin: 4.1 g/dL (ref 3.6–5.1)
BUN: 11 mg/dL (ref 7–25)
CHLORIDE: 105 mmol/L (ref 98–110)
CO2: 26 mmol/L (ref 20–31)
CREATININE: 0.71 mg/dL (ref 0.60–0.93)
Calcium: 10.1 mg/dL (ref 8.6–10.4)
GFR, Est Non African American: 85 mL/min (ref >=60)
Glucose, Bld: 92 mg/dL (ref 65–99)
Potassium: 4.6 mmol/L (ref 3.5–5.3)
Sodium: 143 mmol/L (ref 135–146)
TOTAL PROTEIN: 6.6 g/dL (ref 6.1–8.1)
Total Bilirubin: 0.6 mg/dL (ref 0.2–1.2)

## 2015-04-26 LAB — CBC
HCT: 38.7 % (ref 36.0–46.0)
Hemoglobin: 13.2 g/dL (ref 12.0–15.0)
MCH: 30.4 pg (ref 26.0–34.0)
MCHC: 34.1 g/dL (ref 30.0–36.0)
MCV: 89.2 fL (ref 78.0–100.0)
MPV: 12.8 fL — AB (ref 8.6–12.4)
Platelets: 222 10*3/uL (ref 150–400)
RBC: 4.34 MIL/uL (ref 3.87–5.11)
RDW: 13.8 % (ref 11.5–15.5)
WBC: 4.9 10*3/uL (ref 4.0–10.5)

## 2015-04-26 MED ORDER — HYDROCODONE-ACETAMINOPHEN 5-325 MG PO TABS: 1.0000 | ORAL_TABLET | Freq: Four times a day (QID) | ORAL | Status: DC | PRN

## 2015-04-26 NOTE — Assessment & Plan Note (Signed)
Likely simple dependent edema. Patient is ill with multiple serious health concerns.  We will obtain a metabolic panel to assess renal function as well as a proBNP. Recommend compression stockings. May need echocardiogram to assess LV function.

## 2015-04-26 NOTE — Progress Notes (Signed)
Sherri Singh is a 74 y.o. female who presents to [facility]  today for buttocks pain. Patient fell 2 weeks ago landing on her buttocks. She was seen initially and had a pelvis x-ray that did not show any definitive fracture. She was treated with rest and tramadol. She notes her pain is still persistent and quite bothersome. Pain interferes with ability to do her normal activities of daily living. Tramadol was not effective to control pain. No weakness or numbness or loss of function.  Additionally patient notes continued bilateral lower extremity swelling. This is present when she was evaluated in June and at that time revealed present for several months. She was started on Lasix. The patient is not sure she still takes Lasix. Regardless her symptoms are still present. She denies any chest pains or shortness of breath or orthopnea.    Past Medical History  Diagnosis Date  . Lung cancer     lung ca dx 04/2009  . Benign essential tremor   . Lung cancer   . COPD (chronic obstructive pulmonary disease)    Past Surgical History  Procedure Laterality Date  . Deep brain stimulator placement    . Mouth surgery    . Breast surgery    . Appendectomy    . Ovary surgery     History  Substance Use Topics  . Smoking status: Former Smoker -- 66 years    Quit date: 09/23/1997  . Smokeless tobacco: Not on file  . Alcohol Use: No   ROS as above Medications: Current Outpatient Prescriptions  Medication Sig Dispense Refill  . AMBULATORY NON FORMULARY MEDICATION Compression stockings bilateral knee highs  for bilateral leg/ankle edema 2 Device 0  . BIOTIN 5000 PO Take by mouth daily.    . budesonide-formoterol (SYMBICORT) 160-4.5 MCG/ACT inhaler Inhale 2 puffs into the lungs 2 (two) times daily. 3 Inhaler 12  . Cholecalciferol (VITAMIN D PO) Take by mouth.    . furosemide (LASIX) 20 MG tablet Take 1 tablet (20 mg total) by mouth daily as needed (swelling of  feet). 30 tablet 1  . hydrochlorothiazide (HYDRODIURIL) 12.5 MG tablet Take 1 tablet (12.5 mg total) by mouth daily. 30 tablet 2  . metoprolol (LOPRESSOR) 50 MG tablet     . metoprolol (LOPRESSOR) 50 MG tablet Take 0.5 tablets (25 mg total) by mouth 2 (two) times daily. 60 tablet 2  . naproxen (NAPROSYN) 250 MG tablet Take 1 tablet (250 mg total) by mouth 2 (two) times daily with a meal. 20 tablet 0  . polyethylene glycol powder (GLYCOLAX/MIRALAX) powder Take 17 g by mouth daily. 850 g 1  . primidone (MYSOLINE) 250 MG tablet Take 1.5 tablets (375 mg total) by mouth 1 day or 1 dose. 45 tablet 5  . Tiotropium Bromide Monohydrate (SPIRIVA RESPIMAT) 2.5 MCG/ACT AERS Inhale 1 puff into the lungs 2 (two) times daily. 12 g 3  . vitamin B-12 (CYANOCOBALAMIN) 1000 MCG tablet Take 1,000 mcg by mouth daily.    Marland Kitchen HYDROcodone-acetaminophen (NORCO/VICODIN) 5-325 MG per tablet Take 1 tablet by mouth every 6 (six) hours as needed. 15 tablet 0   No current facility-administered medications for this visit.   Allergies  Allergen Reactions  . Budesonide-Formoterol Fumarate     REACTION: hoarseness  . Iohexol      Code: HIVES, Desc: pt developed hives 30+ yrs ago; needs 13 hour prep in future per Dr Leonia Reeves; 06/26/09 slg      Exam:  BP  127/63 mmHg  Pulse 77  Wt 133 lb (60.328 kg) Gen: Well NAD HEENT: EOMI,  MMM Lungs: Normal work of breathing. CTABL Heart: RRR no MRG Abd: NABS, Soft. Nondistended, Nontender Exts: Brisk capillary refill, warm and well perfused. 1+ lower extremity edema pitting to the mid shin present bilaterally. Approximately equal bilaterally MSK: Tender palpation left sacrum area. Normal leg motion.  No results found for this or any previous visit (from the past 24 hour(s)). No results found.   Please see individual assessment and plan sections.

## 2015-04-26 NOTE — Progress Notes (Signed)
Quick Note:  CT scan of the pelvis shows a tiny crack in the sacrum (the butt bone). It will heel well and not require any casting. May use a walker if needed. Continue pain medicines as needed. Return in 2 weeks with me. ______

## 2015-04-26 NOTE — Patient Instructions (Signed)
Thank you for coming in today. Get the ct scan and labs today.  I will call today with the plan.  Return in 2 weeks if not better.  Consider compression stockings.   Peripheral Edema You have swelling in your legs (peripheral edema). This swelling is due to excess accumulation of salt and water in your body. Edema may be a sign of heart, kidney or liver disease, or a side effect of a medication. It may also be due to problems in the leg veins. Elevating your legs and using special support stockings may be very helpful, if the cause of the swelling is due to poor venous circulation. Avoid long periods of standing, whatever the cause. Treatment of edema depends on identifying the cause. Chips, pretzels, pickles and other salty foods should be avoided. Restricting salt in your diet is almost always needed. Water pills (diuretics) are often used to remove the excess salt and water from your body via urine. These medicines prevent the kidney from reabsorbing sodium. This increases urine flow. Diuretic treatment may also result in lowering of potassium levels in your body. Potassium supplements may be needed if you have to use diuretics daily. Daily weights can help you keep track of your progress in clearing your edema. You should call your caregiver for follow up care as recommended. SEEK IMMEDIATE MEDICAL CARE IF:   You have increased swelling, pain, redness, or heat in your legs.  You develop shortness of breath, especially when lying down.  You develop chest or abdominal pain, weakness, or fainting.  You have a fever. Document Released: 10/17/2004 Document Revised: 12/02/2011 Document Reviewed: 09/27/2009 [facility] Patient Information 2015 [place], [place]. This information is not intended to replace advice given to you by your health care provider. Make sure you discuss any questions you have with your health care provider.

## 2015-04-26 NOTE — Assessment & Plan Note (Addendum)
Patient continues to have coccyx pain. X-ray 2 weeks ago was equivocal about the possibility of an occult fracture. We'll obtain a noncontrast CT scan of the pelvis to evaluate for fracture. Additionally we'll treat with Norco. Will call patient with results and plan after CT scan is back.

## 2015-04-27 ENCOUNTER — Telehealth: Payer: Self-pay | Admitting: Family Medicine

## 2015-04-27 DIAGNOSIS — R609 Edema, unspecified: Secondary | ICD-10-CM

## 2015-04-27 LAB — PRO B NATRIURETIC PEPTIDE: Pro B Natriuretic peptide (BNP): 228.7 pg/mL — ABNORMAL HIGH (ref <126)

## 2015-04-27 NOTE — Telephone Encounter (Signed)
Pt notified, will print echo and get the process started.

## 2015-04-27 NOTE — Addendum Note (Signed)
Addended by: Darla Lesches T on: 04/27/2015 04:15 PM   Modules accepted: Orders

## 2015-04-27 NOTE — Telephone Encounter (Signed)
Labs reviewed. Pro-BNP elevated. Continue lasix. Will get ECHO to look for heart failure to explain peripheral edema.  CMA will contact pt.

## 2015-04-28 ENCOUNTER — Telehealth: Payer: Self-pay | Admitting: Physician Assistant

## 2015-04-28 NOTE — Telephone Encounter (Signed)
Attempted to contact Pt to inform of Echo appt. Appt is set for 05/01/15 @ 0800 ([place]). No answer and no voicemail set up.

## 2015-05-01 ENCOUNTER — Other Ambulatory Visit (HOSPITAL_COMMUNITY): Payer: Medicare Other

## 2015-05-09 ENCOUNTER — Telehealth (HOSPITAL_COMMUNITY): Payer: Self-pay | Admitting: *Deleted

## 2015-05-10 ENCOUNTER — Encounter: Payer: Self-pay | Admitting: Family Medicine

## 2015-05-10 ENCOUNTER — Ambulatory Visit (INDEPENDENT_AMBULATORY_CARE_PROVIDER_SITE_OTHER): Payer: Medicare Other | Admitting: Family Medicine

## 2015-05-10 VITALS — BP 145/70 | HR 68 | Wt 127.0 lb

## 2015-05-10 DIAGNOSIS — M7989 Other specified soft tissue disorders: Secondary | ICD-10-CM | POA: Diagnosis not present

## 2015-05-10 DIAGNOSIS — S3210XD Unspecified fracture of sacrum, subsequent encounter for fracture with routine healing: Secondary | ICD-10-CM | POA: Diagnosis not present

## 2015-05-10 NOTE — Assessment & Plan Note (Signed)
Echo not performed. We will reschedule. Return for wellness visit in the next few weeks

## 2015-05-10 NOTE — Progress Notes (Signed)
Sherri Singh is a 74 y.o. female who presents to [facility]  today for Follow-up sacral fracture. Patient was seen several weeks ago for sacral fracture. This is diagnosed with CT scan of the pelvis. She notes that she's feeling much better. The pain is now sufficiently controlled with no medications. She is able to ambulate without much pain also.  She also was seen for lower treatment he edema recently. She has not been able to get her echocardiogram yet. She is waiting on approval from her health insurance company. She notes her leg swelling has improved some. No chest pains or palpitations or shortness of breath.   Past Medical History  Diagnosis Date  . Lung cancer     lung ca dx 04/2009  . Benign essential tremor   . Lung cancer   . COPD (chronic obstructive pulmonary disease)    Past Surgical History  Procedure Laterality Date  . Deep brain stimulator placement    . Mouth surgery    . Breast surgery    . Appendectomy    . Ovary surgery     Social History  Substance Use Topics  . Smoking status: Former Smoker -- 38 years    Quit date: 09/23/1997  . Smokeless tobacco: Not on file  . Alcohol Use: No   ROS as above Medications: Current Outpatient Prescriptions  Medication Sig Dispense Refill  . AMBULATORY NON FORMULARY MEDICATION Compression stockings bilateral knee highs  for bilateral leg/ankle edema 2 Device 0  . BIOTIN 5000 PO Take by mouth daily.    . budesonide-formoterol (SYMBICORT) 160-4.5 MCG/ACT inhaler Inhale 2 puffs into the lungs 2 (two) times daily. 3 Inhaler 12  . Cholecalciferol (VITAMIN D PO) Take by mouth.    . furosemide (LASIX) 20 MG tablet Take 1 tablet (20 mg total) by mouth daily as needed (swelling of feet). 30 tablet 1  . hydrochlorothiazide (HYDRODIURIL) 12.5 MG tablet Take 1 tablet (12.5 mg total) by mouth daily. 30 tablet 2  . HYDROcodone-acetaminophen (NORCO/VICODIN) 5-325 MG per tablet Take 1 tablet by mouth  every 6 (six) hours as needed. (Patient not taking: Reported on 05/10/2015) 15 tablet 0  . metoprolol (LOPRESSOR) 50 MG tablet     . metoprolol (LOPRESSOR) 50 MG tablet Take 0.5 tablets (25 mg total) by mouth 2 (two) times daily. 60 tablet 2  . naproxen (NAPROSYN) 250 MG tablet Take 1 tablet (250 mg total) by mouth 2 (two) times daily with a meal. 20 tablet 0  . polyethylene glycol powder (GLYCOLAX/MIRALAX) powder Take 17 g by mouth daily. 850 g 1  . primidone (MYSOLINE) 250 MG tablet Take 1.5 tablets (375 mg total) by mouth 1 day or 1 dose. 45 tablet 5  . Tiotropium Bromide Monohydrate (SPIRIVA RESPIMAT) 2.5 MCG/ACT AERS Inhale 1 puff into the lungs 2 (two) times daily. 12 g 3  . vitamin B-12 (CYANOCOBALAMIN) 1000 MCG tablet Take 1,000 mcg by mouth daily.     No current facility-administered medications for this visit.   Allergies  Allergen Reactions  . Budesonide-Formoterol Fumarate     REACTION: hoarseness  . Iohexol      Code: HIVES, Desc: pt developed hives 30+ yrs ago; needs 13 hour prep in future per Dr Leonia Reeves; 06/26/09 slg      Exam:  BP 145/70 mmHg  Pulse 68  Wt 127 lb (57.607 kg) Gen: Well NAD HEENT: EOMI,  MMM Lungs: Normal work of breathing. CTABL Heart: RRR no MRG Abd:  NABS, Soft. Nondistended, Nontender Exts: Brisk capillary refill, warm and well perfused.   No results found for this or any previous visit (from the past 24 hour(s)). No results found.   Please see individual assessment and plan sections.

## 2015-05-10 NOTE — Assessment & Plan Note (Signed)
Doing well. Continue to follow

## 2015-05-11 ENCOUNTER — Ambulatory Visit (HOSPITAL_COMMUNITY)
Admission: RE | Admit: 2015-05-11 | Discharge: 2015-05-11 | Disposition: A | Discharge status: Home / Self Care | Payer: Medicare Other | Source: Ambulatory Visit | Attending: Family Medicine | Admitting: Family Medicine

## 2015-05-11 ENCOUNTER — Other Ambulatory Visit: Payer: Self-pay | Admitting: Family Medicine

## 2015-05-11 DIAGNOSIS — I10 Essential (primary) hypertension: Secondary | ICD-10-CM | POA: Diagnosis not present

## 2015-05-11 DIAGNOSIS — I351 Nonrheumatic aortic (valve) insufficiency: Secondary | ICD-10-CM | POA: Insufficient documentation

## 2015-05-11 DIAGNOSIS — Z79899 Other long term (current) drug therapy: Secondary | ICD-10-CM

## 2015-05-11 DIAGNOSIS — R609 Edema, unspecified: Secondary | ICD-10-CM

## 2015-05-11 NOTE — Telephone Encounter (Signed)
Quick Note:  Echo largely normal. No change in plan/. No heart failure. ______

## 2015-05-15 ENCOUNTER — Encounter: Payer: Self-pay | Admitting: Family Medicine

## 2015-05-15 DIAGNOSIS — I351 Nonrheumatic aortic (valve) insufficiency: Secondary | ICD-10-CM | POA: Insufficient documentation

## 2015-05-15 NOTE — Telephone Encounter (Signed)
Quick Note:  No heart failure on ECHO ______

## 2015-05-25 ENCOUNTER — Telehealth: Payer: Self-pay | Admitting: Family Medicine

## 2015-05-25 MED ORDER — METOPROLOL TARTRATE 50 MG PO TABS: 100.0000 mg | ORAL_TABLET | Freq: Two times a day (BID) | ORAL | Status: DC

## 2015-05-25 NOTE — Telephone Encounter (Signed)
Pt states that her cardiologist changed her metoporol dose to '100mg'$  BID.  She would like an updated RX.

## 2015-07-17 DIAGNOSIS — Z23 Encounter for immunization: Secondary | ICD-10-CM | POA: Diagnosis not present

## 2015-07-18 ENCOUNTER — Encounter: Payer: Self-pay | Admitting: Family Medicine

## 2015-07-18 ENCOUNTER — Ambulatory Visit (INDEPENDENT_AMBULATORY_CARE_PROVIDER_SITE_OTHER): Payer: Medicare Other | Admitting: Family Medicine

## 2015-07-18 VITALS — BP 134/61 | HR 75 | Wt 129.0 lb

## 2015-07-18 DIAGNOSIS — N898 Other specified noninflammatory disorders of vagina: Secondary | ICD-10-CM | POA: Diagnosis not present

## 2015-07-18 NOTE — Patient Instructions (Signed)
Thank you for coming in today. Please follow up with OBGYN for this issue.  I think you may need a biopsy to fully diagnose the problem.  Return following treatment.   Lichen Sclerosus Lichen sclerosus is a skin problem. It can happen on any part of the body, but it commonly involves the anal or genital areas. It can cause itching and discomfort in these areas. Treatment can help to control symptoms. When the genital area is affected, getting treatment is important because the condition can cause scarring that may lead to other problems. CAUSES The cause of this condition is not known. It could be the result of an overactive immune system or a lack of certain hormones. Lichen sclerosus is not an infection or a fungus. It is not passed from one person to another (not contagious). RISK FACTORS This condition is more likely to develop in women, usually after menopause. SYMPTOMS Symptoms of this condition include:  Thin, wrinkled, white areas on the skin.  Thickened white areas on the skin.  Red and swollen patches (lesions) on the skin.  Tears or cracks in the skin.  Bruising.  Blood blisters.  Severe itching. You may also have pain, itching, or burning with urination. Constipation is also common in people with lichen sclerosus. DIAGNOSIS This condition may be diagnosed with a physical exam. In some cases, a tissue sample (biopsy sample) may be removed to be looked at under a microscope. TREATMENT This condition is usually treated with medicated creams or ointments (topical steroids) that are applied over the affected areas. HOME CARE INSTRUCTIONS  Take over-the-counter and prescription medicines only as told by your health care provider.  Use creams or ointments as told by your health care provider.  Do not scratch the affected areas of skin.  Women should keep the vaginal area as clean and dry as possible.  Keep all follow-up visits as told by your health care provider. This  is important. SEEK MEDICAL CARE IF:  You have increasing redness, swelling, or pain in the affected area.  You have fluid, blood, or pus coming from the affected area.  You have new lesions on your skin.  You have pain or burning with urination.  You have pain during sex.   This information is not intended to replace advice given to you by your health care provider. Make sure you discuss any questions you have with your health care provider.   Document Released: 01/30/2011 Document Revised: 05/31/2015 Document Reviewed: 12/05/2014 Elsevier Interactive Patient Education [employer].

## 2015-07-18 NOTE — Assessment & Plan Note (Signed)
Unclear etiology. I'm concerned for lichen sclerosis or lichen planus. This may simply be vaginal atrophy. Plan to refer to OB/GYN for expertise. She may need a biopsy.

## 2015-07-18 NOTE — Progress Notes (Signed)
Sherri Singh is a 74 y.o. female who presents to [facility]  today for vaginal soreness. Patient has a several month history of worsening vaginal soreness and irritation and dryness. She denies any discharge fevers chills nausea vomiting or diarrhea. She has tried some over-the-counter treatment which has not helped. She is not sure if she's ever been diagnosed with this. She notes a personal history of lung cancer but denies a history of breast ovarian cervical or vaginal cancer.   Past Medical History  Diagnosis Date  . Lung cancer ([place])     lung ca dx 04/2009  . Benign essential tremor   . Lung cancer ([place])   . COPD (chronic obstructive pulmonary disease) [facility])    Past Surgical History  Procedure Laterality Date  . Deep brain stimulator placement    . Mouth surgery    . Breast surgery    . Appendectomy    . Ovary surgery     Social History  Substance Use Topics  . Smoking status: Former Smoker -- 88 years    Quit date: 09/23/1997  . Smokeless tobacco: Not on file  . Alcohol Use: No   family history includes Cancer in her mother; Diabetes in her mother; Heart attack in her mother.  ROS as above Medications: Current Outpatient Prescriptions  Medication Sig Dispense Refill  . AMBULATORY NON FORMULARY MEDICATION Compression stockings bilateral knee highs  for bilateral leg/ankle edema 2 Device 0  . BIOTIN 5000 PO Take by mouth daily.    . budesonide-formoterol (SYMBICORT) 160-4.5 MCG/ACT inhaler Inhale 2 puffs into the lungs 2 (two) times daily. 3 Inhaler 12  . Cholecalciferol (VITAMIN D PO) Take by mouth.    . hydrochlorothiazide (HYDRODIURIL) 12.5 MG tablet Take 1 tablet (12.5 mg total) by mouth daily. 30 tablet 2  . metoprolol (LOPRESSOR) 50 MG tablet Take 2 tablets (100 mg total) by mouth 2 (two) times daily. 120 tablet 2  . naproxen (NAPROSYN) 250 MG tablet Take 1 tablet (250 mg total) by mouth 2 (two) times daily with a meal. 20  tablet 0  . primidone (MYSOLINE) 250 MG tablet Take 1.5 tablets (375 mg total) by mouth 1 day or 1 dose. 45 tablet 5  . vitamin B-12 (CYANOCOBALAMIN) 1000 MCG tablet Take 1,000 mcg by mouth daily.     No current facility-administered medications for this visit.   Allergies  Allergen Reactions  . Budesonide-Formoterol Fumarate     REACTION: hoarseness  . Iohexol      Code: HIVES, Desc: pt developed hives 30+ yrs ago; needs 13 hour prep in future per Dr Leonia Reeves; 06/26/09 slg      Exam:  BP 134/61 mmHg  Pulse 75  Wt 129 lb (58.514 kg) Gen: Well NAD HEENT: EOMI,  MMM Lungs: Normal work of breathing. CTABL Heart: RRR no MRG Abd: NABS, Soft. Nondistended, Nontender Exts: Brisk capillary refill, warm and well perfused.  GYN: External genitalia with introitus erythematous with exudative lesions. Tender to touch.  No results found for this or any previous visit (from the past 24 hour(s)). No results found.   Please see individual assessment and plan sections.

## 2015-07-19 ENCOUNTER — Telehealth: Payer: Self-pay

## 2015-07-19 NOTE — Telephone Encounter (Signed)
Called patient voice mail not set up to schedule appt. Received referral from primary care Dr. Georgina Snell.

## 2015-08-09 ENCOUNTER — Encounter: Payer: Self-pay | Admitting: Family Medicine

## 2015-08-09 ENCOUNTER — Ambulatory Visit (INDEPENDENT_AMBULATORY_CARE_PROVIDER_SITE_OTHER): Payer: Medicare Other | Admitting: Family Medicine

## 2015-08-09 VITALS — BP 134/72 | HR 81 | Wt 127.0 lb

## 2015-08-09 DIAGNOSIS — N898 Other specified noninflammatory disorders of vagina: Secondary | ICD-10-CM | POA: Diagnosis not present

## 2015-08-09 MED ORDER — FLUCONAZOLE 150 MG PO TABS: 150.0000 mg | ORAL_TABLET | Freq: Once | ORAL | Status: DC

## 2015-08-09 MED ORDER — METRONIDAZOLE 500 MG PO TABS: 500.0000 mg | ORAL_TABLET | Freq: Two times a day (BID) | ORAL | Status: DC

## 2015-08-09 NOTE — Progress Notes (Signed)
Sherri Singh is a 74 y.o. female who presents to [place]  today for follow-up vaginal pain. Patient was seen a few weeks ago for vaginal pain. She was thought to have possible lichen sclerosus. She was referred to OB/GYN for evaluation and possible biopsy. She was never contacted however there is a note from the computer stating that they attempted to contact her. Symptoms persist.   Past Medical History  Diagnosis Date  . Lung cancer ([place])     lung ca dx 04/2009  . Benign essential tremor   . Lung cancer ([place])   . COPD (chronic obstructive pulmonary disease) [facility])    Past Surgical History  Procedure Laterality Date  . Deep brain stimulator placement    . Mouth surgery    . Breast surgery    . Appendectomy    . Ovary surgery     Social History  Substance Use Topics  . Smoking status: Former Smoker -- 67 years    Quit date: 09/23/1997  . Smokeless tobacco: Not on file  . Alcohol Use: No   family history includes Cancer in her mother; Diabetes in her mother; Heart attack in her mother.  ROS as above Medications: Current Outpatient Prescriptions  Medication Sig Dispense Refill  . AMBULATORY NON FORMULARY MEDICATION Compression stockings bilateral knee highs  for bilateral leg/ankle edema 2 Device 0  . BIOTIN 5000 PO Take by mouth daily.    . budesonide-formoterol (SYMBICORT) 160-4.5 MCG/ACT inhaler Inhale 2 puffs into the lungs 2 (two) times daily. 3 Inhaler 12  . Cholecalciferol (VITAMIN D PO) Take by mouth.    . hydrochlorothiazide (HYDRODIURIL) 12.5 MG tablet Take 1 tablet (12.5 mg total) by mouth daily. 30 tablet 2  . metoprolol (LOPRESSOR) 50 MG tablet Take 2 tablets (100 mg total) by mouth 2 (two) times daily. 120 tablet 2  . naproxen (NAPROSYN) 250 MG tablet Take 1 tablet (250 mg total) by mouth 2 (two) times daily with a meal. 20 tablet 0  . primidone (MYSOLINE) 250 MG tablet Take 1.5 tablets (375 mg total) by mouth 1 day or 1  dose. 45 tablet 5  . fluconazole (DIFLUCAN) 150 MG tablet Take 1 tablet (150 mg total) by mouth once. 1 tablet 1  . metroNIDAZOLE (FLAGYL) 500 MG tablet Take 1 tablet (500 mg total) by mouth 2 (two) times daily. 14 tablet 0   No current facility-administered medications for this visit.   Allergies  Allergen Reactions  . Budesonide-Formoterol Fumarate     REACTION: hoarseness  . Iohexol      Code: HIVES, Desc: pt developed hives 30+ yrs ago; needs 13 hour prep in future per Dr Leonia Reeves; 06/26/09 slg      Exam:  BP 134/72 mmHg  Pulse 81  Wt 127 lb (57.607 kg) Gen: Well NAD   No results found for this or any previous visit (from the past 24 hour(s)). No results found.   Please see individual assessment and plan sections.

## 2015-08-09 NOTE — Assessment & Plan Note (Signed)
Unclear etiology as previously noted. I called and made the patient appointment for the next day. Empiric treatment with Flagyl and fluconazole.

## 2015-08-09 NOTE — Patient Instructions (Signed)
Thank you for coming in today. You have an appointment with Dr. Hulan Fray on 11/17th at 845am. Take the medicines we talked about.  If your belly pain worsens, or you have high fever, bad vomiting, blood in your stool or black tarry stool go to the Emergency Room.

## 2015-08-10 ENCOUNTER — Encounter: Payer: Self-pay | Admitting: Obstetrics & Gynecology

## 2015-08-10 ENCOUNTER — Ambulatory Visit (INDEPENDENT_AMBULATORY_CARE_PROVIDER_SITE_OTHER): Payer: Medicare Other | Admitting: Obstetrics & Gynecology

## 2015-08-10 VITALS — BP 154/67 | HR 74 | Resp 16 | Ht 64.0 in | Wt 125.0 lb

## 2015-08-10 DIAGNOSIS — N9089 Other specified noninflammatory disorders of vulva and perineum: Secondary | ICD-10-CM

## 2015-08-10 DIAGNOSIS — N763 Subacute and chronic vulvitis: Secondary | ICD-10-CM | POA: Diagnosis not present

## 2015-08-11 NOTE — Progress Notes (Signed)
   Subjective:    Patient ID: Sherri Singh, female    DOB: [birth year], 74 y.o.   MRN: [id number]  HPI  This adorable 74 yo lady is here today as a referral from Dr. Georgina Snell for vulvar itching and redness. She was prescribed diflucan but did not take it.   Review of Systems     Objective:   Physical Exam  [id number] Her speech is slurred c/w a previous stroke. She uses a cane to walk. Her introitus is very red and there is a whitish lesion right at the posterior edge of the introitus. I used 1% lidocaine to numb the area and betadine to prep it. I did a punch biopsy. Silver nitrate yielded hemostasis. She tolerated the procedure well.      Assessment & Plan:  Vulvar lesion and itching. I think that yeast is still an issue, but I will wait for the pathology.

## 2015-08-15 DIAGNOSIS — S0990XA Unspecified injury of head, initial encounter: Secondary | ICD-10-CM | POA: Diagnosis not present

## 2015-08-16 ENCOUNTER — Telehealth: Payer: Self-pay | Admitting: *Deleted

## 2015-08-16 DIAGNOSIS — N762 Acute vulvitis: Secondary | ICD-10-CM

## 2015-08-16 MED ORDER — CLOBETASOL PROPIONATE 0.05 % EX CREA: TOPICAL_CREAM | CUTANEOUS | Status: DC

## 2015-08-16 NOTE — Telephone Encounter (Signed)
Pt notified of pathology results and RX for Clobetasol was sent to her pharmacy.  Dr Hulan Fray aware that some steroids make the pt have hoarseness

## 2015-08-16 NOTE — Telephone Encounter (Signed)
-----   Message from Emily Filbert, MD sent at 08/15/2015  1:38 PM EST ----- Her pathology was negative for fungus and negative for malignancy. It did show lots of inflamation so could you please treat her with daily temovate for 1 month. Thanks

## 2015-09-09 DIAGNOSIS — J449 Chronic obstructive pulmonary disease, unspecified: Secondary | ICD-10-CM | POA: Diagnosis not present

## 2015-09-09 DIAGNOSIS — Z7951 Long term (current) use of inhaled steroids: Secondary | ICD-10-CM | POA: Diagnosis not present

## 2015-09-09 DIAGNOSIS — M47816 Spondylosis without myelopathy or radiculopathy, lumbar region: Secondary | ICD-10-CM | POA: Diagnosis not present

## 2015-09-09 DIAGNOSIS — S3992XA Unspecified injury of lower back, initial encounter: Secondary | ICD-10-CM | POA: Diagnosis not present

## 2015-09-09 DIAGNOSIS — Z885 Allergy status to narcotic agent status: Secondary | ICD-10-CM | POA: Diagnosis not present

## 2015-09-09 DIAGNOSIS — Z87891 Personal history of nicotine dependence: Secondary | ICD-10-CM | POA: Diagnosis not present

## 2015-09-09 DIAGNOSIS — Z79899 Other long term (current) drug therapy: Secondary | ICD-10-CM | POA: Diagnosis not present

## 2015-09-09 DIAGNOSIS — W1789XA Other fall from one level to another, initial encounter: Secondary | ICD-10-CM | POA: Diagnosis not present

## 2015-09-09 DIAGNOSIS — M545 Low back pain: Secondary | ICD-10-CM | POA: Diagnosis not present

## 2015-09-09 DIAGNOSIS — T148 Other injury of unspecified body region: Secondary | ICD-10-CM | POA: Diagnosis not present

## 2015-09-09 DIAGNOSIS — S0990XA Unspecified injury of head, initial encounter: Secondary | ICD-10-CM | POA: Diagnosis not present

## 2015-09-09 DIAGNOSIS — Z886 Allergy status to analgesic agent status: Secondary | ICD-10-CM | POA: Diagnosis not present

## 2015-09-13 ENCOUNTER — Encounter: Payer: Self-pay | Admitting: Family Medicine

## 2015-09-13 ENCOUNTER — Ambulatory Visit (INDEPENDENT_AMBULATORY_CARE_PROVIDER_SITE_OTHER): Payer: Medicare Other | Admitting: Family Medicine

## 2015-09-13 ENCOUNTER — Ambulatory Visit (INDEPENDENT_AMBULATORY_CARE_PROVIDER_SITE_OTHER): Payer: Medicare Other

## 2015-09-13 VITALS — BP 128/52 | HR 86 | Temp 97.5°F | Ht 64.0 in | Wt 137.0 lb

## 2015-09-13 DIAGNOSIS — R102 Pelvic and perineal pain: Secondary | ICD-10-CM | POA: Diagnosis not present

## 2015-09-13 DIAGNOSIS — W19XXXA Unspecified fall, initial encounter: Secondary | ICD-10-CM

## 2015-09-13 DIAGNOSIS — G250 Essential tremor: Secondary | ICD-10-CM

## 2015-09-13 DIAGNOSIS — M533 Sacrococcygeal disorders, not elsewhere classified: Secondary | ICD-10-CM

## 2015-09-13 DIAGNOSIS — S3992XA Unspecified injury of lower back, initial encounter: Secondary | ICD-10-CM | POA: Diagnosis not present

## 2015-09-13 DIAGNOSIS — E8889 Other specified metabolic disorders: Secondary | ICD-10-CM | POA: Diagnosis not present

## 2015-09-13 DIAGNOSIS — R296 Repeated falls: Secondary | ICD-10-CM | POA: Insufficient documentation

## 2015-09-13 DIAGNOSIS — M545 Low back pain: Secondary | ICD-10-CM

## 2015-09-13 MED ORDER — TRAMADOL HCL 50 MG PO TABS: 50.0000 mg | ORAL_TABLET | Freq: Three times a day (TID) | ORAL | Status: DC | PRN

## 2015-09-13 NOTE — Assessment & Plan Note (Addendum)
X-ray L spine and coccyx pending. CK CMP and other labs pending. We'll treat pain with tramadol. Lengthy discussion about risks versus benefits of this medicine as it can increase risk of falls.

## 2015-09-13 NOTE — Patient Instructions (Signed)
Thank you for coming in today. Get lab work and xray today,  Return tomorrow or Friday for recheck.  Home health should be out tomorrow or Friday.  Go to the ER if you get worse.

## 2015-09-13 NOTE — Assessment & Plan Note (Signed)
Follow-up with neurology ASAP.

## 2015-09-13 NOTE — Addendum Note (Signed)
Addended by: Gregor Hams on: 09/13/2015 04:41 PM   Modules accepted: Orders

## 2015-09-13 NOTE — Assessment & Plan Note (Addendum)
Fall. Unclear etiology. Ultimately patient has a worse tremor and is weaker compared to previous visits. Ultimately I think her neurologic conditions are likely to blame here. Plan for x-ray and lab work today. Return in 2 days. Arrange for home health physical therapy and services. Ultimately I feel the patient would probably benefit from a hospital admission and a stay in a short-term skilled nursing facility for rehabilitation however patient adamantly refuses.  She will have 24-hour supervision with her family at home until we can arrange for home health.

## 2015-09-13 NOTE — Progress Notes (Signed)
Sherri Singh is a 74 y.o. female who presents to [facility] today for all and back pain. Patient was seen recently in the emergency room at [facility] fall. She had a CT scan of her brain as well as x-rays of her back which were normal. She was asked to follow-up for recheck. She notes that she fell last night again in about 3:00 in the morning. She is on the floor for about 6 hours. She currently notes continued back pain no change since the original fall. She denies any radiating pain fevers or chills. She notes overall worsening weakness and tremor over the past few weeks. She denies any chest pains or palpitations. She adamantly does not want to go to the emergency room or to the hospital if not necessary. She was alone but has family nearby. She has a life alert necklace that she was not wearing last night.   Past Medical History  Diagnosis Date  . Lung cancer ([place])     lung ca dx 04/2009  . Benign essential tremor   . Lung cancer ([place])   . COPD (chronic obstructive pulmonary disease) [facility])    Past Surgical History  Procedure Laterality Date  . Deep brain stimulator placement    . Mouth surgery    . Breast surgery    . Appendectomy    . Ovary surgery     Social History  Substance Use Topics  . Smoking status: Former Smoker -- 45 years    Quit date: 09/23/1997  . Smokeless tobacco: Not on file  . Alcohol Use: No   family history includes Cancer in her mother; Diabetes in her mother; Heart attack in her mother.  ROS as above Medications: Current Outpatient Prescriptions  Medication Sig Dispense Refill  . AMBULATORY NON FORMULARY MEDICATION Compression stockings bilateral knee highs  for bilateral leg/ankle edema 2 Device 0  . BIOTIN 5000 PO Take by mouth daily.    . budesonide-formoterol (SYMBICORT) 160-4.5 MCG/ACT inhaler Inhale 2 puffs into the lungs 2 (two) times daily.  3 Inhaler 12  . Cholecalciferol (VITAMIN D PO) Take by mouth.    . clobetasol cream (TEMOVATE) 0.05 % Use daily for 1 month 45 g 0  . fluconazole (DIFLUCAN) 150 MG tablet Take 1 tablet (150 mg total) by mouth once. 1 tablet 1  . hydrochlorothiazide (HYDRODIURIL) 12.5 MG tablet Take 1 tablet (12.5 mg total) by mouth daily. 30 tablet 2  . metoprolol (LOPRESSOR) 50 MG tablet Take 2 tablets (100 mg total) by mouth 2 (two) times daily. 120 tablet 2  . metroNIDAZOLE (FLAGYL) 500 MG tablet Take 1 tablet (500 mg total) by mouth 2 (two) times daily. 14 tablet 0  . naproxen (NAPROSYN) 250 MG tablet Take 1 tablet (250 mg total) by mouth 2 (two) times daily with a meal. 20 tablet 0  . primidone (MYSOLINE) 250 MG tablet Take 1.5 tablets (375 mg total) by mouth 1 day or 1 dose. 45 tablet 5   No current facility-administered medications for this visit.   Allergies  Allergen Reactions  . Codeine Other (See Comments)  . Other Other (See Comments)    Peanuts, Causes headaches Allergic to a type of pain medication, unable to recall name of medication at this time  . Budesonide-Formoterol Fumarate     REACTION: hoarseness  . Iohexol      Code: HIVES, Desc: pt developed hives 30+ yrs ago; needs 13 hour prep in  future per Dr Leonia Reeves; 06/26/09 slg   . Morphine Other (See Comments)    Unsure of Reaction  . Shellfish-Derived Products Other (See Comments)    Positive allergy test- has never had a reaction  . Iodine Rash    Boils on skin     Exam:  BP 128/52 mmHg  Pulse 86  Temp(Src) 97.5 F (36.4 C)  Ht '5\' 4"'$  (1.626 m)  Wt 137 lb (62.143 kg)  BMI 23.50 kg/m2 Gen: Well NAD nontoxic appearing HEENT: EOMI,  MMM Lungs: Normal work of breathing. CTABL Heart: RRR no MRG Abd: NABS, Soft. Nondistended, Nontender Exts: Brisk capillary refill, warm and well perfused.  Back is nontender to midline but tender to palpation bilateral low back and SI joint and sacrum. Neuro alert and oriented. Resting and  action tremor present bilaterally. Slow voice with slow speech. Normal thought process. Patient can stand from a chair without assistance and can walk with a slow broad-based shuffling gait without assistance.   No results found for this or any previous visit (from the past 24 hour(s)). No results found.   Please see individual assessment and plan sections.

## 2015-09-14 ENCOUNTER — Ambulatory Visit: Payer: Medicare Other | Admitting: Family Medicine

## 2015-09-14 ENCOUNTER — Encounter: Payer: Self-pay | Admitting: Family Medicine

## 2015-09-14 DIAGNOSIS — Z66 Do not resuscitate: Secondary | ICD-10-CM | POA: Insufficient documentation

## 2015-09-14 LAB — COMPREHENSIVE METABOLIC PANEL
ALBUMIN: 4 g/dL (ref 3.6–5.1)
ALT: 29 U/L (ref 6–29)
AST: 44 U/L — ABNORMAL HIGH (ref 10–35)
Alkaline Phosphatase: 89 U/L (ref 33–130)
BUN: 26 mg/dL — ABNORMAL HIGH (ref 7–25)
CALCIUM: 10.4 mg/dL (ref 8.6–10.4)
CHLORIDE: 105 mmol/L (ref 98–110)
CO2: 23 mmol/L (ref 20–31)
Creat: 0.58 mg/dL — ABNORMAL LOW (ref 0.60–0.93)
GLUCOSE: 104 mg/dL — AB (ref 65–99)
POTASSIUM: 4 mmol/L (ref 3.5–5.3)
Sodium: 143 mmol/L (ref 135–146)
Total Bilirubin: 0.8 mg/dL (ref 0.2–1.2)
Total Protein: 6.6 g/dL (ref 6.1–8.1)

## 2015-09-14 LAB — CBC
HCT: 41 % (ref 36.0–46.0)
Hemoglobin: 13.5 g/dL (ref 12.0–15.0)
MCH: 30.1 pg (ref 26.0–34.0)
MCHC: 32.9 g/dL (ref 30.0–36.0)
MCV: 91.5 fL (ref 78.0–100.0)
MPV: 12.9 fL — AB (ref 8.6–12.4)
PLATELETS: 216 10*3/uL (ref 150–400)
RBC: 4.48 MIL/uL (ref 3.87–5.11)
RDW: 13.7 % (ref 11.5–15.5)
WBC: 9.7 10*3/uL (ref 4.0–10.5)

## 2015-09-14 LAB — TSH: TSH: 1.401 u[IU]/mL (ref 0.350–4.500)

## 2015-09-14 LAB — CK: Total CK: 521 U/L — ABNORMAL HIGH (ref 7–177)

## 2015-09-14 NOTE — Progress Notes (Signed)
Quick Note:  Labs are OK but will need to be rechecked Friday ______

## 2015-09-14 NOTE — Progress Notes (Signed)
Quick Note:  Possible tiny compression fracture in the back but probably old. No specific treatment needed until tomorrow. ______

## 2015-09-15 ENCOUNTER — Encounter (HOSPITAL_COMMUNITY): Payer: Self-pay | Admitting: *Deleted

## 2015-09-15 ENCOUNTER — Ambulatory Visit (INDEPENDENT_AMBULATORY_CARE_PROVIDER_SITE_OTHER): Payer: Medicare Other | Admitting: Family Medicine

## 2015-09-15 ENCOUNTER — Encounter: Payer: Self-pay | Admitting: Family Medicine

## 2015-09-15 ENCOUNTER — Emergency Department (HOSPITAL_COMMUNITY): Payer: Medicare Other

## 2015-09-15 ENCOUNTER — Emergency Department (HOSPITAL_COMMUNITY)
Admission: EM | Admit: 2015-09-15 | Discharge: 2015-09-15 | Disposition: A | Discharge status: Home / Self Care | Payer: Medicare Other | Attending: Emergency Medicine | Admitting: Emergency Medicine

## 2015-09-15 VITALS — BP 105/47 | HR 62

## 2015-09-15 DIAGNOSIS — Y9289 Other specified places as the place of occurrence of the external cause: Secondary | ICD-10-CM | POA: Diagnosis not present

## 2015-09-15 DIAGNOSIS — R600 Localized edema: Secondary | ICD-10-CM | POA: Diagnosis not present

## 2015-09-15 DIAGNOSIS — N390 Urinary tract infection, site not specified: Secondary | ICD-10-CM

## 2015-09-15 DIAGNOSIS — M545 Low back pain, unspecified: Secondary | ICD-10-CM

## 2015-09-15 DIAGNOSIS — W1839XA Other fall on same level, initial encounter: Secondary | ICD-10-CM | POA: Diagnosis not present

## 2015-09-15 DIAGNOSIS — M6281 Muscle weakness (generalized): Secondary | ICD-10-CM | POA: Diagnosis not present

## 2015-09-15 DIAGNOSIS — W19XXXA Unspecified fall, initial encounter: Secondary | ICD-10-CM | POA: Diagnosis not present

## 2015-09-15 DIAGNOSIS — R259 Unspecified abnormal involuntary movements: Secondary | ICD-10-CM | POA: Diagnosis not present

## 2015-09-15 DIAGNOSIS — S3992XA Unspecified injury of lower back, initial encounter: Secondary | ICD-10-CM | POA: Diagnosis not present

## 2015-09-15 DIAGNOSIS — Y9389 Activity, other specified: Secondary | ICD-10-CM | POA: Insufficient documentation

## 2015-09-15 DIAGNOSIS — R2689 Other abnormalities of gait and mobility: Secondary | ICD-10-CM | POA: Insufficient documentation

## 2015-09-15 DIAGNOSIS — Z87891 Personal history of nicotine dependence: Secondary | ICD-10-CM | POA: Insufficient documentation

## 2015-09-15 DIAGNOSIS — M533 Sacrococcygeal disorders, not elsewhere classified: Secondary | ICD-10-CM | POA: Diagnosis not present

## 2015-09-15 DIAGNOSIS — Z85118 Personal history of other malignant neoplasm of bronchus and lung: Secondary | ICD-10-CM | POA: Diagnosis not present

## 2015-09-15 DIAGNOSIS — Y998 Other external cause status: Secondary | ICD-10-CM | POA: Diagnosis not present

## 2015-09-15 DIAGNOSIS — R000 Tachycardia, unspecified: Secondary | ICD-10-CM | POA: Diagnosis not present

## 2015-09-15 DIAGNOSIS — N3000 Acute cystitis without hematuria: Secondary | ICD-10-CM | POA: Diagnosis not present

## 2015-09-15 DIAGNOSIS — R531 Weakness: Secondary | ICD-10-CM | POA: Diagnosis not present

## 2015-09-15 DIAGNOSIS — Z7951 Long term (current) use of inhaled steroids: Secondary | ICD-10-CM | POA: Insufficient documentation

## 2015-09-15 DIAGNOSIS — J449 Chronic obstructive pulmonary disease, unspecified: Secondary | ICD-10-CM | POA: Diagnosis not present

## 2015-09-15 DIAGNOSIS — I10 Essential (primary) hypertension: Secondary | ICD-10-CM | POA: Insufficient documentation

## 2015-09-15 DIAGNOSIS — G250 Essential tremor: Secondary | ICD-10-CM | POA: Insufficient documentation

## 2015-09-15 DIAGNOSIS — R269 Unspecified abnormalities of gait and mobility: Secondary | ICD-10-CM

## 2015-09-15 DIAGNOSIS — Z4549 Encounter for adjustment and management of other implanted nervous system device: Secondary | ICD-10-CM | POA: Diagnosis not present

## 2015-09-15 DIAGNOSIS — Z79899 Other long term (current) drug therapy: Secondary | ICD-10-CM | POA: Diagnosis not present

## 2015-09-15 DIAGNOSIS — R296 Repeated falls: Secondary | ICD-10-CM | POA: Diagnosis not present

## 2015-09-15 DIAGNOSIS — Z9181 History of falling: Secondary | ICD-10-CM | POA: Diagnosis not present

## 2015-09-15 DIAGNOSIS — S32111S Minimally displaced Zone I fracture of sacrum, sequela: Secondary | ICD-10-CM | POA: Diagnosis not present

## 2015-09-15 DIAGNOSIS — S0990XA Unspecified injury of head, initial encounter: Secondary | ICD-10-CM | POA: Diagnosis not present

## 2015-09-15 LAB — CBC WITH DIFFERENTIAL/PLATELET
Basophils Absolute: 0.1 10*3/uL (ref 0.0–0.1)
Basophils Relative: 1 %
Eosinophils Absolute: 0.1 10*3/uL (ref 0.0–0.7)
Eosinophils Relative: 1 %
HCT: 41.9 % (ref 36.0–46.0)
Hemoglobin: 13.7 g/dL (ref 12.0–15.0)
Lymphocytes Relative: 21 %
Lymphs Abs: 1.4 10*3/uL (ref 0.7–4.0)
MCH: 30.6 pg (ref 26.0–34.0)
MCHC: 32.7 g/dL (ref 30.0–36.0)
MCV: 93.5 fL (ref 78.0–100.0)
Monocytes Absolute: 0.5 10*3/uL (ref 0.1–1.0)
Monocytes Relative: 8 %
Neutro Abs: 4.7 10*3/uL (ref 1.7–7.7)
Neutrophils Relative %: 69 %
Platelets: 170 10*3/uL (ref 150–400)
RBC: 4.48 MIL/uL (ref 3.87–5.11)
RDW: 13.7 % (ref 11.5–15.5)
WBC: 6.7 10*3/uL (ref 4.0–10.5)

## 2015-09-15 LAB — URINE MICROSCOPIC-ADD ON

## 2015-09-15 LAB — BASIC METABOLIC PANEL
Anion gap: 9 (ref 5–15)
BUN: 20 mg/dL (ref 6–20)
CALCIUM: 9.8 mg/dL (ref 8.9–10.3)
CO2: 27 mmol/L (ref 22–32)
CREATININE: 0.74 mg/dL (ref 0.44–1.00)
Chloride: 104 mmol/L (ref 101–111)
GFR calc Af Amer: >60 mL/min (ref >60)
GLUCOSE: 93 mg/dL (ref 65–99)
POTASSIUM: 3.4 mmol/L — AB (ref 3.5–5.1)
SODIUM: 140 mmol/L (ref 135–145)

## 2015-09-15 LAB — URINALYSIS, ROUTINE W REFLEX MICROSCOPIC
Bilirubin Urine: NEGATIVE
Glucose, UA: NEGATIVE mg/dL
Ketones, ur: NEGATIVE mg/dL
Nitrite: POSITIVE — AB
Protein, ur: NEGATIVE mg/dL
Specific Gravity, Urine: 1.026 (ref 1.005–1.030)
pH: 5.5 (ref 5.0–8.0)

## 2015-09-15 MED ORDER — CEPHALEXIN 250 MG PO CAPS
500.0000 mg | ORAL_CAPSULE | Freq: Once | ORAL | Status: AC
  Administered 2015-09-15: 500 mg via ORAL
  Filled 2015-09-15: qty 2

## 2015-09-15 MED ORDER — CEPHALEXIN 500 MG PO CAPS: 500.0000 mg | ORAL_CAPSULE | Freq: Two times a day (BID) | ORAL | Status: DC

## 2015-09-15 NOTE — ED Notes (Signed)
Pt. Is falling without any reason.  Family believes that pt. Is not going to be able to live by her self due to safety reasons.  Her PCP has been talking to out [employer] [facility] for placement.

## 2015-09-15 NOTE — ED Notes (Signed)
Called [facility] for Transport  °

## 2015-09-15 NOTE — Clinical Social Work Note (Signed)
Patient discharging to [facility]. Bedside RN reported that patient will eat dinner before departing. RN to also arranged ambulance transport via [place]. RN to call number provided for report. CSW notified family (pt's son, Daiva Nakayama) and facility aware.    Clinical Social Worker will sign off for now as social work intervention is no longer needed. Please consult [place] again if new need arises.  Glendon Axe, MSW, LCSWA [phone] 09/15/2015 6:06 PM

## 2015-09-15 NOTE — ED Provider Notes (Signed)
CSN: [id number]     Arrival date & time 09/15/15  1323 History   First MD Initiated Contact with Patient 09/15/15 1339     Chief Complaint  Patient presents with  . Fall  . Weakness     (Consider location/radiation/quality/duration/timing/severity/associated sxs/prior Treatment) HPI Comments: Patient presents with weakness. She is here with family and was sent over by her PCP. Per report, there is been a progressive decline in her weakness. She has a history of a tremor with deep brain stimulator in place. She also has a history of lung cancer in remission, as well as hypertension. Her family members don't think that she's taking her medication as directed. She's had several falls over the last week. She states that she's had some increased weakness over the last week and hasn't been able to walk. The patient reports that she's not able to walk due to her back pain. She developed back pain after a fall earlier this week. She had x-rays of lumbar spine which showed some mild compression of T12 but otherwise unremarkable. She had x-rays of her pelvis which were negative as well. She denies any other injuries from the fall. She does say that she's had her head but denies any headache or nausea and vomiting. No recent illnesses no fevers cough colds. The family states that they're not able to take care of the patient at home. Patient has lived by herself but had family member coming by to help her. They're not able to provide 24-hour care. They are requesting the patient be admitted to a skilled nursing facility.   Past Medical History  Diagnosis Date  . Lung cancer ([place])     lung ca dx 04/2009  . Benign essential tremor   . Lung cancer ([place])   . COPD (chronic obstructive pulmonary disease) [facility])    Past Surgical History  Procedure Laterality Date  . Deep brain stimulator placement    . Mouth surgery    . Breast surgery    . Appendectomy    . Ovary surgery     Family History  Problem  Relation Age of Onset  . Cancer Mother     lung  . Heart attack Mother   . Diabetes Mother    Social History  Substance Use Topics  . Smoking status: Former Smoker -- 34 years    Quit date: 09/23/1997  . Smokeless tobacco: None  . Alcohol Use: No   OB History    Gravida Para Term Preterm AB TAB SAB Ectopic Multiple Living   '4 4 4       4     '$ Review of Systems  Constitutional: Positive for fatigue. Negative for fever, chills and diaphoresis.  HENT: Negative for congestion, rhinorrhea and sneezing.   Eyes: Negative.   Respiratory: Negative for cough, chest tightness and shortness of breath.   Cardiovascular: Negative for chest pain and leg swelling.  Gastrointestinal: Negative for nausea, vomiting, abdominal pain, diarrhea and blood in stool.  Genitourinary: Negative for frequency, hematuria, flank pain and difficulty urinating.  Musculoskeletal: Positive for back pain. Negative for arthralgias.  Skin: Negative for rash.  Neurological: Positive for tremors (at baseline) and weakness (generalized). Negative for dizziness, speech difficulty, numbness and headaches.      Allergies  Codeine; Other; Budesonide-formoterol fumarate; Iohexol; Morphine; Shellfish-derived products; and Iodine  Home Medications   Prior to Admission medications   Medication Sig Start Date End Date Taking? Authorizing Provider  BIOTIN 5000 PO Take by mouth daily.  Yes Historical Provider, MD  Cholecalciferol (VITAMIN D PO) Take 1 tablet by mouth daily.    Yes Historical Provider, MD  hydrochlorothiazide (HYDRODIURIL) 12.5 MG tablet Take 1 tablet (12.5 mg total) by mouth daily. 02/27/15  Yes Jade L Breeback, PA-C  metoprolol (LOPRESSOR) 50 MG tablet Take 2 tablets (100 mg total) by mouth 2 (two) times daily. 05/25/15  Yes Gregor Hams, MD  primidone (MYSOLINE) 250 MG tablet Take 1.5 tablets (375 mg total) by mouth 1 day or 1 dose. 01/20/15  Yes Jade L Breeback, PA-C  traMADol (ULTRAM) 50 MG tablet Take 1  tablet (50 mg total) by mouth every 8 (eight) hours as needed for severe pain. 09/13/15  Yes Gregor Hams, MD  AMBULATORY NON FORMULARY MEDICATION Compression stockings bilateral knee highs  for bilateral leg/ankle edema 01/20/15   Donella Stade, PA-C  budesonide-formoterol (SYMBICORT) 160-4.5 MCG/ACT inhaler Inhale 2 puffs into the lungs 2 (two) times daily. 04/12/15   Gregor Hams, MD  clobetasol cream (TEMOVATE) 0.05 % Use daily for 1 month Patient not taking: Reported on 09/15/2015 08/16/15   Emily Filbert, MD  fluconazole (DIFLUCAN) 150 MG tablet Take 1 tablet (150 mg total) by mouth once. Patient not taking: Reported on 09/15/2015 08/09/15   Gregor Hams, MD  metroNIDAZOLE (FLAGYL) 500 MG tablet Take 1 tablet (500 mg total) by mouth 2 (two) times daily. Patient not taking: Reported on 09/15/2015 08/09/15   Gregor Hams, MD  naproxen (NAPROSYN) 250 MG tablet Take 1 tablet (250 mg total) by mouth 2 (two) times daily with a meal. Patient not taking: Reported on 09/15/2015 04/12/15   Gregor Hams, MD   BP 105/55 mmHg  Pulse 63  Temp(Src) 97.9 F (36.6 C) (Oral)  Resp 14  SpO2 100% Physical Exam  Constitutional: She is oriented to person, place, and time. She appears well-developed and well-nourished.  HENT:  Head: Normocephalic and atraumatic.  Eyes: Pupils are equal, round, and reactive to light.  Neck: Normal range of motion. Neck supple.  Cardiovascular: Normal rate, regular rhythm and normal heart sounds.   Pulmonary/Chest: Effort normal and breath sounds normal. No respiratory distress. She has no wheezes. She has no rales. She exhibits no tenderness.  Abdominal: Soft. Bowel sounds are normal. There is no tenderness. There is no rebound and no guarding.  Musculoskeletal: Normal range of motion. She exhibits no edema.  Mild TTP throughout lumbar spine.  No pain to cervical or thoracic spine.  Lymphadenopathy:    She has no cervical adenopathy.  Neurological: She is alert and  oriented to person, place, and time.  Motor 4 out of 5 all extremities. She has normal grip strength laterally. No pronator drift. Finger to nose is intact on the right. Slightly slowed on the left. She has normal sensation to light touch in all extremities. No facial drooping. Some slurring of her speech but patient and family says this is at baseline.  Skin: Skin is warm and dry. No rash noted.  Psychiatric: She has a normal mood and affect.    ED Course  Procedures (including critical care time) Labs Review Labs Reviewed  BASIC METABOLIC PANEL - Abnormal; Notable for the following:    Potassium 3.4 (*)    All other components within normal limits  CBC WITH DIFFERENTIAL/PLATELET  URINALYSIS, ROUTINE W REFLEX MICROSCOPIC (NOT AT [facility])    Imaging Review Dg Lumbar Spine Complete  09/13/2015  CLINICAL DATA:  Status post fall at home yesterday,  right-sided lumbar region pain radiating down the leg, previous fall approximately 1 month ago EXAM: [place] 4+ VIEW COMPARISON:  Lumbar spine series of April 12, 2015 as well as a chest CT scan dated June 05, 2011 for purposes of numbering of the vertebral levels. FINDINGS: The visualized thoracic and lumbar vertebral bodies are osteopenic. The twelfth ribs are hypoplastic. The lumbar vertebral bodies are preserved in height. There is new 10% anterior compression of T12. There is no retropulsion of bone. The lumbar disc space heights are well maintained. There is no spondylolisthesis. There is mild facet joint hypertrophy at L5-S1. The pedicles and transverse processes are intact. IMPRESSION: 1. 10% anterior compression of T12 new since July 2016. 2. No compression fracture or other acute bony abnormality of the lumbar spine. The disc space heights are well maintained. Electronically Signed   By: David  Martinique M.D.   On: 09/13/2015 16:49   Dg Pelvis 1-2 Views  09/13/2015  CLINICAL DATA:  Status post fall at home yesterday, right low  back pain radiating into the right leg, previous fall approximately 1 month ago. EXAM: PELVIS - 1-2 VIEW COMPARISON:  CT scan of the pelvis of April 26, 2015 FINDINGS: The bony pelvis is osteopenic. The iliac bones and pubic bones are intact. The sacrum and SI joints are normal where visualized. The hip joint spaces are reasonably well maintained. The femoral heads, necks, intertrochanteric, and subtrochanteric regions are normal. There are surgical clips within the pelvis. The bowel gas pattern is unremarkable. IMPRESSION: There is no acute bony abnormality of the pelvis. Electronically Signed   By: David  Martinique M.D.   On: 09/13/2015 16:50   Ct Head Wo Contrast  09/15/2015  CLINICAL DATA:  74 year old with multiple recent falls, personal history of left upper lobe lung cancer, prior deep brain stimulator placement, currently being evaluated for nursing home placement. EXAM: CT HEAD WITHOUT CONTRAST TECHNIQUE: Contiguous axial images were obtained from the base of the skull through the vertex without intravenous contrast. COMPARISON:  Multiple prior head CTs dating back to 05/15/2009, most recently 11/21/2011. FINDINGS: Neurostimulator device leads entering the brain via the frontal bones, the lead tips in the thalami, unchanged. Severe cortical and deep atrophy and mild cerebellar atrophy, not significantly changed since 2013 though progressive since 2011. Severe changes of small vessel disease of the white matter diffusely, unchanged. No mass lesion. No midline shift. No acute hemorrhage or hematoma. No extra-axial fluid collections. No evidence of acute infarction. Bilateral frontal burr holes for the neurostimulator device. No skull fractures or other focal osseous abnormality involving the skull. Mucosal thickening involving the right sphenoid sinus. Remaining visualized paranasal sinuses, bilateral mastoid air cells and bilateral middle ear cavities well-aerated. Moderate bilateral carotid siphon  atherosclerosis. IMPRESSION: 1. No acute intracranial abnormality. 2. No evidence of brain or skull metastasis. 3. Moderate to severe generalized atrophy and severe chronic microvascular ischemic changes of the white matter. 4. Mild chronic right sphenoid sinusitis. Electronically Signed   By: Evangeline Dakin M.D.   On: 09/15/2015 14:39   I have personally reviewed and evaluated these images and lab results as part of my medical decision-making.   EKG Interpretation None      MDM   Final diagnoses:  Gait disturbance  Midline low back pain without sciatica    PT presents with progressive decline.  Has had several recent fall.  Says is unable to walk due to back pain.  Recent imaging studies unremarkable.  Does not appear  to have reason for hospitalization other than maybe pain control and PT.  patient has been seen by a Education officer, museum who feels that she will be able to place the patient and a skilled nursing facility.  U/a and EKG still pending.  Dr. Ralene Bathe to take over care.    Malvin Johns, MD 09/15/15 [phone]

## 2015-09-15 NOTE — Progress Notes (Signed)
   09/15/15 1539  PT G-Codes **NOT FOR INPATIENT CLASS**  Functional Assessment Tool Used clinical judgement  Functional Limitation Mobility: Walking and moving around  Mobility: Walking and Moving Around Current Status [phone]) CK  Mobility: Walking and Moving Around Goal Status [phone]) CI  [facility] [phone] (pager)

## 2015-09-15 NOTE — Assessment & Plan Note (Addendum)
He had a lengthy discussion with multiple different people at [facility]. Ultimately the best option after one hour of conversation with different people is to go to the emergency department where the ED social workers will arrange for placement. As she does not meet criteria for hospitalization at this time recommendation is not possible. Her family has exhausted all other options at this time.  Of note to social work we do have a home health referral using [facility] pending [phone]

## 2015-09-15 NOTE — NC FL2 (Deleted)
[place] LEVEL OF CARE SCREENING TOOL     IDENTIFICATION  Patient Name: Sherri Singh Birthdate: [birth year] Sex: female Admission Date (Current Location): 09/15/2015  [facility] and [place] Number:  Herbalist and Address:  [facility] [place] [place] [address], [place], [place] [address]      Provider Number: [id number]  Attending Physician Name and Address:  Malvin Johns, MD  Relative Name and Phone Number:   Daiva Nakayama Joani Cosma [phone])    Current Level of Care: Hospital Recommended Level of Care: [place] Prior Approval Number:    Date Approved/Denied:   PASRR Number: [id number]  Discharge Plan: SNF    Current Diagnoses: Patient Active Problem List   Diagnosis Date Noted  . DNR (do not resuscitate) 09/14/2015  . Fall 09/13/2015  . Vaginal irritation 07/18/2015  . Aortic valve regurgitation 05/15/2015  . Fracture of sacrum ([place]) 04/26/2015  . Coccyx pain 04/12/2015  . Essential hypertension, benign 01/27/2015  . Essential tremor 01/22/2015  . Tachycardia 01/20/2015  . Bilateral leg edema 01/20/2015  . TOBACCO ABUSE 05/11/2009  . CARCINOMA, LUNG, SMALL CELL 04/27/2009  . COPD (chronic obstructive pulmonary disease) with chronic bronchitis ([place]) 04/27/2009  . 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  BEHAVIORAL SYMPTOMS/MOOD NEUROLOGICAL BOWEL NUTRITION STATUS      Continent Diet (Regular Diet)  AMBULATORY STATUS COMMUNICATION OF NEEDS Skin   Limited Assist Verbally Normal                       Personal Care Assistance Level of Assistance  Total care       Total Care Assistance: Limited assistance   Functional Limitations Info             SPECIAL CARE FACTORS FREQUENCY  PT (By licensed PT), OT (By licensed OT)     PT Frequency: 5x per week OT Frequency:  5x per week            Contractures Contractures Info: Not present    Additional Factors Info  Allergies   Allergies Info: Codeine; morphine; budeson-formaterol fumarate; shellfish products; iodine           Current Medications (09/15/2015):  This is the current hospital active medication list No current facility-administered medications for this encounter.   Current Outpatient Prescriptions  Medication Sig Dispense Refill  . AMBULATORY NON FORMULARY MEDICATION Compression stockings bilateral knee highs  for bilateral leg/ankle edema 2 Device 0  . BIOTIN 5000 PO Take by mouth daily.    . budesonide-formoterol (SYMBICORT) 160-4.5 MCG/ACT inhaler Inhale 2 puffs into the lungs 2 (two) times daily. 3 Inhaler 12  . Cholecalciferol (VITAMIN D PO) Take by mouth.    . clobetasol cream (TEMOVATE) 0.05 % Use daily for 1 month 45 g 0  . fluconazole (DIFLUCAN) 150 MG tablet Take 1 tablet (150 mg total) by mouth once. 1 tablet 1  . hydrochlorothiazide (HYDRODIURIL) 12.5 MG tablet Take 1 tablet (12.5 mg total) by mouth daily. 30 tablet 2  . metoprolol (LOPRESSOR) 50 MG tablet Take 2 tablets (100 mg total) by mouth 2 (two) times daily. 120 tablet 2  . metroNIDAZOLE (FLAGYL) 500 MG tablet Take 1 tablet (500 mg total) by mouth 2 (two) times daily. 14 tablet 0  . naproxen (NAPROSYN) 250 MG tablet Take 1 tablet (250  mg total) by mouth 2 (two) times daily with a meal. 20 tablet 0  . primidone (MYSOLINE) 250 MG tablet Take 1.5 tablets (375 mg total) by mouth 1 day or 1 dose. 45 tablet 5  . traMADol (ULTRAM) 50 MG tablet Take 1 tablet (50 mg total) by mouth every 8 (eight) hours as needed for severe pain. 15 tablet 0     Discharge Medications: Please see discharge summary for a list of discharge medications.  Relevant Imaging Results:  Relevant Lab Results:   Additional Information    Judeth Horn, LCSW

## 2015-09-15 NOTE — Evaluation (Signed)
Physical Therapy Evaluation Patient Details Name: Sherri Singh MRN: [id number] DOB: 1941-04-30 Today's Date: 09/15/2015   History of Present Illness  Patient presents with weakness. She is here with family and was sent over by her PCP. Per report, there is been a progressive decline in her weakness. She has a history of a tremor with deep brain stimulator in place. She also has a history of lung cancer in remission, as well as hypertension. Her family members don't think that she's taking her medication as directed. She's had several falls over the last week. She states that she's had some increased weakness over the last week and hasn't been able to walk. The patient reports that she's not able to walk due to her back pain. She developed back pain after a fall earlier this week. She had x-rays of lumbar spine which showed some mild compression of T12 but otherwise unremarkable. She had x-rays of her pelvis which were negative as well. She denies any other injuries from the fall. She does say that she's had her head but denies any headache or nausea and vomiting. No recent illnesses no fevers cough colds. The family states that they're not able to take care of the patient at home. Patient has lived by herself but had family member coming by to help her. They're not able to provide 24-hour care. They are requesting the patient be admitted to a skilled nursing facility.  Clinical Impression  Pt admitted with above diagnosis. Pt currently with functional limitations due to the deficits listed below (see PT Problem List). Pt requires 2 person assist at mod assist level.  Will need SNF for rehab.  Pt will benefit from skilled PT to increase their independence and safety with mobility to allow discharge to the venue listed below.      Follow Up Recommendations SNF;Supervision/Assistance - 24 hour    Equipment Recommendations  Other (comment) (TBA)    Recommendations for Other Services        Precautions / Restrictions Precautions Precautions: Fall Restrictions Weight Bearing Restrictions: No      Mobility  Bed Mobility Overal bed mobility: Needs Assistance;+2 for physical assistance Bed Mobility: Supine to Sit     Supine to sit: Mod assist;+2 for physical assistance     General bed mobility comments: Pt needed mod assist to elevate trunk and for LES.  Posterior lean   Transfers Overall transfer level: Needs assistance Equipment used: 2 person hand held assist Transfers: Sit to/from [facility] Sit to Stand: Mod assist;+2 physical assistance;From elevated surface Stand pivot transfers: Mod assist;+2 physical assistance;From elevated surface       General transfer comment: Pt needed assist to power up. Relies on UE support.  Pt unsteady on her feet needing cues to stand tall as initialy she kept her knees bent.  Pt then stood up with bil UE support fully.  Was able to stand pivot to chair with mod assist.   Ambulation/Gait Ambulation/Gait assistance: Mod assist;+2 physical assistance Ambulation Distance (Feet): 7 Feet Assistive device: 2 person hand held assist Gait Pattern/deviations: Step-to pattern;Decreased step length - right;Decreased step length - left;Decreased stride length;Shuffle;Antalgic;Narrow base of support   Gait velocity interpretation: Below normal speed for age/gender General Gait Details: Pt able to step to side forward and back with cues and mod assist with bil UE support most of time.  Pt with posterior lean causing need for mod assist.    Stairs  Wheelchair Mobility    Modified Rankin (Stroke Patients Only)       Balance Overall balance assessment: Needs assistance;History of Falls Sitting-balance support: Bilateral upper extremity supported;Feet supported Sitting balance-Leahy Scale: Poor Sitting balance - Comments: posterior lean Postural control: Posterior lean Standing balance support:  Bilateral upper extremity supported;During functional activity Standing balance-Leahy Scale: Poor Standing balance comment: posterior lean static and dynamic stance causing need for mod assist.              High level balance activites: Side stepping;Backward walking;Direction changes High Level Balance Comments: pt needed mod assist for above with bil UE support.              Pertinent Vitals/Pain Pain Assessment: 0-10 Pain Score: 6  Faces Pain Scale: Hurts even more Pain Location: back Pain Descriptors / Indicators: Aching;Grimacing;Guarding Pain Intervention(s): Limited activity within patient's tolerance;Monitored during session;Repositioned  VSS    Home Living Family/patient expects to be discharged to:: Skilled nursing facility                      Prior Function Level of Independence: Needs assistance   Gait / Transfers Assistance Needed: min to mod assist  ADL's / Homemaking Assistance Needed: mod assist        Hand Dominance        Extremity/Trunk Assessment   Upper Extremity Assessment: Generalized weakness           Lower Extremity Assessment: Generalized weakness      Cervical / Trunk Assessment: Kyphotic  Communication   Communication: No difficulties  Cognition Arousal/Alertness: Awake/alert Behavior During Therapy: Anxious Overall Cognitive Status: History of cognitive impairments - at baseline                      General Comments      Exercises        Assessment/Plan    PT Assessment Patient needs continued PT services  PT Diagnosis Generalized weakness;Acute pain   PT Problem List Decreased activity tolerance;Decreased balance;Decreased mobility;Decreased knowledge of use of DME;Decreased safety awareness;Decreased knowledge of precautions;Pain;Decreased strength  PT Treatment Interventions DME instruction;Gait training;Functional mobility training;Therapeutic activities;Therapeutic exercise;Balance  training;Patient/family education   PT Goals (Current goals can be found in the Care Plan section) Acute Rehab PT Goals Patient Stated Goal: to go to rehab and get back home PT Goal Formulation: With patient Time For Goal Achievement: 09/29/15 Potential to Achieve Goals: Fair    Frequency Min 2X/week   Barriers to discharge Decreased caregiver support      Co-evaluation PT/OT/SLP Co-Evaluation/Treatment: Yes Reason for Co-Treatment: For patient/therapist safety;Complexity of the patient's impairments (multi-system involvement) PT goals addressed during session: Mobility/safety with mobility         End of Session Equipment Utilized During Treatment: Gait belt Activity Tolerance: Patient limited by fatigue;Patient limited by pain Patient left: in bed;with call bell/phone within reach;with family/visitor present Nurse Communication: Mobility status         Time: 4098-1191 PT Time Calculation (min) (ACUTE ONLY): 13 min   Charges:   PT Evaluation $Initial PT Evaluation Tier I: 1 Procedure     PT G CodesDenice Paradise 2015/10/14, 3:33 PM [facility] [phone] (pager)

## 2015-09-15 NOTE — NC FL2 (Signed)
[place] LEVEL OF CARE SCREENING TOOL     IDENTIFICATION  Patient Name: Sherri Singh Birthdate: [birth year] Sex: female Admission Date (Current Location): 09/15/2015  [facility] and [place] Number:  Herbalist and Address:  [facility] [place], [place] [address], [place], [place] [address]      Provider Number: [id number]  Attending Physician Name and Address:  Malvin Johns, MD  Relative Name and Phone Number:   Daiva Nakayama Rhylen Pulido [phone])    Current Level of Care: Hospital Recommended Level of Care: [place] Prior Approval Number:    Date Approved/Denied:   PASRR Number: [id number]  Discharge Plan: SNF    Current Diagnoses: Patient Active Problem List   Diagnosis Date Noted  . DNR (do not resuscitate) 09/14/2015  . Fall 09/13/2015  . Vaginal irritation 07/18/2015  . Aortic valve regurgitation 05/15/2015  . Fracture of sacrum ([place]) 04/26/2015  . Coccyx pain 04/12/2015  . Essential hypertension, benign 01/27/2015  . Essential tremor 01/22/2015  . Tachycardia 01/20/2015  . Bilateral leg edema 01/20/2015  . TOBACCO ABUSE 05/11/2009  . CARCINOMA, LUNG, SMALL CELL 04/27/2009  . COPD (chronic obstructive pulmonary disease) with chronic bronchitis ([place]) 04/27/2009  . 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  BEHAVIORAL SYMPTOMS/MOOD NEUROLOGICAL BOWEL NUTRITION STATUS      Continent Diet (Regular Diet)  AMBULATORY STATUS COMMUNICATION OF NEEDS Skin   Limited Assist Verbally Normal                       Personal Care Assistance Level of Assistance  Total care       Total Care Assistance: Limited assistance   Functional Limitations Info             SPECIAL CARE FACTORS FREQUENCY  PT (By licensed PT), OT (By licensed OT)     PT Frequency: 5x per week OT Frequency:  5x per week            Contractures Contractures Info: Not present    Additional Factors Info  Allergies   Allergies Info: Codeine; morphine; budeson-formaterol fumarate; shellfish products; iodine           Current Medications (09/15/2015):  This is the current hospital active medication list No current facility-administered medications for this encounter.   Current Outpatient Prescriptions  Medication Sig Dispense Refill  . AMBULATORY NON FORMULARY MEDICATION Compression stockings bilateral knee highs  for bilateral leg/ankle edema 2 Device 0  . BIOTIN 5000 PO Take by mouth daily.    . budesonide-formoterol (SYMBICORT) 160-4.5 MCG/ACT inhaler Inhale 2 puffs into the lungs 2 (two) times daily. 3 Inhaler 12  . Cholecalciferol (VITAMIN D PO) Take by mouth.    . clobetasol cream (TEMOVATE) 0.05 % Use daily for 1 month 45 g 0  . fluconazole (DIFLUCAN) 150 MG tablet Take 1 tablet (150 mg total) by mouth once. 1 tablet 1  . hydrochlorothiazide (HYDRODIURIL) 12.5 MG tablet Take 1 tablet (12.5 mg total) by mouth daily. 30 tablet 2  . metoprolol (LOPRESSOR) 50 MG tablet Take 2 tablets (100 mg total) by mouth 2 (two) times daily. 120 tablet 2  . metroNIDAZOLE (FLAGYL) 500 MG tablet Take 1 tablet (500 mg total) by mouth 2 (two) times daily. 14 tablet 0  . naproxen (NAPROSYN) 250 MG tablet Take 1 tablet (250  mg total) by mouth 2 (two) times daily with a meal. 20 tablet 0  . primidone (MYSOLINE) 250 MG tablet Take 1.5 tablets (375 mg total) by mouth 1 day or 1 dose. 45 tablet 5  . traMADol (ULTRAM) 50 MG tablet Take 1 tablet (50 mg total) by mouth every 8 (eight) hours as needed for severe pain. 15 tablet 0     Discharge Medications: Please see discharge summary for a list of discharge medications.  Relevant Imaging Results:  Relevant Lab Results:   Additional Information    Judeth Horn, LCSW

## 2015-09-15 NOTE — ED Notes (Signed)
Per family, pt was sent here from pcp due to frequent falls and possible NH placement.

## 2015-09-15 NOTE — ED Provider Notes (Signed)
Pt here for nursing home placement, UA c/w UTI - will treat with keflex, cultures sent.  Pt d/cd to SNF for rehab.    Quintella Reichert, MD 09/16/15 [phone]

## 2015-09-15 NOTE — Evaluation (Signed)
Occupational Therapy Evaluation and Discharge Patient Details Name: Sherri Singh MRN: [id number] DOB: [birth year] Today's Date: 09/15/2015    History of Present Illness Patient presents with weakness. She is here with family and was sent over by her PCP. Per report, there is been a progressive decline in her weakness. She has a history of a tremor with deep brain stimulator in place. She also has a history of lung cancer in remission, as well as hypertension. Her family members don't think that she's taking her medication as directed. She's had several falls over the last week. She states that she's had some increased weakness over the last week and hasn't been able to walk. The patient reports that she's not able to walk due to her back pain. She developed back pain after a fall earlier this week. She had x-rays of lumbar spine which showed some mild compression of T12 but otherwise unremarkable. She had x-rays of her pelvis which were negative as well. She denies any other injuries from the fall. She does say that she's had her head but denies any headache or nausea and vomiting. No recent illnesses no fevers cough colds. The family states that they're not able to take care of the patient at home. Patient has lived by herself but had family member coming by to help her. They're not able to provide 24-hour care. They are requesting the patient be admitted to a skilled nursing facility.   Clinical Impression   This 74 yo female seen in emergency room with above presents to acute OT with decreased balance, decreased mobility, increased pain all affecting her ability to care for herself at home and increasing burden of care on her family. She will benefit from continued OT at SNF, acute OT will D/C.    Follow Up Recommendations  SNF    Equipment Recommendations  None recommended by OT       Precautions / Restrictions Precautions Precautions: Fall Restrictions Weight Bearing Restrictions: No       Mobility Bed Mobility Overal bed mobility: Needs Assistance;+2 for physical assistance Bed Mobility: Supine to Sit     Supine to sit: Mod assist;+2 for physical assistance     General bed mobility comments: Pt needed mod assist to elevate trunk and for LES.  Posterior lean   Transfers Overall transfer level: Needs assistance Equipment used: 2 person hand held assist Transfers: Sit to/from [facility] Sit to Stand: Mod assist;+2 physical assistance;From elevated surface Stand pivot transfers: Mod assist;+2 physical assistance;From elevated surface       General transfer comment: Pt needed assist to power up. Relies on UE support.  Pt unsteady on her feet needing cues to stand tall as initialy she kept her knees bent.  Pt then stood up with bil UE support fully.  Was able to stand pivot to chair with mod assist.     Balance Overall balance assessment: Needs assistance;History of Falls Sitting-balance support: Bilateral upper extremity supported;Feet supported Sitting balance-Leahy Scale: Poor Sitting balance - Comments: posterior lean Postural control: Posterior lean Standing balance support: Bilateral upper extremity supported;During functional activity Standing balance-Leahy Scale: Poor Standing balance comment: posterior lean static and dynamic stance causing need for mod assist.              High level balance activites: Side stepping;Backward walking;Direction changes High Level Balance Comments: pt needed mod assist for above with bil UE support.             ADL Overall  ADL's : Needs assistance/impaired Eating/Feeding: Set up;Supervision/ safety;Sitting   Grooming: Set up;Supervision/safety;Sitting   Upper Body Bathing: Set up;Supervision/ safety;Sitting   Lower Body Bathing: Moderate assistance (with Mod A +2 sit<>stand)   Upper Body Dressing : Minimal assistance;Sitting   Lower Body Dressing: Maximal assistance (with Mod A +2  sit<>stand)   Toilet Transfer: Moderate assistance +2;Stand-pivot   Toileting- Clothing Manipulation and Hygiene: Maximal assistance (mod A +2 sit<>stand)                         Pertinent Vitals/Pain Pain Assessment: 0-10 Pain Score: 6  Faces Pain Scale: Hurts even more Pain Location: back Pain Descriptors / Indicators: Aching;Grimacing;Guarding Pain Intervention(s): Limited activity within patient's tolerance;Monitored during session;Repositioned        Extremity/Trunk Assessment Upper Extremity Assessment Upper Extremity Assessment: Generalized weakness   Lower Extremity Assessment Lower Extremity Assessment: Generalized weakness   Cervical / Trunk Assessment Cervical / Trunk Assessment: Kyphotic   Communication Communication Communication: No difficulties   Cognition Arousal/Alertness: Awake/alert Behavior During Therapy: Anxious Overall Cognitive Status: History of cognitive impairments - at baseline                                Home Living Family/patient expects to be discharged to:: Skilled nursing facility                                        Prior Functioning/Environment Level of Independence: Needs assistance  Gait / Transfers Assistance Needed: min to mod assist ADL's / Homemaking Assistance Needed: mod assist        OT Diagnosis: Generalized weakness;Acute pain         OT Goals(Current goals can be found in the care plan section) Acute Rehab OT Goals Patient Stated Goal: to go to rehab and get back home OT Goal Formulation: With patient/family  OT Frequency:             Co-evaluation   Reason for Co-Treatment: For patient/therapist safety;Complexity of the patient's impairments (multi-system involvement) PT goals addressed during session: Mobility/safety with mobility        End of Session Equipment Utilized During Treatment: Gait belt Nurse Communication:  (+2 A, needs SNF)  Activity  Tolerance: Patient limited by fatigue Patient left: in bed;with family/visitor present   Time: 4540-9811 OT Time Calculation (min): 18 min Charges:  OT General Charges $OT Visit: 1 Procedure OT Evaluation $Initial OT Evaluation Tier I: 1 Procedure G-Codes: OT G-codes **NOT FOR INPATIENT CLASS** Functional Assessment Tool Used: Clinical observation Functional Limitation: Self care Self Care Current Status (B1478): At least 60 percent but less than 80 percent impaired, limited or restricted Self Care Goal Status (G9562): At least 60 percent but less than 80 percent impaired, limited or restricted Self Care Discharge Status [phone]): At least 60 percent but less than 80 percent impaired, limited or restricted  Almon Register [phone] 09/15/2015, 3:39 PM

## 2015-09-15 NOTE — ED Notes (Signed)
[place] and spoke to [place] , report given,.

## 2015-09-15 NOTE — Progress Notes (Signed)
Sherri Singh is a 74 y.o. female who presents to [facility] today for follow-up fall. Patient suffered several falls in the last month. She was seen in the emergency department recently. She followed up 2 days ago after her most recent fall. At that time she was determined to not go to the hospital or a nursing home. Home health agency was ordered and labs were obtained. Labs showed mildly elevated CK in the 500s but the creatinine was preserved. In the interval she's had discussions with her family about safety at home and she is here today to have a family discussion about safety at home and possible admission to a nursing home.  The family notes that they do not have the resources needed for 24 hour help. They note that Sherri Singh is unable to stand unassisted and walk unassisted. She cannot use the toilet without aid. They do not think that it is safe to stay at home.    Past Medical History  Diagnosis Date  . Lung cancer ([place])     lung ca dx 04/2009  . Benign essential tremor   . Lung cancer ([place])   . COPD (chronic obstructive pulmonary disease) [facility])    Past Surgical History  Procedure Laterality Date  . Deep brain stimulator placement    . Mouth surgery    . Breast surgery    . Appendectomy    . Ovary surgery     Social History  Substance Use Topics  . Smoking status: Former Smoker -- 77 years    Quit date: 09/23/1997  . Smokeless tobacco: Not on file  . Alcohol Use: No   family history includes Cancer in her mother; Diabetes in her mother; Heart attack in her mother.  ROS as above Medications: Current Outpatient Prescriptions  Medication Sig Dispense Refill  . AMBULATORY NON FORMULARY MEDICATION Compression stockings bilateral knee highs  for bilateral leg/ankle edema 2 Device 0  . BIOTIN 5000 PO Take by mouth daily.    . budesonide-formoterol (SYMBICORT) 160-4.5  MCG/ACT inhaler Inhale 2 puffs into the lungs 2 (two) times daily. 3 Inhaler 12  . Cholecalciferol (VITAMIN D PO) Take by mouth.    . clobetasol cream (TEMOVATE) 0.05 % Use daily for 1 month 45 g 0  . fluconazole (DIFLUCAN) 150 MG tablet Take 1 tablet (150 mg total) by mouth once. 1 tablet 1  . hydrochlorothiazide (HYDRODIURIL) 12.5 MG tablet Take 1 tablet (12.5 mg total) by mouth daily. 30 tablet 2  . metoprolol (LOPRESSOR) 50 MG tablet Take 2 tablets (100 mg total) by mouth 2 (two) times daily. 120 tablet 2  . metroNIDAZOLE (FLAGYL) 500 MG tablet Take 1 tablet (500 mg total) by mouth 2 (two) times daily. 14 tablet 0  . naproxen (NAPROSYN) 250 MG tablet Take 1 tablet (250 mg total) by mouth 2 (two) times daily with a meal. 20 tablet 0  . primidone (MYSOLINE) 250 MG tablet Take 1.5 tablets (375 mg total) by mouth 1 day or 1 dose. 45 tablet 5  . traMADol (ULTRAM) 50 MG tablet Take 1 tablet (50 mg total) by mouth every 8 (eight) hours as needed for severe pain. 15 tablet 0   No current facility-administered medications for this visit.   Allergies  Allergen Reactions  . Codeine Other (See Comments)  . Other Other (See Comments)    Peanuts, Causes headaches Allergic to a type of pain medication, unable to recall name  of medication at this time  . Budesonide-Formoterol Fumarate     REACTION: hoarseness  . Iohexol      Code: HIVES, Desc: pt developed hives 30+ yrs ago; needs 13 hour prep in future per Dr Leonia Reeves; 06/26/09 slg   . Morphine Other (See Comments)    Unsure of Reaction  . Shellfish-Derived Products Other (See Comments)    Positive allergy test- has never had a reaction  . Iodine Rash    Boils on skin     Exam:  BP 105/47 mmHg  Pulse 62 Gen: Well NAD HEENT: EOMI,  MMM Lungs: Normal work of breathing. CTABL Heart: RRR no MRG Abd: NABS, Soft. Nondistended, Nontender Exts: Brisk capillary refill, warm and well perfused.  Back: Nontender to spinal midline especially at the  T12 area. Tender palpation bilateral lower lumbar paraspinals and posterior pelvis Neuro: Alert and oriented x 3. Reduced bilateral tremor. Difficulty standing unassisted. Wide based shuffling gait.    No results found for this or any previous visit (from the past 24 hour(s)). Dg Lumbar Spine Complete  09/13/2015  CLINICAL DATA:  Status post fall at home yesterday, right-sided lumbar region pain radiating down the leg, previous fall approximately 1 month ago EXAM: [place] 4+ VIEW COMPARISON:  Lumbar spine series of April 12, 2015 as well as a chest CT scan dated June 05, 2011 for purposes of numbering of the vertebral levels. FINDINGS: The visualized thoracic and lumbar vertebral bodies are osteopenic. The twelfth ribs are hypoplastic. The lumbar vertebral bodies are preserved in height. There is new 10% anterior compression of T12. There is no retropulsion of bone. The lumbar disc space heights are well maintained. There is no spondylolisthesis. There is mild facet joint hypertrophy at L5-S1. The pedicles and transverse processes are intact. IMPRESSION: 1. 10% anterior compression of T12 new since July 2016. 2. No compression fracture or other acute bony abnormality of the lumbar spine. The disc space heights are well maintained. Electronically Signed   By: David  Martinique M.D.   On: 09/13/2015 16:49   Dg Pelvis 1-2 Views  09/13/2015  CLINICAL DATA:  Status post fall at home yesterday, right low back pain radiating into the right leg, previous fall approximately 1 month ago. EXAM: PELVIS - 1-2 VIEW COMPARISON:  CT scan of the pelvis of April 26, 2015 FINDINGS: The bony pelvis is osteopenic. The iliac bones and pubic bones are intact. The sacrum and SI joints are normal where visualized. The hip joint spaces are reasonably well maintained. The femoral heads, necks, intertrochanteric, and subtrochanteric regions are normal. There are surgical clips within the pelvis. The bowel gas pattern  is unremarkable. IMPRESSION: There is no acute bony abnormality of the pelvis. Electronically Signed   By: David  Martinique M.D.   On: 09/13/2015 16:50     Please see individual assessment and plan sections.  Over 40 minutes were spent more than 50% face-to-face during patient encounter.

## 2015-09-15 NOTE — Discharge Instructions (Signed)
Sherri Singh was diagnosed with a urinary tract infection in the Emergency Department.  Get her rechecked immediately if she develops fevers or new concerning symptoms.    Urinary Tract Infection Urinary tract infections (UTIs) can develop anywhere along your urinary tract. Your urinary tract is your body's drainage system for removing wastes and extra water. Your urinary tract includes two kidneys, two ureters, a bladder, and a urethra. Your kidneys are a pair of bean-shaped organs. Each kidney is about the size of your fist. They are located below your ribs, one on each side of your spine. CAUSES Infections are caused by microbes, which are microscopic organisms, including fungi, viruses, and bacteria. These organisms are so small that they can only be seen through a microscope. Bacteria are the microbes that most commonly cause UTIs. SYMPTOMS  Symptoms of UTIs may vary by age and gender of the patient and by the location of the infection. Symptoms in young women typically include a frequent and intense urge to urinate and a painful, burning feeling in the bladder or urethra during urination. Older women and men are more likely to be tired, shaky, and weak and have muscle aches and abdominal pain. A fever may mean the infection is in your kidneys. Other symptoms of a kidney infection include pain in your back or sides below the ribs, nausea, and vomiting. DIAGNOSIS To diagnose a UTI, your caregiver will ask you about your symptoms. Your caregiver will also ask you to provide a urine sample. The urine sample will be tested for bacteria and white blood cells. White blood cells are made by your body to help fight infection. TREATMENT  Typically, UTIs can be treated with medication. Because most UTIs are caused by a bacterial infection, they usually can be treated with the use of antibiotics. The choice of antibiotic and length of treatment depend on your symptoms and the type of bacteria causing your  infection. HOME CARE INSTRUCTIONS  If you were prescribed antibiotics, take them exactly as your caregiver instructs you. Finish the medication even if you feel better after you have only taken some of the medication.  Drink enough water and fluids to keep your urine clear or pale yellow.  Avoid caffeine, tea, and carbonated beverages. They tend to irritate your bladder.  Empty your bladder often. Avoid holding urine for long periods of time.  Empty your bladder before and after sexual intercourse.  After a bowel movement, women should cleanse from front to back. Use each tissue only once. SEEK MEDICAL CARE IF:   You have back pain.  You develop a fever.  Your symptoms do not begin to resolve within 3 days. SEEK IMMEDIATE MEDICAL CARE IF:   You have severe back pain or lower abdominal pain.  You develop chills.  You have nausea or vomiting.  You have continued burning or discomfort with urination. MAKE SURE YOU:   Understand these instructions.  Will watch your condition.  Will get help right away if you are not doing well or get worse.   This information is not intended to replace advice given to you by your health care provider. Make sure you discuss any questions you have with your health care provider.   Document Released: 06/19/2005 Document Revised: 05/31/2015 Document Reviewed: 10/18/2011 Elsevier Interactive Patient Education 2016 [place] in Hospitals, Adult As a hospital patient, your condition and the treatments you receive can increase your risk for falls. Some additional risk factors for falls in a hospital  include:  Being in an unfamiliar environment.  Being on bed rest.  Your surgery.  Taking certain medicines.  Your tubing requirements, such as intravenous (IV) therapy or catheters. It is important that you learn how to decrease fall risks while at the hospital. Below are important tips that can help prevent falls. SAFETY  TIPS FOR PREVENTING FALLS Talk about your risk of falling.  Ask your health care provider why you are at risk for falling. Is it your medicine, illness, tubing placement, or something else?  Make a plan with your health care provider to keep you safe from falls.  Ask your health care provider or pharmacist about side effects of your medicines. Some medicines can make you dizzy or affect your coordination. Ask for help.  Ask for help before getting out of bed. You may need to press your call button.  Ask for assistance in getting safely to the toilet.  Ask for a walker or cane to be put at your bedside. Ask that most of the side rails on your bed be placed up before your health care provider leaves the room.  Ask family or friends to sit with you.  Ask for things that are out of your reach, such as your glasses, hearing aids, telephone, bedside table, or call button. Follow these tips to avoid falling:  Stay lying or seated, rather than standing, while waiting for help.  Wear rubber-soled slippers or shoes whenever you walk in the hospital.  Avoid quick, sudden movements.  Change positions slowly.  Sit on the side of your bed before standing.  Stand up slowly and wait before you start to walk.  Let your health care provider know if there is a spill on the floor.  Pay careful attention to the medical equipment, electrical cords, and tubes around you.  When you need help, use your call button by your bed or in the bathroom. Wait for one of your health care providers to help you.  If you feel dizzy or unsure of your footing, return to bed and wait for assistance.  Avoid being distracted by the TV, telephone, or another person in your room.  Do not lean or support yourself on rolling objects, such as IV poles or bedside tables.   This information is not intended to replace advice given to you by your health care provider. Make sure you discuss any questions you have with your  health care provider.   Document Released: 09/06/2000 Document Revised: 09/30/2014 Document Reviewed: 05/17/2012 Elsevier Interactive Patient Education [employer].

## 2015-09-15 NOTE — ED Notes (Signed)
Spoke with [name] our Education officer, museum and she has requested that the pt. Have a PT and OT evaluation for today for SNF placement.

## 2015-09-15 NOTE — Patient Instructions (Signed)
Thank you for coming in today. Go to [facility] ED for placement.  [name] the ED social worker has been helping with this.

## 2015-09-18 LAB — URINE CULTURE: Culture: >=100000

## 2015-09-19 ENCOUNTER — Non-Acute Institutional Stay (SKILLED_NURSING_FACILITY): Payer: Medicare Other | Admitting: Internal Medicine

## 2015-09-19 ENCOUNTER — Telehealth (HOSPITAL_COMMUNITY): Payer: Self-pay

## 2015-09-19 ENCOUNTER — Encounter: Payer: Self-pay | Admitting: Internal Medicine

## 2015-09-19 DIAGNOSIS — R600 Localized edema: Secondary | ICD-10-CM

## 2015-09-19 DIAGNOSIS — Z85118 Personal history of other malignant neoplasm of bronchus and lung: Secondary | ICD-10-CM | POA: Diagnosis not present

## 2015-09-19 DIAGNOSIS — J449 Chronic obstructive pulmonary disease, unspecified: Secondary | ICD-10-CM

## 2015-09-19 DIAGNOSIS — N3000 Acute cystitis without hematuria: Secondary | ICD-10-CM | POA: Diagnosis not present

## 2015-09-19 DIAGNOSIS — G250 Essential tremor: Secondary | ICD-10-CM

## 2015-09-19 DIAGNOSIS — Z79899 Other long term (current) drug therapy: Secondary | ICD-10-CM | POA: Diagnosis not present

## 2015-09-19 DIAGNOSIS — I10 Essential (primary) hypertension: Secondary | ICD-10-CM | POA: Diagnosis not present

## 2015-09-19 DIAGNOSIS — Z4549 Encounter for adjustment and management of other implanted nervous system device: Secondary | ICD-10-CM | POA: Diagnosis not present

## 2015-09-19 DIAGNOSIS — R296 Repeated falls: Secondary | ICD-10-CM

## 2015-09-19 DIAGNOSIS — R2689 Other abnormalities of gait and mobility: Secondary | ICD-10-CM | POA: Diagnosis not present

## 2015-09-19 DIAGNOSIS — Z9181 History of falling: Secondary | ICD-10-CM | POA: Diagnosis not present

## 2015-09-19 NOTE — Telephone Encounter (Signed)
Post ED Visit - Positive Culture Follow-up  Culture report reviewed by antimicrobial stewardship pharmacist:  '[]'$  Elenor Quinones, Pharm.D. '[]'$  Heide Guile, Pharm.D., BCPS '[]'$  Parks Neptune, Pharm.D. '[]'$  Alycia Rossetti, Pharm.D., BCPS '[]'$  [place], Pharm.D., BCPS, AAHIVP '[]'$  Legrand Como, Pharm.D., BCPS, AAHIVP '[]'$  Milus Glazier, Pharm.D. '[]'$  Stephens November, Pharm.D. Tona Sensing, Pharm.D.  Positive urine culture, >/= 100,000 colonies -> E Coli Treated with Cephalexin, organism sensitive to the same and no further patient follow-up is required at this time.  Dortha Kern 09/19/2015, 11:11 AM

## 2015-09-19 NOTE — Progress Notes (Signed)
MRN: [id number] Name: Sherri Singh  Sex: female Age: 74 y.o. DOB: 05/05/1941  [place] #: Karren [facility]ly Facility/Room:120B Level Of Care: SNF Provider: Inocencio Homes D Emergency Contacts: Extended Emergency Contact Information Primary Emergency Contact: Don Perking [place] Phone: [phone] Relation: Son  Code Status:   Allergies: Codeine; Other; Budesonide-formoterol fumarate; Iohexol; Morphine; Shellfish-derived products; and Iodine  Chief Complaint  Patient presents with  . New Admit To SNF    HPI: Patient is 74 y.o. female  with weakness. She is here with family and was sent over to ED by her PCP. Per report, there is been a progressive decline in her weakness. She has a history of a tremor with deep brain stimulator in place. She also has a history of lung cancer in remission, as well as hypertension. Her family members don't think that she's taking her medication as directed. She's had several falls over the last week. She states that she's had some increased weakness over the last week and hasn't been able to walk. The patient reports that she's not able to walk due to her back pain. She developed back pain after a fall earlier this week. She had x-rays of lumbar spine which showed some mild compression of T12 but otherwise unremarkable. She had x-rays of her pelvis which were negative as well. She denies any other injuries from the fall. She does say that she's had her head but denies any headache or nausea and vomiting. No recent illnesses no fevers cough colds. The family states that they're not able to take care of the patient at home. Patient has lived by herself but had family member coming by to help her. They're not able to provide 24-hour care.Pt is admitted to SNF for residential care. While at SNF pt will be followed for HTN tx with HCTZ and metoprolol, Vit D def , tx with supplementation and COPD, tx with symbicort.  Past Medical History  Diagnosis  Date  . Lung cancer ([place])     lung ca dx 04/2009  . Benign essential tremor   . Lung cancer ([place])   . COPD (chronic obstructive pulmonary disease) [facility])     Past Surgical History  Procedure Laterality Date  . Deep brain stimulator placement    . Mouth surgery    . Breast surgery    . Appendectomy    . Ovary surgery        Medication List       This list is accurate as of: 09/19/15 11:59 PM.  Always use your most recent med list.               AMBULATORY NON FORMULARY MEDICATION  Compression stockings bilateral knee highs  for bilateral leg/ankle edema     BIOTIN 5000 PO  Take by mouth daily.     budesonide-formoterol 160-4.5 MCG/ACT inhaler  Commonly known as:  SYMBICORT  Inhale 2 puffs into the lungs 2 (two) times daily.     cephALEXin 500 MG capsule  Commonly known as:  KEFLEX  Take 1 capsule (500 mg total) by mouth 2 (two) times daily.     hydrochlorothiazide 12.5 MG tablet  Commonly known as:  HYDRODIURIL  Take 1 tablet (12.5 mg total) by mouth daily.     metoprolol 50 MG tablet  Commonly known as:  LOPRESSOR  Take 2 tablets (100 mg total) by mouth 2 (two) times daily.     primidone 250 MG tablet  Commonly known as:  MYSOLINE  Take 1.5 tablets (375 mg total) by mouth 1 day or 1 dose.     traMADol 50 MG tablet  Commonly known as:  ULTRAM  Take 1 tablet (50 mg total) by mouth every 8 (eight) hours as needed for severe pain.     VITAMIN D PO  Take 1 tablet by mouth daily.        No orders of the defined types were placed in this encounter.    Immunization History  Administered Date(s) Administered  . Pneumococcal Conjugate-13 01/20/2015  . Tdap 03/07/2012    Social History  Substance Use Topics  . Smoking status: Former Smoker -- 32 years    Quit date: 09/23/1997  . Smokeless tobacco: Not on file  . Alcohol Use: No    Family history is +CA, DM2,MI   Review of Systems  DATA OBTAINED: from patient, nurse GENERAL:  no fevers, +fatigue,  appetite changes SKIN: No itching, rash or wounds EYES: No eye pain, redness, discharge EARS: No earache, tinnitus, change in hearing NOSE: No congestion, drainage or bleeding  MOUTH/THROAT: No mouth or tooth pain, No sore throat RESPIRATORY: No cough, wheezing, SOB CARDIAC: No chest pain, palpitations, lower extremity edema  GI: No abdominal pain, No N/V/D or constipation, No heartburn or reflux  GU: No dysuria, frequency or urgency, or incontinence  MUSCULOSKELETAL: No unrelieved bone/joint pain NEUROLOGIC: No headache, dizziness or focal weakness; baseline tremor PSYCHIATRIC: No c/o anxiety or sadness   There were no vitals filed for this visit.  SpO2 Readings from Last 1 Encounters:  09/15/15 99%        Physical Exam  GENERAL APPEARANCE: Alert, conversant,  No acute distress.  SKIN: No diaphoresis rash HEAD: Normocephalic, atraumatic  EYES: Conjunctiva/lids clear. Pupils round, reactive. EOMs intact.  EARS: External exam WNL, canals clear. Hearing grossly normal.  NOSE: No deformity or discharge.  MOUTH/THROAT: Lips w/o lesions  RESPIRATORY: Breathing is even, unlabored. Lung sounds are clear   CARDIOVASCULAR: Heart RRR no murmurs, rubs or gallops. No peripheral edema.   GASTROINTESTINAL: Abdomen is soft, non-tender, not distended w/ normal bowel sounds. GENITOURINARY: Bladder non tender, not distended  MUSCULOSKELETAL: No abnormal joints or musculature NEUROLOGIC:  Cranial nerves 2-12 grossly intact. Moves all extremities  PSYCHIATRIC: Mood and affect appropriate to situation, no behavioral issues  Patient Active Problem List   Diagnosis Date Noted  . UTI (urinary tract infection) 09/25/2015  . DNR (do not resuscitate) 09/14/2015  . Falls frequently 09/13/2015  . Vaginal irritation 07/18/2015  . Aortic valve regurgitation 05/15/2015  . Fracture of sacrum ([place]) 04/26/2015  . Coccyx pain 04/12/2015  . Essential hypertension, benign 01/27/2015  . Essential tremor  01/22/2015  . Tachycardia 01/20/2015  . Bilateral leg edema 01/20/2015  . TOBACCO ABUSE 05/11/2009  . CARCINOMA, LUNG, SMALL CELL 04/27/2009  . COPD (chronic obstructive pulmonary disease) with chronic bronchitis ([place]) 04/27/2009  . 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    CBC    Component Value Date/Time   WBC 6.7 09/15/2015 1408   WBC 5.6 06/05/2011 0848   RBC 4.48 09/15/2015 1408   RBC 4.28 06/05/2011 0848   HGB 13.7 09/15/2015 1408   HGB 13.5 06/05/2011 0848   HCT 41.9 09/15/2015 1408   HCT 39.6 06/05/2011 0848   PLT 170 09/15/2015 1408   PLT 206 06/05/2011 0848   MCV 93.5 09/15/2015 1408   MCV 92.4 06/05/2011 0848   LYMPHSABS 1.4 09/15/2015 1408   LYMPHSABS 0.9 06/05/2011 0848   MONOABS 0.5 09/15/2015 1408  MONOABS 0.1 06/05/2011 0848   EOSABS 0.1 09/15/2015 1408   EOSABS 0.0 06/05/2011 0848   BASOSABS 0.1 09/15/2015 1408   BASOSABS 0.0 06/05/2011 0848    CMP     Component Value Date/Time   NA 140 09/15/2015 1408   NA 147* 06/05/2011 0848   K 3.4* 09/15/2015 1408   K 4.3 06/05/2011 0848   CL 104 09/15/2015 1408   CL 103 06/05/2011 0848   CO2 27 09/15/2015 1408   CO2 27 06/05/2011 0848   GLUCOSE 93 09/15/2015 1408   GLUCOSE 182* 06/05/2011 0848   BUN 20 09/15/2015 1408   BUN 10 06/05/2011 0848   CREATININE 0.74 09/15/2015 1408   CREATININE 0.58* 09/13/2015 1604   CALCIUM 9.8 09/15/2015 1408   CALCIUM 9.7 06/05/2011 0848   PROT 6.6 09/13/2015 1604   PROT 7.4 06/05/2011 0848   ALBUMIN 4.0 09/13/2015 1604   ALBUMIN 3.7 06/05/2011 0848   AST 44* 09/13/2015 1604   AST 20 06/05/2011 0848   ALT 29 09/13/2015 1604   ALT 31 06/05/2011 0848   ALKPHOS 89 09/13/2015 1604   ALKPHOS 94* 06/05/2011 0848   BILITOT 0.8 09/13/2015 1604   BILITOT 0.50 06/05/2011 0848   GFRNONAA >60 09/15/2015 1408   GFRNONAA 85 04/26/2015 0940   GFRAA >60 09/15/2015 1408   GFRAA >89 04/26/2015 0940    No results found for: HGBA1C   Ct Head Wo Contrast  09/15/2015  CLINICAL  DATA:  74 year old with multiple recent falls, personal history of left upper lobe lung cancer, prior deep brain stimulator placement, currently being evaluated for nursing home placement. EXAM: CT HEAD WITHOUT CONTRAST TECHNIQUE: Contiguous axial images were obtained from the base of the skull through the vertex without intravenous contrast. COMPARISON:  Multiple prior head CTs dating back to 05/15/2009, most recently 11/21/2011. FINDINGS: Neurostimulator device leads entering the brain via the frontal bones, the lead tips in the thalami, unchanged. Severe cortical and deep atrophy and mild cerebellar atrophy, not significantly changed since 2013 though progressive since 2011. Severe changes of small vessel disease of the white matter diffusely, unchanged. No mass lesion. No midline shift. No acute hemorrhage or hematoma. No extra-axial fluid collections. No evidence of acute infarction. Bilateral frontal burr holes for the neurostimulator device. No skull fractures or other focal osseous abnormality involving the skull. Mucosal thickening involving the right sphenoid sinus. Remaining visualized paranasal sinuses, bilateral mastoid air cells and bilateral middle ear cavities well-aerated. Moderate bilateral carotid siphon atherosclerosis. IMPRESSION: 1. No acute intracranial abnormality. 2. No evidence of brain or skull metastasis. 3. Moderate to severe generalized atrophy and severe chronic microvascular ischemic changes of the white matter. 4. Mild chronic right sphenoid sinusitis. Electronically Signed   By: Evangeline Dakin M.D.   On: 09/15/2015 14:39    Not all labs, radiology exams or other studies done during hospitalization come through on my EPIC note; however they are reviewed by me.    Assessment and Plan  Falls frequently Pt is being admitted to SNF from ED for generalized weakness with frequent falls; plan OT/PT  UTI (urinary tract infection) Pt was dx with possible UTI while she was in  the ED; will cont keflex started in ED  Essential hypertension, benign Controlled on HCTZ and metoprolol; will follow with BMP  COPD (chronic obstructive pulmonary disease) with chronic bronchitis Not stated as uncontrolled; will cont symbicort BID scheduled  Essential tremor Pt is treated with implantaded electrodes  Bilateral leg edema Chronic, stable and treated with  support stockings   Time spent > 45 min;> 50% of time with patient was spent reviewing records, labs, tests and studies, counseling and developing plan of care  Hennie Duos, MD

## 2015-09-25 ENCOUNTER — Encounter: Payer: Self-pay | Admitting: Internal Medicine

## 2015-09-25 DIAGNOSIS — N390 Urinary tract infection, site not specified: Secondary | ICD-10-CM | POA: Insufficient documentation

## 2015-09-25 NOTE — Assessment & Plan Note (Signed)
Pt was dx with possible UTI while she was in the ED; will cont keflex started in ED

## 2015-09-25 NOTE — Assessment & Plan Note (Signed)
Controlled on HCTZ and metoprolol; will follow with BMP

## 2015-09-25 NOTE — Assessment & Plan Note (Signed)
Pt is being admitted to SNF from ED for generalized weakness with frequent falls; plan OT/PT

## 2015-09-25 NOTE — Assessment & Plan Note (Signed)
Pt is treated with implantaded electrodes

## 2015-09-25 NOTE — Assessment & Plan Note (Signed)
Chronic, stable and treated with support stockings

## 2015-09-25 NOTE — Assessment & Plan Note (Signed)
Not stated as uncontrolled; will cont symbicort BID scheduled

## 2015-10-11 ENCOUNTER — Encounter: Payer: Self-pay | Admitting: Adult Health

## 2015-10-11 ENCOUNTER — Other Ambulatory Visit: Payer: Self-pay | Admitting: Family Medicine

## 2015-10-11 ENCOUNTER — Non-Acute Institutional Stay (SKILLED_NURSING_FACILITY): Payer: Medicare Other | Admitting: Adult Health

## 2015-10-11 DIAGNOSIS — I10 Essential (primary) hypertension: Secondary | ICD-10-CM

## 2015-10-11 DIAGNOSIS — J449 Chronic obstructive pulmonary disease, unspecified: Secondary | ICD-10-CM

## 2015-10-11 DIAGNOSIS — G250 Essential tremor: Secondary | ICD-10-CM

## 2015-10-11 DIAGNOSIS — S32111S Minimally displaced Zone I fracture of sacrum, sequela: Secondary | ICD-10-CM | POA: Diagnosis not present

## 2015-10-11 MED ORDER — TRAMADOL HCL 50 MG PO TABS: 50.0000 mg | ORAL_TABLET | Freq: Three times a day (TID) | ORAL | Status: DC | PRN

## 2015-10-11 NOTE — Progress Notes (Addendum)
Patient ID: Sherri Singh, female   DOB: [birth year], 75 y.o.   MRN: [id number]   Facility:  [facility]    PCP Lynne Leader, MD    Allergies  Allergen Reactions  . Codeine Other (See Comments)  . Other Other (See Comments)    Peanuts, Causes headaches Allergic to a type of pain medication, unable to recall name of medication at this time  . Budesonide-Formoterol Fumarate     REACTION: hoarseness  . Iohexol      Code: HIVES, Desc: pt developed hives 30+ yrs ago; needs 13 hour prep in future per Dr Leonia Reeves; 06/26/09 slg   . Morphine Other (See Comments)    Unsure of Reaction  . Shellfish-Derived Products Other (See Comments)    Positive allergy test- has never had a reaction  . Iodine Rash    Boils on skin    Chief Complaint  Patient presents with  . Discharge Note    HPI:  She is being discharged to home with home health for pt/ot/rn/st. She will need a standard wheelchair with cushion and will need a 3:1 commode. She will need her prescriptions to be written and will need to follow up with her pcp.  She had been seen in the ED for increased weakness and falls. She was admitted to this facility for short term rehab. She is going sooner than she should due to payer issues.   Past Medical History  Diagnosis Date  . Lung cancer ([place])     lung ca dx 04/2009  . Benign essential tremor   . Lung cancer ([place])   . COPD (chronic obstructive pulmonary disease) [facility])     Past Surgical History  Procedure Laterality Date  . Deep brain stimulator placement    . Mouth surgery    . Breast surgery    . Appendectomy    . Ovary surgery      VITAL SIGNS BP 118/72 mmHg  Pulse 78  Temp(Src) 97.3 F (36.3 C)  Resp 20  Ht '5\' 4"'$  (1.626 m)  Wt 118 lb (53.524 kg)  BMI 20.24 kg/m2  SpO2 95%  Patient's Medications  New Prescriptions   No medications on file  Previous Medications   AMBULATORY NON FORMULARY MEDICATION    Compression stockings bilateral knee highs  for bilateral  leg/ankle edema   BIOTIN 5000 PO    Take by mouth daily.   BUDESONIDE-FORMOTEROL (SYMBICORT) 160-4.5 MCG/ACT INHALER    Inhale 2 puffs into the lungs 2 (two) times daily.   CARBIDOPA-LEVODOPA (SINEMET IR) 25-100 MG TABLET    Take 1 tablet by mouth 2 (two) times daily.   CHOLECALCIFEROL (VITAMIN D PO)    Take 1 tablet by mouth daily.    HYDROCHLOROTHIAZIDE (HYDRODIURIL) 12.5 MG TABLET    Take 1 tablet (12.5 mg total) by mouth daily.   METOPROLOL (LOPRESSOR) 50 MG TABLET    Take 2 tablets (100 mg total) by mouth 2 (two) times daily.   PRIMIDONE (MYSOLINE) 250 MG TABLET    Take 1.5 tablets (375 mg total) by mouth 1 day or 1 dose.   TRAMADOL (ULTRAM) 50 MG TABLET    Take 1 tablet (50 mg total) by mouth every 8 (eight) hours as needed for severe pain.  Modified Medications   No medications on file  Discontinued Medications     SIGNIFICANT DIAGNOSTIC EXAMS  LABS REVIEWED:   09-15-15: wbc 6.7; hgb 13.7; hct 41.9; plt 170; glucose 93; bun 20; creat 0.74; k+ 3.4; na++140  Review of Systems  Constitutional: Negative for malaise/fatigue.  Respiratory: Negative for cough and shortness of breath.   Cardiovascular: Negative for chest pain, palpitations and leg swelling.  Gastrointestinal: Negative for heartburn, abdominal pain and constipation.  Musculoskeletal: Negative for myalgias and joint pain.  Skin: Negative.   Psychiatric/Behavioral: The patient is not nervous/anxious.     Physical Exam  Constitutional: She is oriented to person, place, and time. No distress.  Eyes: Conjunctivae are normal.  Neck: Neck supple. No JVD present. No thyromegaly present.  Cardiovascular: Normal rate, regular rhythm and intact distal pulses.   Respiratory: Effort normal and breath sounds normal. No respiratory distress. She has no wheezes.  GI: Soft. Bowel sounds are normal. She exhibits no distension. There is no tenderness.  Musculoskeletal: She exhibits no edema.  Able to move all extremities     Lymphadenopathy:    She has no cervical adenopathy.  Neurological: She is alert and oriented to person, place, and time.  Skin: Skin is warm and dry. She is not diaphoretic.  Psychiatric: She has a normal mood and affect.       ASSESSMENT/ PLAN:  Will discharge to home with home health for pt/rn/ot/st/cna: to evaluate and treat as indicated for gait; balance; strength; adl training; dysphagia  medication management; adl care. She will need a standard wheelchair with elevating leg rests and anti-roll back brakes  in order for her to maintain her current level of independence with her adls which cannot be achieved with a walker; she will need a wheelchair cushion as well. She can self propel. She will need a 3:1 commode. Her prescriptions have been written for a 30 day supply of her medications with #30 ultram 50 mg tabs. She will follow up with Dr. Ellard Artis the facility is to setup the appointment prior to her discharge from this facility   Time spent with patient 45   minutes >50% time spent counseling; reviewing medical record; tests; labs; and developing future plan of care   Ok Edwards NP [facility]  Contact [phone] Monday through Friday 8am- 5pm  After hours call [phone]

## 2015-10-13 ENCOUNTER — Ambulatory Visit: Payer: Medicare Other | Admitting: Family Medicine

## 2015-10-13 ENCOUNTER — Other Ambulatory Visit: Payer: Self-pay | Admitting: Family Medicine

## 2015-10-15 DIAGNOSIS — M6281 Muscle weakness (generalized): Secondary | ICD-10-CM | POA: Diagnosis not present

## 2015-10-15 DIAGNOSIS — R296 Repeated falls: Secondary | ICD-10-CM | POA: Diagnosis not present

## 2015-10-15 DIAGNOSIS — G250 Essential tremor: Secondary | ICD-10-CM | POA: Diagnosis not present

## 2015-10-15 DIAGNOSIS — R2681 Unsteadiness on feet: Secondary | ICD-10-CM | POA: Diagnosis not present

## 2015-10-16 DIAGNOSIS — G250 Essential tremor: Secondary | ICD-10-CM | POA: Diagnosis not present

## 2015-10-16 DIAGNOSIS — R2681 Unsteadiness on feet: Secondary | ICD-10-CM | POA: Diagnosis not present

## 2015-10-16 DIAGNOSIS — M6281 Muscle weakness (generalized): Secondary | ICD-10-CM | POA: Diagnosis not present

## 2015-10-16 DIAGNOSIS — R296 Repeated falls: Secondary | ICD-10-CM | POA: Diagnosis not present

## 2015-10-17 DIAGNOSIS — R296 Repeated falls: Secondary | ICD-10-CM | POA: Diagnosis not present

## 2015-10-17 DIAGNOSIS — R2681 Unsteadiness on feet: Secondary | ICD-10-CM | POA: Diagnosis not present

## 2015-10-17 DIAGNOSIS — M6281 Muscle weakness (generalized): Secondary | ICD-10-CM | POA: Diagnosis not present

## 2015-10-17 DIAGNOSIS — G250 Essential tremor: Secondary | ICD-10-CM | POA: Diagnosis not present

## 2015-10-18 ENCOUNTER — Telehealth: Payer: Self-pay

## 2015-10-18 DIAGNOSIS — M6281 Muscle weakness (generalized): Secondary | ICD-10-CM | POA: Diagnosis not present

## 2015-10-18 DIAGNOSIS — I351 Nonrheumatic aortic (valve) insufficiency: Secondary | ICD-10-CM | POA: Insufficient documentation

## 2015-10-18 DIAGNOSIS — R2681 Unsteadiness on feet: Secondary | ICD-10-CM | POA: Diagnosis not present

## 2015-10-18 DIAGNOSIS — R296 Repeated falls: Secondary | ICD-10-CM | POA: Diagnosis not present

## 2015-10-18 DIAGNOSIS — G250 Essential tremor: Secondary | ICD-10-CM | POA: Diagnosis not present

## 2015-10-18 DIAGNOSIS — Z9689 Presence of other specified functional implants: Secondary | ICD-10-CM | POA: Diagnosis not present

## 2015-10-18 MED ORDER — TERCONAZOLE 0.8 % VA CREA: 1.0000 | TOPICAL_CREAM | Freq: Every day | VAGINAL | Status: DC

## 2015-10-18 NOTE — Telephone Encounter (Signed)
Rx sent to pharmacy. Pt should return to clinic for nursing home follow up next week or so.

## 2015-10-18 NOTE — Telephone Encounter (Signed)
Patient would like a refill on terconazole cream. Not on current medication list. She has vaginal irritation. Denies itching. Please advise.

## 2015-10-18 NOTE — Telephone Encounter (Signed)
Notified patient that medication has been sent to the pharmacy, and needs a follow-up in a week or so.

## 2015-10-19 DIAGNOSIS — G250 Essential tremor: Secondary | ICD-10-CM | POA: Diagnosis not present

## 2015-10-19 DIAGNOSIS — R296 Repeated falls: Secondary | ICD-10-CM | POA: Diagnosis not present

## 2015-10-19 DIAGNOSIS — M6281 Muscle weakness (generalized): Secondary | ICD-10-CM | POA: Diagnosis not present

## 2015-10-19 DIAGNOSIS — R2681 Unsteadiness on feet: Secondary | ICD-10-CM | POA: Diagnosis not present

## 2015-10-20 ENCOUNTER — Telehealth: Payer: Self-pay | Admitting: Emergency Medicine

## 2015-10-20 DIAGNOSIS — G250 Essential tremor: Secondary | ICD-10-CM | POA: Diagnosis not present

## 2015-10-20 DIAGNOSIS — M6281 Muscle weakness (generalized): Secondary | ICD-10-CM | POA: Diagnosis not present

## 2015-10-20 DIAGNOSIS — R2681 Unsteadiness on feet: Secondary | ICD-10-CM | POA: Diagnosis not present

## 2015-10-20 DIAGNOSIS — R296 Repeated falls: Secondary | ICD-10-CM | POA: Diagnosis not present

## 2015-10-23 DIAGNOSIS — G250 Essential tremor: Secondary | ICD-10-CM | POA: Diagnosis not present

## 2015-10-23 DIAGNOSIS — M6281 Muscle weakness (generalized): Secondary | ICD-10-CM | POA: Diagnosis not present

## 2015-10-23 DIAGNOSIS — R2681 Unsteadiness on feet: Secondary | ICD-10-CM | POA: Diagnosis not present

## 2015-10-23 DIAGNOSIS — R296 Repeated falls: Secondary | ICD-10-CM | POA: Diagnosis not present

## 2015-10-25 DIAGNOSIS — R296 Repeated falls: Secondary | ICD-10-CM | POA: Diagnosis not present

## 2015-10-25 DIAGNOSIS — R2681 Unsteadiness on feet: Secondary | ICD-10-CM | POA: Diagnosis not present

## 2015-10-25 DIAGNOSIS — G250 Essential tremor: Secondary | ICD-10-CM | POA: Diagnosis not present

## 2015-10-25 DIAGNOSIS — M6281 Muscle weakness (generalized): Secondary | ICD-10-CM | POA: Diagnosis not present

## 2015-10-26 DIAGNOSIS — Z91041 Radiographic dye allergy status: Secondary | ICD-10-CM | POA: Diagnosis not present

## 2015-10-26 DIAGNOSIS — Z923 Personal history of irradiation: Secondary | ICD-10-CM | POA: Diagnosis not present

## 2015-10-26 DIAGNOSIS — Z91013 Allergy to seafood: Secondary | ICD-10-CM | POA: Diagnosis not present

## 2015-10-26 DIAGNOSIS — Z9221 Personal history of antineoplastic chemotherapy: Secondary | ICD-10-CM | POA: Diagnosis not present

## 2015-10-26 DIAGNOSIS — Z885 Allergy status to narcotic agent status: Secondary | ICD-10-CM | POA: Diagnosis not present

## 2015-10-26 DIAGNOSIS — Z79899 Other long term (current) drug therapy: Secondary | ICD-10-CM | POA: Diagnosis not present

## 2015-10-26 DIAGNOSIS — G250 Essential tremor: Secondary | ICD-10-CM | POA: Diagnosis not present

## 2015-10-26 DIAGNOSIS — Z85118 Personal history of other malignant neoplasm of bronchus and lung: Secondary | ICD-10-CM | POA: Diagnosis not present

## 2015-10-26 DIAGNOSIS — Z4542 Encounter for adjustment and management of neuropacemaker (brain) (peripheral nerve) (spinal cord): Secondary | ICD-10-CM | POA: Diagnosis not present

## 2015-10-26 DIAGNOSIS — Z91010 Allergy to peanuts: Secondary | ICD-10-CM | POA: Diagnosis not present

## 2015-10-26 DIAGNOSIS — J449 Chronic obstructive pulmonary disease, unspecified: Secondary | ICD-10-CM | POA: Diagnosis not present

## 2015-10-26 DIAGNOSIS — Z87891 Personal history of nicotine dependence: Secondary | ICD-10-CM | POA: Diagnosis not present

## 2015-10-26 DIAGNOSIS — Z888 Allergy status to other drugs, medicaments and biological substances status: Secondary | ICD-10-CM | POA: Diagnosis not present

## 2015-10-30 DIAGNOSIS — M6281 Muscle weakness (generalized): Secondary | ICD-10-CM | POA: Diagnosis not present

## 2015-10-30 DIAGNOSIS — R2681 Unsteadiness on feet: Secondary | ICD-10-CM | POA: Diagnosis not present

## 2015-10-30 DIAGNOSIS — G250 Essential tremor: Secondary | ICD-10-CM | POA: Diagnosis not present

## 2015-10-30 DIAGNOSIS — R296 Repeated falls: Secondary | ICD-10-CM | POA: Diagnosis not present

## 2015-10-31 DIAGNOSIS — R2681 Unsteadiness on feet: Secondary | ICD-10-CM | POA: Diagnosis not present

## 2015-10-31 DIAGNOSIS — G250 Essential tremor: Secondary | ICD-10-CM | POA: Diagnosis not present

## 2015-10-31 DIAGNOSIS — M6281 Muscle weakness (generalized): Secondary | ICD-10-CM | POA: Diagnosis not present

## 2015-10-31 DIAGNOSIS — R296 Repeated falls: Secondary | ICD-10-CM | POA: Diagnosis not present

## 2015-11-01 DIAGNOSIS — M6281 Muscle weakness (generalized): Secondary | ICD-10-CM | POA: Diagnosis not present

## 2015-11-01 DIAGNOSIS — G250 Essential tremor: Secondary | ICD-10-CM | POA: Diagnosis not present

## 2015-11-01 DIAGNOSIS — R2681 Unsteadiness on feet: Secondary | ICD-10-CM | POA: Diagnosis not present

## 2015-11-01 DIAGNOSIS — R296 Repeated falls: Secondary | ICD-10-CM | POA: Diagnosis not present

## 2015-11-03 DIAGNOSIS — R296 Repeated falls: Secondary | ICD-10-CM | POA: Diagnosis not present

## 2015-11-03 DIAGNOSIS — G250 Essential tremor: Secondary | ICD-10-CM | POA: Diagnosis not present

## 2015-11-03 DIAGNOSIS — M6281 Muscle weakness (generalized): Secondary | ICD-10-CM | POA: Diagnosis not present

## 2015-11-03 DIAGNOSIS — R2681 Unsteadiness on feet: Secondary | ICD-10-CM | POA: Diagnosis not present

## 2015-11-06 DIAGNOSIS — R2681 Unsteadiness on feet: Secondary | ICD-10-CM | POA: Diagnosis not present

## 2015-11-06 DIAGNOSIS — M6281 Muscle weakness (generalized): Secondary | ICD-10-CM | POA: Diagnosis not present

## 2015-11-06 DIAGNOSIS — G250 Essential tremor: Secondary | ICD-10-CM | POA: Diagnosis not present

## 2015-11-06 DIAGNOSIS — R296 Repeated falls: Secondary | ICD-10-CM | POA: Diagnosis not present

## 2015-11-07 ENCOUNTER — Ambulatory Visit (INDEPENDENT_AMBULATORY_CARE_PROVIDER_SITE_OTHER): Payer: Medicare Other | Admitting: Family Medicine

## 2015-11-07 ENCOUNTER — Encounter: Payer: Self-pay | Admitting: Family Medicine

## 2015-11-07 VITALS — BP 134/58 | HR 66 | Wt 115.0 lb

## 2015-11-07 DIAGNOSIS — R296 Repeated falls: Secondary | ICD-10-CM

## 2015-11-07 DIAGNOSIS — J4489 Other specified chronic obstructive pulmonary disease: Secondary | ICD-10-CM

## 2015-11-07 DIAGNOSIS — I10 Essential (primary) hypertension: Secondary | ICD-10-CM | POA: Diagnosis not present

## 2015-11-07 DIAGNOSIS — M545 Low back pain: Secondary | ICD-10-CM | POA: Diagnosis not present

## 2015-11-07 DIAGNOSIS — G250 Essential tremor: Secondary | ICD-10-CM | POA: Diagnosis not present

## 2015-11-07 DIAGNOSIS — Z9181 History of falling: Secondary | ICD-10-CM | POA: Diagnosis not present

## 2015-11-07 DIAGNOSIS — Z9689 Presence of other specified functional implants: Secondary | ICD-10-CM | POA: Diagnosis not present

## 2015-11-07 DIAGNOSIS — R000 Tachycardia, unspecified: Secondary | ICD-10-CM | POA: Diagnosis not present

## 2015-11-07 DIAGNOSIS — Z79891 Long term (current) use of opiate analgesic: Secondary | ICD-10-CM | POA: Diagnosis not present

## 2015-11-07 DIAGNOSIS — J449 Chronic obstructive pulmonary disease, unspecified: Secondary | ICD-10-CM

## 2015-11-07 DIAGNOSIS — Z87440 Personal history of urinary (tract) infections: Secondary | ICD-10-CM | POA: Diagnosis not present

## 2015-11-07 DIAGNOSIS — R2681 Unsteadiness on feet: Secondary | ICD-10-CM | POA: Diagnosis not present

## 2015-11-07 DIAGNOSIS — M6281 Muscle weakness (generalized): Secondary | ICD-10-CM | POA: Diagnosis not present

## 2015-11-07 MED ORDER — BUDESONIDE-FORMOTEROL FUMARATE 160-4.5 MCG/ACT IN AERO: 2.0000 | INHALATION_SPRAY | Freq: Two times a day (BID) | RESPIRATORY_TRACT | Status: AC

## 2015-11-07 NOTE — Assessment & Plan Note (Signed)
Doing reasonably well. Patient is happy with her current situation. Continue current regimen. Recheck in a few weeks.

## 2015-11-07 NOTE — Progress Notes (Signed)
Sherri Singh is a 75 y.o. female who presents to [facility] today for follow-up recent nursing home admission. Patient was recently admitted to a skilled nursing facility for rehabilitation. She's been discharged now for a few weeks. This is her first visit following discharge. She feels well. She is living independently again with the help of home physical therapy and home nursing aides. She notes that she recently had a brain stimulator battery change. This is about 2 weeks ago. She feels well with no other complaints. She notes she needs a refill on her Symbicort.   Past Medical History  Diagnosis Date  . Lung cancer ([place])     lung ca dx 04/2009  . Benign essential tremor   . Lung cancer ([place])   . COPD (chronic obstructive pulmonary disease) [facility])    Past Surgical History  Procedure Laterality Date  . Deep brain stimulator placement    . Mouth surgery    . Breast surgery    . Appendectomy    . Ovary surgery     Social History  Substance Use Topics  . Smoking status: Former Smoker -- 67 years    Quit date: 09/23/1997  . Smokeless tobacco: Not on file  . Alcohol Use: No   family history includes Cancer in her mother; Diabetes in her mother; Heart attack in her mother.  ROS as above Medications: Current Outpatient Prescriptions  Medication Sig Dispense Refill  . AMBULATORY NON FORMULARY MEDICATION Compression stockings bilateral knee highs  for bilateral leg/ankle edema 2 Device 0  . BIOTIN 5000 PO Take by mouth daily.    . budesonide-formoterol (SYMBICORT) 160-4.5 MCG/ACT inhaler Inhale 2 puffs into the lungs 2 (two) times daily. 3 Inhaler 12  . Cholecalciferol (VITAMIN D PO) Take 1 tablet by mouth daily.     . metoprolol (LOPRESSOR) 50 MG tablet Take 2 tablets (100 mg total) by mouth 2 (two) times daily. 120 tablet 2  . primidone (MYSOLINE) 250 MG tablet Take 1.5  tablets (375 mg total) by mouth 1 day or 1 dose. 45 tablet 5   No current facility-administered medications for this visit.   Allergies  Allergen Reactions  . Codeine Other (See Comments)  . Other Other (See Comments)    Peanuts, Causes headaches Allergic to a type of pain medication, unable to recall name of medication at this time  . Budesonide-Formoterol Fumarate     REACTION: hoarseness  . Iohexol      Code: HIVES, Desc: pt developed hives 30+ yrs ago; needs 13 hour prep in future per Dr Leonia Reeves; 06/26/09 slg   . Morphine Other (See Comments)    Unsure of Reaction  . Shellfish-Derived Products Other (See Comments)    Positive allergy test- has never had a reaction  . Iodine Rash    Boils on skin     Exam:  BP 134/58 mmHg  Pulse 66  Wt 115 lb (52.164 kg)  SpO2 98% Gen: Well NAD HEENT: EOMI,  MMM Lungs: Normal work of breathing. CTABL Heart: RRR no MRG Abd: NABS, Soft. Nondistended, Nontender Exts: Brisk capillary refill, warm and well perfused.  Chest wall: Well-appearing incision of the right chest wall brain stimulator. Staples in place. Wound is well with no erythema or induration. Neuro: Very slow speech. Poor facial movement. Resting tremor present. Patient moves very slowly. Exam findings are symmetrical   No results found for this or any previous visit (from the past  24 hour(s)). No results found.   Please see individual assessment and plan sections.

## 2015-11-07 NOTE — Patient Instructions (Signed)
Thank you for coming in today. Return in 2-4 weeks.  We will try to get reccords.  Return sooner if needed.   Stitches, Staples, or Adhesive Wound Closure Health care providers use stitches (sutures), staples, and certain glue (skin adhesives) to hold skin together while it heals (wound closure). You may need this treatment after you have surgery or if you cut your skin accidentally. These methods help your skin to heal more quickly and make it less likely that you will have a scar. A wound may take several months to heal completely. The type of wound you have determines when your wound gets closed. In most cases, the wound is closed as soon as possible (primary skin closure). Sometimes, closure is delayed so the wound can be cleaned and allowed to heal naturally. This reduces the chance of infection. Delayed closure may be needed if your wound:  Is caused by a bite.  Happened more than 6 hours ago.  Involves loss of skin or the tissues under the skin.  Has dirt or debris in it that cannot be removed.  Is infected. WHAT ARE THE DIFFERENT KINDS OF WOUND CLOSURES? There are many options for wound closure. The one that your health care provider uses depends on how deep and how large your wound is. Adhesive Glue To use this type of glue to close a wound, your health care provider holds the edges of the wound together and paints the glue on the surface of your skin. You may need more than one layer of glue. Then the wound may be covered with a light bandage (dressing). This type of skin closure may be used for small wounds that are not deep (superficial). Using glue for wound closure is less painful than other methods. It does not require a medicine that numbs the area (local anesthetic). This method also leaves nothing to be removed. Adhesive glue is often used for children and on facial wounds. Adhesive glue cannot be used for wounds that are deep, uneven, or bleeding. It is not used inside of a  wound.  Adhesive Strips These strips are made of sticky (adhesive), porous paper. They are applied across your skin edges like a regular adhesive bandage. You leave them on until they fall off. Adhesive strips may be used to close very superficial wounds. They may also be used along with sutures to improve the closure of your skin edges.  Sutures Sutures are the oldest method of wound closure. Sutures can be made from natural substances, such as silk, or from synthetic materials, such as nylon and steel. They can be made from a material that your body can break down as your wound heals (absorbable), or they can be made from a material that needs to be removed from your skin (nonabsorbable). They come in many different strengths and sizes. Your health care provider attaches the sutures to a steel needle on one end. Sutures can be passed through your skin, or through the tissues beneath your skin. Then they are tied and cut. Your skin edges may be closed in one continuous stitch or in separate stitches. Sutures are strong and can be used for all kinds of wounds. Absorbable sutures may be used to close tissues under the skin. The disadvantage of sutures is that they may cause skin reactions that lead to infection. Nonabsorbable sutures need to be removed. Staples When surgical staples are used to close a wound, the edges of your skin on both sides of the wound are brought  close together. A staple is placed across the wound, and an instrument secures the edges together. Staples are often used to close surgical cuts (incisions). Staples are faster to use than sutures, and they cause less skin reaction. Staples need to be removed using a tool that bends the staples away from your skin. HOW DO I CARE FOR MY WOUND CLOSURE?  Take medicines only as directed by your health care provider.  If you were prescribed an antibiotic medicine for your wound, finish it all even if you start to feel better.  Use  ointments or creams only as directed by your health care provider.  Wash your hands with soap and water before and after touching your wound.  Do not soak your wound in water. Do not take baths, swim, or use a hot tub until your health care provider approves.  Ask your health care provider when you can start showering. Cover your wound if directed by your health care provider.  Do not take out your own sutures or staples.  Do not pick at your wound. Picking can cause an infection.  Keep all follow-up visits as directed by your health care provider. This is important. HOW LONG WILL I HAVE MY WOUND CLOSURE?  Leave adhesive glue on your skin until the glue peels away.  Leave adhesive strips on your skin until the strips fall off.  Absorbable sutures will dissolve within several days.  Nonabsorbable sutures and staples must be removed. The location of the wound will determine how long they stay in. This can range from several days to a couple of weeks. WHEN SHOULD I SEEK HELP FOR MY WOUND CLOSURE? Contact your health care provider if:  You have a fever.  You have chills.  You have drainage, redness, swelling, or pain at your wound.  There is a bad smell coming from your wound.  The skin edges of your wound start to separate after your sutures have been removed.  Your wound becomes thick, raised, and darker in color after your sutures come out (scarring).   This information is not intended to replace advice given to you by your health care provider. Make sure you discuss any questions you have with your health care provider.   Document Released: 06/04/2001 Document Revised: 09/30/2014 Document Reviewed: 02/16/2014 Elsevier Interactive Patient Education [employer].

## 2015-11-07 NOTE — Assessment & Plan Note (Signed)
Refill Symbicort

## 2015-11-07 NOTE — Assessment & Plan Note (Signed)
Patient still at significant risk for falls. There is not much of an option at this point. She's receiving home therapy and is not interested in a skilled nursing facility or assisted living facility. She's receiving maximal home aidsinue to watch closely. Encourage family involvement.

## 2015-11-08 DIAGNOSIS — R296 Repeated falls: Secondary | ICD-10-CM | POA: Diagnosis not present

## 2015-11-08 DIAGNOSIS — M6281 Muscle weakness (generalized): Secondary | ICD-10-CM | POA: Diagnosis not present

## 2015-11-08 DIAGNOSIS — G250 Essential tremor: Secondary | ICD-10-CM | POA: Diagnosis not present

## 2015-11-08 DIAGNOSIS — R2681 Unsteadiness on feet: Secondary | ICD-10-CM | POA: Diagnosis not present

## 2015-11-09 DIAGNOSIS — G250 Essential tremor: Secondary | ICD-10-CM | POA: Diagnosis not present

## 2015-11-09 DIAGNOSIS — R2681 Unsteadiness on feet: Secondary | ICD-10-CM | POA: Diagnosis not present

## 2015-11-09 DIAGNOSIS — M6281 Muscle weakness (generalized): Secondary | ICD-10-CM | POA: Diagnosis not present

## 2015-11-09 DIAGNOSIS — R296 Repeated falls: Secondary | ICD-10-CM | POA: Diagnosis not present

## 2015-11-10 DIAGNOSIS — R296 Repeated falls: Secondary | ICD-10-CM | POA: Diagnosis not present

## 2015-11-10 DIAGNOSIS — R2681 Unsteadiness on feet: Secondary | ICD-10-CM | POA: Diagnosis not present

## 2015-11-10 DIAGNOSIS — G250 Essential tremor: Secondary | ICD-10-CM | POA: Diagnosis not present

## 2015-11-10 DIAGNOSIS — M6281 Muscle weakness (generalized): Secondary | ICD-10-CM | POA: Diagnosis not present

## 2015-11-13 DIAGNOSIS — R2681 Unsteadiness on feet: Secondary | ICD-10-CM | POA: Diagnosis not present

## 2015-11-13 DIAGNOSIS — G250 Essential tremor: Secondary | ICD-10-CM | POA: Diagnosis not present

## 2015-11-13 DIAGNOSIS — M6281 Muscle weakness (generalized): Secondary | ICD-10-CM | POA: Diagnosis not present

## 2015-11-13 DIAGNOSIS — R296 Repeated falls: Secondary | ICD-10-CM | POA: Diagnosis not present

## 2015-11-14 ENCOUNTER — Ambulatory Visit (INDEPENDENT_AMBULATORY_CARE_PROVIDER_SITE_OTHER): Payer: Medicare Other | Admitting: Family Medicine

## 2015-11-14 VITALS — BP 121/58 | HR 84 | Wt 115.0 lb

## 2015-11-14 DIAGNOSIS — I351 Nonrheumatic aortic (valve) insufficiency: Secondary | ICD-10-CM | POA: Diagnosis not present

## 2015-11-14 DIAGNOSIS — R296 Repeated falls: Secondary | ICD-10-CM

## 2015-11-14 DIAGNOSIS — R2681 Unsteadiness on feet: Secondary | ICD-10-CM | POA: Diagnosis not present

## 2015-11-14 DIAGNOSIS — J449 Chronic obstructive pulmonary disease, unspecified: Secondary | ICD-10-CM

## 2015-11-14 DIAGNOSIS — G250 Essential tremor: Secondary | ICD-10-CM | POA: Diagnosis not present

## 2015-11-14 DIAGNOSIS — M6281 Muscle weakness (generalized): Secondary | ICD-10-CM | POA: Diagnosis not present

## 2015-11-14 MED ORDER — ZOSTER VACCINE LIVE 19400 UNT/0.65ML ~~LOC~~ SOLR: 0.6500 mL | Freq: Once | SUBCUTANEOUS | Status: DC

## 2015-11-14 NOTE — Progress Notes (Signed)
Sherri Singh is a 75 y.o. female who presents to [facility] today for follow-up. Patient was here last week for staple removal following her implanted brain stimulator battery change. She's here week early on accident. She feels well and feels that things are going reasonably well. She continues to receive home health physical therapy. Overall she is quite pleased with things are going. She denies any chest pain palpitations or shortness of breath. She is interested in a varicella zoster vaccination. She declines DEXA scan, mammograms. She notes her influenza vaccine was done during her recent hospitalization.   Past Medical History  Diagnosis Date  . Lung cancer ([place])     lung ca dx 04/2009  . Benign essential tremor   . Lung cancer ([place])   . COPD (chronic obstructive pulmonary disease) [facility])    Past Surgical History  Procedure Laterality Date  . Deep brain stimulator placement    . Mouth surgery    . Breast surgery    . Appendectomy    . Ovary surgery     Social History  Substance Use Topics  . Smoking status: Former Smoker -- 24 years    Quit date: 09/23/1997  . Smokeless tobacco: Not on file  . Alcohol Use: No   family history includes Cancer in her mother; Diabetes in her mother; Heart attack in her mother.  ROS as above Medications: Current Outpatient Prescriptions  Medication Sig Dispense Refill  . AMBULATORY NON FORMULARY MEDICATION Compression stockings bilateral knee highs  for bilateral leg/ankle edema 2 Device 0  . BIOTIN 5000 PO Take by mouth daily.    . budesonide-formoterol (SYMBICORT) 160-4.5 MCG/ACT inhaler Inhale 2 puffs into the lungs 2 (two) times daily. 3 Inhaler 12  . carbidopa-levodopa (SINEMET IR) 25-100 MG tablet     . Cholecalciferol (VITAMIN D PO) Take 1 tablet by mouth daily.     . hydrochlorothiazide (HYDRODIURIL) 12.5 MG tablet     .  metoprolol (LOPRESSOR) 50 MG tablet Take 2 tablets (100 mg total) by mouth 2 (two) times daily. 120 tablet 2  . naproxen (NAPROSYN) 250 MG tablet     . primidone (MYSOLINE) 250 MG tablet Take 1.5 tablets (375 mg total) by mouth 1 day or 1 dose. 45 tablet 5  . traMADol (ULTRAM) 50 MG tablet     . zoster vaccine live, PF, (ZOSTAVAX) 08144 UNT/0.65ML injection Inject 19,400 Units into the skin once. If given in pharmacy fax report to Dr Georgina Snell [phone] 1 each 0   No current facility-administered medications for this visit.   Allergies  Allergen Reactions  . Codeine Other (See Comments)  . Other Other (See Comments)    Peanuts, Causes headaches Allergic to a type of pain medication, unable to recall name of medication at this time  . Budesonide-Formoterol Fumarate     REACTION: hoarseness  . Iohexol      Code: HIVES, Desc: pt developed hives 30+ yrs ago; needs 13 hour prep in future per Dr Leonia Reeves; 06/26/09 slg   . Morphine Other (See Comments)    Unsure of Reaction  . Shellfish-Derived Products Other (See Comments)    Positive allergy test- has never had a reaction  . Iodine Rash    Boils on skin     Exam:  BP 121/58 mmHg  Pulse 84  Wt 115 lb (52.164 kg) Gen: Well NAD HEENT: EOMI,  MMM Lungs: Normal work of breathing. CTABL Heart: RRR murmur  present right upper sternal border systolic 3/6 Abd: NABS, Soft. Nondistended, Nontender Exts: Brisk capillary refill, warm and well perfused.  Skin: Incision well-appearing in right chest wall.  Shingles vaccine prescribed.   No results found for this or any previous visit (from the past 24 hour(s)). No results found.   Please see individual assessment and plan sections.

## 2015-11-14 NOTE — Assessment & Plan Note (Signed)
Continue inhalers

## 2015-11-14 NOTE — Assessment & Plan Note (Signed)
Continue home Health physical therapy

## 2015-11-14 NOTE — Patient Instructions (Signed)
Thank you for coming in today. Return in 1 month or sooner if needed.  You should hear from the pharmacy about the shingles vaccine.

## 2015-11-15 DIAGNOSIS — M6281 Muscle weakness (generalized): Secondary | ICD-10-CM | POA: Diagnosis not present

## 2015-11-15 DIAGNOSIS — G250 Essential tremor: Secondary | ICD-10-CM | POA: Diagnosis not present

## 2015-11-15 DIAGNOSIS — R2681 Unsteadiness on feet: Secondary | ICD-10-CM | POA: Diagnosis not present

## 2015-11-15 DIAGNOSIS — R296 Repeated falls: Secondary | ICD-10-CM | POA: Diagnosis not present

## 2015-11-16 DIAGNOSIS — M6281 Muscle weakness (generalized): Secondary | ICD-10-CM | POA: Diagnosis not present

## 2015-11-16 DIAGNOSIS — G250 Essential tremor: Secondary | ICD-10-CM | POA: Diagnosis not present

## 2015-11-16 DIAGNOSIS — R2681 Unsteadiness on feet: Secondary | ICD-10-CM | POA: Diagnosis not present

## 2015-11-16 DIAGNOSIS — R296 Repeated falls: Secondary | ICD-10-CM | POA: Diagnosis not present

## 2015-11-17 DIAGNOSIS — M6281 Muscle weakness (generalized): Secondary | ICD-10-CM | POA: Diagnosis not present

## 2015-11-17 DIAGNOSIS — G250 Essential tremor: Secondary | ICD-10-CM | POA: Diagnosis not present

## 2015-11-17 DIAGNOSIS — R2681 Unsteadiness on feet: Secondary | ICD-10-CM | POA: Diagnosis not present

## 2015-11-17 DIAGNOSIS — R296 Repeated falls: Secondary | ICD-10-CM | POA: Diagnosis not present

## 2015-11-20 DIAGNOSIS — Z79891 Long term (current) use of opiate analgesic: Secondary | ICD-10-CM | POA: Diagnosis not present

## 2015-11-20 DIAGNOSIS — Z9181 History of falling: Secondary | ICD-10-CM | POA: Diagnosis not present

## 2015-11-20 DIAGNOSIS — M6281 Muscle weakness (generalized): Secondary | ICD-10-CM | POA: Diagnosis not present

## 2015-11-20 DIAGNOSIS — R296 Repeated falls: Secondary | ICD-10-CM | POA: Diagnosis not present

## 2015-11-20 DIAGNOSIS — I10 Essential (primary) hypertension: Secondary | ICD-10-CM | POA: Diagnosis not present

## 2015-11-20 DIAGNOSIS — G250 Essential tremor: Secondary | ICD-10-CM | POA: Diagnosis not present

## 2015-11-20 DIAGNOSIS — Z9689 Presence of other specified functional implants: Secondary | ICD-10-CM | POA: Diagnosis not present

## 2015-11-20 DIAGNOSIS — M545 Low back pain: Secondary | ICD-10-CM | POA: Diagnosis not present

## 2015-11-20 DIAGNOSIS — J449 Chronic obstructive pulmonary disease, unspecified: Secondary | ICD-10-CM | POA: Diagnosis not present

## 2015-11-20 DIAGNOSIS — Z87440 Personal history of urinary (tract) infections: Secondary | ICD-10-CM | POA: Diagnosis not present

## 2015-11-20 DIAGNOSIS — R000 Tachycardia, unspecified: Secondary | ICD-10-CM | POA: Diagnosis not present

## 2015-11-20 DIAGNOSIS — R2681 Unsteadiness on feet: Secondary | ICD-10-CM | POA: Diagnosis not present

## 2015-11-21 ENCOUNTER — Ambulatory Visit: Payer: Medicare Other | Admitting: Family Medicine

## 2015-11-23 DIAGNOSIS — Z79891 Long term (current) use of opiate analgesic: Secondary | ICD-10-CM | POA: Diagnosis not present

## 2015-11-23 DIAGNOSIS — M545 Low back pain: Secondary | ICD-10-CM | POA: Diagnosis not present

## 2015-11-23 DIAGNOSIS — Z87440 Personal history of urinary (tract) infections: Secondary | ICD-10-CM | POA: Diagnosis not present

## 2015-11-23 DIAGNOSIS — J449 Chronic obstructive pulmonary disease, unspecified: Secondary | ICD-10-CM | POA: Diagnosis not present

## 2015-11-23 DIAGNOSIS — R2681 Unsteadiness on feet: Secondary | ICD-10-CM | POA: Diagnosis not present

## 2015-11-23 DIAGNOSIS — I10 Essential (primary) hypertension: Secondary | ICD-10-CM | POA: Diagnosis not present

## 2015-11-23 DIAGNOSIS — Z9689 Presence of other specified functional implants: Secondary | ICD-10-CM | POA: Diagnosis not present

## 2015-11-23 DIAGNOSIS — M6281 Muscle weakness (generalized): Secondary | ICD-10-CM | POA: Diagnosis not present

## 2015-11-23 DIAGNOSIS — G250 Essential tremor: Secondary | ICD-10-CM | POA: Diagnosis not present

## 2015-11-23 DIAGNOSIS — R296 Repeated falls: Secondary | ICD-10-CM | POA: Diagnosis not present

## 2015-11-23 DIAGNOSIS — Z9181 History of falling: Secondary | ICD-10-CM | POA: Diagnosis not present

## 2015-11-23 DIAGNOSIS — R000 Tachycardia, unspecified: Secondary | ICD-10-CM | POA: Diagnosis not present

## 2015-12-12 ENCOUNTER — Ambulatory Visit (INDEPENDENT_AMBULATORY_CARE_PROVIDER_SITE_OTHER): Payer: Medicare Other | Admitting: Family Medicine

## 2015-12-12 ENCOUNTER — Encounter: Payer: Self-pay | Admitting: Family Medicine

## 2015-12-12 VITALS — BP 134/52 | HR 72 | Wt 112.0 lb

## 2015-12-12 DIAGNOSIS — R296 Repeated falls: Secondary | ICD-10-CM | POA: Diagnosis not present

## 2015-12-12 NOTE — Assessment & Plan Note (Signed)
Doing well. Recheck in 2-3 months.

## 2015-12-12 NOTE — Progress Notes (Signed)
Sherri Singh is a 75 y.o. female who presents to [facility] today for follow-up. Patient presents to clinic today to follow-up her recent history of falls. She's had a convalescence daily nursing home several months ago. She's been improving with home physical therapy since. She has improved enough that she no longer was receiving home health physical therapy. She feels much better. She is able to ambulate with a cane now not a walker. She eats and drinks well..   Past Medical History  Diagnosis Date  . Lung cancer ([place])     lung ca dx 04/2009  . Benign essential tremor   . Lung cancer ([place])   . COPD (chronic obstructive pulmonary disease) [facility])    Past Surgical History  Procedure Laterality Date  . Deep brain stimulator placement    . Mouth surgery    . Breast surgery    . Appendectomy    . Ovary surgery     Social History  Substance Use Topics  . Smoking status: Former Smoker -- 77 years    Quit date: 09/23/1997  . Smokeless tobacco: Not on file  . Alcohol Use: No   family history includes Cancer in her mother; Diabetes in her mother; Heart attack in her mother.  ROS as above Medications: Current Outpatient Prescriptions  Medication Sig Dispense Refill  . AMBULATORY NON FORMULARY MEDICATION Compression stockings bilateral knee highs  for bilateral leg/ankle edema 2 Device 0  . BIOTIN 5000 PO Take by mouth daily.    . budesonide-formoterol (SYMBICORT) 160-4.5 MCG/ACT inhaler Inhale 2 puffs into the lungs 2 (two) times daily. 3 Inhaler 12  . carbidopa-levodopa (SINEMET IR) 25-100 MG tablet     . Cholecalciferol (VITAMIN D PO) Take 1 tablet by mouth daily.     . hydrochlorothiazide (HYDRODIURIL) 12.5 MG tablet     . metoprolol (LOPRESSOR) 50 MG tablet Take 2 tablets (100 mg total) by mouth 2 (two) times daily. 120 tablet 2  . naproxen (NAPROSYN) 250 MG tablet     .  primidone (MYSOLINE) 250 MG tablet Take 1.5 tablets (375 mg total) by mouth 1 day or 1 dose. 45 tablet 5  . traMADol (ULTRAM) 50 MG tablet     . zoster vaccine live, PF, (ZOSTAVAX) 36468 UNT/0.65ML injection Inject 19,400 Units into the skin once. If given in pharmacy fax report to Dr Georgina Snell [phone] 1 each 0   No current facility-administered medications for this visit.   Allergies  Allergen Reactions  . Codeine Other (See Comments)  . Other Other (See Comments)    Peanuts, Causes headaches Allergic to a type of pain medication, unable to recall name of medication at this time  . Budesonide-Formoterol Fumarate     REACTION: hoarseness  . Iohexol      Code: HIVES, Desc: pt developed hives 30+ yrs ago; needs 13 hour prep in future per Dr Leonia Reeves; 06/26/09 slg   . Morphine Other (See Comments)    Unsure of Reaction  . Shellfish-Derived Products Other (See Comments)    Positive allergy test- has never had a reaction  . Iodine Rash    Boils on skin     Exam:  BP 134/52 mmHg  Pulse 72  Wt 112 lb (50.803 kg) Gen: Well NAD HEENT: EOMI,  MMM Lungs: Normal work of breathing. CTABL Heart: RRR no RG Abd: NABS, Soft. Nondistended, Nontender Exts: Brisk capillary refill, warm and well perfused.  Neuro: Alert and  oriented slow speech with tremor present bilaterally. Patient is able to stand up from a seated position and walks slowly with a cane. Her gait has improved since last visit.  No results found for this or any previous visit (from the past 24 hour(s)). No results found.   Please see individual assessment and plan sections.

## 2015-12-12 NOTE — Patient Instructions (Signed)
Thank you for coming in today. Return in 2-3 months or sooner if needed.   Fall Prevention in the Home  Falls can cause injuries and can affect people from all age groups. There are many simple things that you can do to make your home safe and to help prevent falls. WHAT CAN I DO ON THE OUTSIDE OF MY HOME?  Regularly repair the edges of walkways and driveways and fix any cracks.  Remove high doorway thresholds.  Trim any shrubbery on the main path into your home.  Use bright outdoor lighting.  Clear walkways of debris and clutter, including tools and rocks.  Regularly check that handrails are securely fastened and in good repair. Both sides of any steps should have handrails.  Install guardrails along the edges of any raised decks or porches.  Have leaves, snow, and ice cleared regularly.  Use sand or salt on walkways during winter months.  In the garage, clean up any spills right away, including grease or oil spills. WHAT CAN I DO IN THE BATHROOM?  Use night lights.  Install grab bars by the toilet and in the tub and shower. Do not use towel bars as grab bars.  Use non-skid mats or decals on the floor of the tub or shower.  If you need to sit down while you are in the shower, use a plastic, non-slip stool.[place]  Keep the floor dry. Immediately clean up any water that spills on the floor.  Remove soap buildup in the tub or shower on a regular basis.  Attach bath mats securely with double-sided non-slip rug tape.  Remove throw rugs and other tripping hazards from the floor. WHAT CAN I DO IN THE BEDROOM?  Use night lights.  Make sure that a bedside light is easy to reach.  Do not use oversized bedding that drapes onto the floor.  Have a firm chair that has side arms to use for getting dressed.  Remove throw rugs and other tripping hazards from the floor. WHAT CAN I DO IN THE KITCHEN?   Clean up any spills right away.  Avoid walking on wet floors.  Place  frequently used items in easy-to-reach places.  If you need to reach for something above you, use a sturdy step stool that has a grab bar.  Keep electrical cables out of the way.  Do not use floor polish or wax that makes floors slippery. If you have to use wax, make sure that it is non-skid floor wax.  Remove throw rugs and other tripping hazards from the floor. WHAT CAN I DO IN THE STAIRWAYS?  Do not leave any items on the stairs.  Make sure that there are handrails on both sides of the stairs. Fix handrails that are broken or loose. Make sure that handrails are as long as the stairways.  Check any carpeting to make sure that it is firmly attached to the stairs. Fix any carpet that is loose or worn.  Avoid having throw rugs at the top or bottom of stairways, or secure the rugs with carpet tape to prevent them from moving.  Make sure that you have a light switch at the top of the stairs and the bottom of the stairs. If you do not have them, have them installed. WHAT ARE SOME OTHER FALL PREVENTION TIPS?  Wear closed-toe shoes that fit well and support your feet. Wear shoes that have rubber soles or low heels.  When you use a stepladder, make sure that it is  completely opened and that the sides are firmly locked. Have someone hold the ladder while you are using it. Do not climb a closed stepladder.  Add color or contrast paint or tape to grab bars and handrails in your home. Place contrasting color strips on the first and last steps.  Use mobility aids as needed, such as canes, walkers, scooters, and crutches.  Turn on lights if it is dark. Replace any light bulbs that burn out.  Set up furniture so that there are clear paths. Keep the furniture in the same spot.  Fix any uneven floor surfaces.  Choose a carpet design that does not hide the edge of steps of a stairway.  Be aware of any and all pets.  Review your medicines with your healthcare provider. Some medicines can cause  dizziness or changes in blood pressure, which increase your risk of falling. Talk with your health care provider about other ways that you can decrease your risk of falls. This may include working with a physical therapist or trainer to improve your strength, balance, and endurance.   This information is not intended to replace advice given to you by your health care provider. Make sure you discuss any questions you have with your health care provider.   Document Released: 08/30/2002 Document Revised: 01/24/2015 Document Reviewed: 10/14/2014 Elsevier Interactive Patient Education [employer].

## 2015-12-14 ENCOUNTER — Other Ambulatory Visit: Payer: Self-pay | Admitting: Family Medicine

## 2016-01-16 ENCOUNTER — Encounter: Payer: Self-pay | Admitting: Family Medicine

## 2016-01-16 ENCOUNTER — Ambulatory Visit (INDEPENDENT_AMBULATORY_CARE_PROVIDER_SITE_OTHER): Payer: Medicare Other | Admitting: Family Medicine

## 2016-01-16 VITALS — BP 157/84 | HR 70 | Wt 110.0 lb

## 2016-01-16 DIAGNOSIS — J449 Chronic obstructive pulmonary disease, unspecified: Secondary | ICD-10-CM

## 2016-01-16 DIAGNOSIS — E46 Unspecified protein-calorie malnutrition: Secondary | ICD-10-CM | POA: Insufficient documentation

## 2016-01-16 MED ORDER — UMECLIDINIUM BROMIDE 62.5 MCG/INH IN AEPB: 1.0000 | INHALATION_SPRAY | Freq: Every day | RESPIRATORY_TRACT | Status: DC

## 2016-01-16 NOTE — Progress Notes (Signed)
Sherri Singh is a 75 y.o. female who presents to [facility] today for follow-up. Patient is doing reasonably well. She denies any recent falls and is living independently happily. She recently had a significant stay in a nursing facility several months ago. She does not however in the springtime her breathing has worsened. She currently is taking her but notes continued some shortness of breath with exertion. She denies any chest pains. No fevers or chills.  Additionally she notes some weight loss recently. This is unintentional. She notes she does not have an appetite at all and that food typically does not taste well. She notes she does not like insurers and is not interested in taking medicines to increase her appetite. When asked she says she does like cashew nuts.   Past Medical History  Diagnosis Date  . Lung cancer ([place])     lung ca dx 04/2009  . Benign essential tremor   . Lung cancer ([place])   . COPD (chronic obstructive pulmonary disease) [facility])    Past Surgical History  Procedure Laterality Date  . Deep brain stimulator placement    . Mouth surgery    . Breast surgery    . Appendectomy    . Ovary surgery     Social History  Substance Use Topics  . Smoking status: Former Smoker -- 25 years    Quit date: 09/23/1997  . Smokeless tobacco: Not on file  . Alcohol Use: No   family history includes Cancer in her mother; Diabetes in her mother; Heart attack in her mother.  ROS as above Medications: Current Outpatient Prescriptions  Medication Sig Dispense Refill  . AMBULATORY NON FORMULARY MEDICATION Compression stockings bilateral knee highs  for bilateral leg/ankle edema 2 Device 0  . budesonide-formoterol (SYMBICORT) 160-4.5 MCG/ACT inhaler Inhale 2 puffs into the lungs 2 (two) times daily. 3 Inhaler 12  . Cholecalciferol (VITAMIN D PO) Take 1 tablet by mouth daily.      . metoprolol (LOPRESSOR) 50 MG tablet TAKE TWO TABLETS BY MOUTH TWICE DAILY 120 tablet 0  . primidone (MYSOLINE) 250 MG tablet Take 1.5 tablets (375 mg total) by mouth 1 day or 1 dose. 45 tablet 5  . umeclidinium bromide (INCRUSE ELLIPTA) 62.5 MCG/INH AEPB Inhale 1 puff into the lungs daily. 30 each 12   No current facility-administered medications for this visit.   Allergies  Allergen Reactions  . Codeine Other (See Comments)  . Other Other (See Comments)    Peanuts, Causes headaches Allergic to a type of pain medication, unable to recall name of medication at this time  . Budesonide-Formoterol Fumarate     REACTION: hoarseness  . Iohexol      Code: HIVES, Desc: pt developed hives 30+ yrs ago; needs 13 hour prep in future per Dr Leonia Reeves; 06/26/09 slg   . Morphine Other (See Comments)    Unsure of Reaction  . Shellfish-Derived Products Other (See Comments)    Positive allergy test- has never had a reaction  . Iodine Rash    Boils on skin     Exam:  BP 157/84 mmHg  Pulse 70  Wt 110 lb (49.896 kg) Gen: Well NAD Nontoxic appearing HEENT: EOMI,  MMM Lungs: Normal work of breathing. Prolonged expiratory phase bilaterally without wheezing. Heart: RRR no MRG Abd: NABS, Soft. Nondistended, Nontender Exts: Brisk capillary refill, warm and well perfused.  Neuro: Slowed speech slow to stand moves all extremities. Mild bilateral  tremor present  No results found for this or any previous visit (from the past 24 hour(s)). No results found.   Please see individual assessment and plan sections.

## 2016-01-16 NOTE — Assessment & Plan Note (Signed)
Patient continues to have shortness of breath with her COPD. I believe the shortness of breath is due to COPD. She already has combination inhaled steroid and long-acting beta agonist therapy. We'll add Anoro to give triple therapy with steroids and LABA/LAMA therapy.  Recheck in 2 months or so.

## 2016-01-16 NOTE — Assessment & Plan Note (Signed)
Weight loss continues.  We are in a bit of a bind here. I recommended she eats a snack of cashew nuts every day. She'll try this and we will recheck in a few months

## 2016-01-16 NOTE — Patient Instructions (Signed)
Thank you for coming in today. Use the other Incruse inhaler.  Return in 2-3 months.  Let me know if this inhaler is too expensive.  Return sooner if needed.  Call or go to the emergency room if you get worse, have trouble breathing, have chest pains, or palpitations.  Add some cashews to your diet.

## 2016-01-22 ENCOUNTER — Other Ambulatory Visit: Payer: Self-pay | Admitting: Family Medicine

## 2016-01-29 ENCOUNTER — Telehealth: Payer: Self-pay

## 2016-01-29 MED ORDER — AMBULATORY NON FORMULARY MEDICATION: 1.0000 | Status: DC

## 2016-01-29 NOTE — Telephone Encounter (Signed)
Pt called requesting an order for a medical walker. She would like a four wheel rollator style walker. Thanks.

## 2016-01-29 NOTE — Telephone Encounter (Signed)
Ordered

## 2016-01-30 NOTE — Telephone Encounter (Signed)
Called pt and notified her that the order has been placed up front for her to pick up. Pt verbalized understanding.

## 2016-02-14 DIAGNOSIS — R269 Unspecified abnormalities of gait and mobility: Secondary | ICD-10-CM | POA: Diagnosis not present

## 2016-02-27 ENCOUNTER — Other Ambulatory Visit: Payer: Self-pay | Admitting: Family Medicine

## 2016-03-13 ENCOUNTER — Ambulatory Visit: Payer: Medicare Other | Admitting: Family Medicine

## 2016-03-18 ENCOUNTER — Encounter: Payer: Self-pay | Admitting: Family Medicine

## 2016-03-18 ENCOUNTER — Ambulatory Visit (INDEPENDENT_AMBULATORY_CARE_PROVIDER_SITE_OTHER): Payer: Medicare Other | Admitting: Family Medicine

## 2016-03-18 VITALS — BP 146/47 | HR 59 | Wt 107.0 lb

## 2016-03-18 DIAGNOSIS — J449 Chronic obstructive pulmonary disease, unspecified: Secondary | ICD-10-CM | POA: Diagnosis not present

## 2016-03-18 DIAGNOSIS — Z780 Asymptomatic menopausal state: Secondary | ICD-10-CM | POA: Diagnosis not present

## 2016-03-18 DIAGNOSIS — Z23 Encounter for immunization: Secondary | ICD-10-CM

## 2016-03-18 DIAGNOSIS — Z2839 Other underimmunization status: Secondary | ICD-10-CM

## 2016-03-18 DIAGNOSIS — Z283 Underimmunization status: Secondary | ICD-10-CM | POA: Diagnosis not present

## 2016-03-18 MED ORDER — ZOSTER VACCINE LIVE 19400 UNT/0.65ML ~~LOC~~ SUSR: 0.6500 mL | Freq: Once | SUBCUTANEOUS | Status: DC

## 2016-03-18 NOTE — Progress Notes (Signed)
Sherri Singh is a 75 y.o. female who presents to [facility] today for COPD. Patient notes continued COPD. She is taking incruse ermittently. She takes Symbicort daily. No significant shortness of breath she feels well. No fevers or chills.  Bone density screening: Patient would like DEXA scan Pneumonia prophylaxis: Patient is due for pneumococcal pneumonia vaccine Zoster vaccine: Patient would like shingles vaccine    Past Medical History  Diagnosis Date  . Lung cancer ([place])     lung ca dx 04/2009  . Benign essential tremor   . Lung cancer ([place])   . COPD (chronic obstructive pulmonary disease) [facility])    Past Surgical History  Procedure Laterality Date  . Deep brain stimulator placement    . Mouth surgery    . Breast surgery    . Appendectomy    . Ovary surgery     Social History  Substance Use Topics  . Smoking status: Former Smoker -- 65 years    Quit date: 09/23/1997  . Smokeless tobacco: Not on file  . Alcohol Use: No   family history includes Cancer in her mother; Diabetes in her mother; Heart attack in her mother.  ROS as above:  Medications: Current Outpatient Prescriptions  Medication Sig Dispense Refill  . AMBULATORY NON FORMULARY MEDICATION Compression stockings bilateral knee highs  for bilateral leg/ankle edema 2 Device 0  . AMBULATORY NON FORMULARY MEDICATION 1 Device by Other route continuous. four wheel rollator style walker Code: G25.0 1 Device 0  . budesonide-formoterol (SYMBICORT) 160-4.5 MCG/ACT inhaler Inhale 2 puffs into the lungs 2 (two) times daily. 3 Inhaler 12  . Cholecalciferol (VITAMIN D PO) Take 1 tablet by mouth daily.     . metoprolol (LOPRESSOR) 50 MG tablet TAKE TWO TABLETS BY MOUTH TWICE DAILY 120 tablet 0  . umeclidinium bromide (INCRUSE ELLIPTA) 62.5 MCG/INH AEPB Inhale 1 puff into the lungs daily. 30 each 12  .  Zoster Vaccine Live, PF, (ZOSTAVAX) 35009 UNT/0.65ML injection Inject 19,400 Units into the skin once. If given in pharmacy fax report to Dr Georgina Snell [phone] 1 each 0   No current facility-administered medications for this visit.   Allergies  Allergen Reactions  . Codeine Other (See Comments)  . Other Other (See Comments)    Peanuts, Causes headaches Allergic to a type of pain medication, unable to recall name of medication at this time  . Budesonide-Formoterol Fumarate     REACTION: hoarseness  . Iohexol      Code: HIVES, Desc: pt developed hives 30+ yrs ago; needs 13 hour prep in future per Dr Leonia Reeves; 06/26/09 slg   . Morphine Other (See Comments)    Unsure of Reaction  . Shellfish-Derived Products Other (See Comments)    Positive allergy test- has never had a reaction  . Iodine Rash    Boils on skin     Exam:  BP 146/47 mmHg  Pulse 59  Wt 107 lb (48.535 kg)  SpO2 95% Gen: Well NAD frail elderly woman HEENT: EOMI,  MMM Lungs: Normal work of breathing. Prolonged for a phase with coarse breath sounds present. Heart: RRR no MRG Abd: NABS, Soft. Nondistended, Nontender Exts: Brisk capillary refill, warm and well perfused.   Pneumovax given prior to discharge  No results found for this or any previous visit (from the past 24 hour(s)). No results found.    Assessment and Plan: 75 y.o. female with COPD: Doing reasonably well. Continue  current regimen. Encouraged patient to take her Incruse more frequently.  Pneumovax given Zoster vaccine ordered DEXA scan ordered   Discussed warning signs or symptoms. Please see discharge instructions. Patient expresses understanding.

## 2016-03-18 NOTE — Patient Instructions (Signed)
Thank you for coming in today. Get shingles vaccine from pharmacy.  Get bone density test.  Return in 3 months or sooner if needed.

## 2016-03-19 ENCOUNTER — Encounter (HOSPITAL_COMMUNITY): Payer: Self-pay | Admitting: *Deleted

## 2016-03-19 ENCOUNTER — Inpatient Hospital Stay (HOSPITAL_COMMUNITY)
Admission: EM | Admit: 2016-03-19 | Discharge: 2016-03-23 | DRG: 481 | Disposition: A | Discharge status: Skilled Nursing Facility | Payer: Medicare Other | Attending: Internal Medicine | Admitting: Internal Medicine

## 2016-03-19 ENCOUNTER — Emergency Department (HOSPITAL_COMMUNITY): Payer: Medicare Other

## 2016-03-19 DIAGNOSIS — I10 Essential (primary) hypertension: Secondary | ICD-10-CM | POA: Diagnosis not present

## 2016-03-19 DIAGNOSIS — Z79899 Other long term (current) drug therapy: Secondary | ICD-10-CM

## 2016-03-19 DIAGNOSIS — I5030 Unspecified diastolic (congestive) heart failure: Secondary | ICD-10-CM

## 2016-03-19 DIAGNOSIS — Z888 Allergy status to other drugs, medicaments and biological substances status: Secondary | ICD-10-CM | POA: Diagnosis not present

## 2016-03-19 DIAGNOSIS — Z885 Allergy status to narcotic agent status: Secondary | ICD-10-CM

## 2016-03-19 DIAGNOSIS — I509 Heart failure, unspecified: Secondary | ICD-10-CM | POA: Diagnosis not present

## 2016-03-19 DIAGNOSIS — Z91010 Allergy to peanuts: Secondary | ICD-10-CM

## 2016-03-19 DIAGNOSIS — W19XXXA Unspecified fall, initial encounter: Secondary | ICD-10-CM

## 2016-03-19 DIAGNOSIS — Z91041 Radiographic dye allergy status: Secondary | ICD-10-CM | POA: Diagnosis not present

## 2016-03-19 DIAGNOSIS — Z880 Allergy status to penicillin: Secondary | ICD-10-CM | POA: Diagnosis not present

## 2016-03-19 DIAGNOSIS — Z85118 Personal history of other malignant neoplasm of bronchus and lung: Secondary | ICD-10-CM

## 2016-03-19 DIAGNOSIS — E441 Mild protein-calorie malnutrition: Secondary | ICD-10-CM | POA: Diagnosis present

## 2016-03-19 DIAGNOSIS — J441 Chronic obstructive pulmonary disease with (acute) exacerbation: Secondary | ICD-10-CM | POA: Diagnosis present

## 2016-03-19 DIAGNOSIS — Z66 Do not resuscitate: Secondary | ICD-10-CM | POA: Diagnosis not present

## 2016-03-19 DIAGNOSIS — Z9119 Patient's noncompliance with other medical treatment and regimen: Secondary | ICD-10-CM | POA: Diagnosis not present

## 2016-03-19 DIAGNOSIS — Z87891 Personal history of nicotine dependence: Secondary | ICD-10-CM | POA: Diagnosis not present

## 2016-03-19 DIAGNOSIS — R0602 Shortness of breath: Secondary | ICD-10-CM | POA: Diagnosis not present

## 2016-03-19 DIAGNOSIS — Z681 Body mass index (BMI) 19 or less, adult: Secondary | ICD-10-CM

## 2016-03-19 DIAGNOSIS — Z91013 Allergy to seafood: Secondary | ICD-10-CM | POA: Diagnosis not present

## 2016-03-19 DIAGNOSIS — Z8249 Family history of ischemic heart disease and other diseases of the circulatory system: Secondary | ICD-10-CM

## 2016-03-19 DIAGNOSIS — J449 Chronic obstructive pulmonary disease, unspecified: Secondary | ICD-10-CM | POA: Diagnosis not present

## 2016-03-19 DIAGNOSIS — N390 Urinary tract infection, site not specified: Secondary | ICD-10-CM | POA: Diagnosis present

## 2016-03-19 DIAGNOSIS — S72001A Fracture of unspecified part of neck of right femur, initial encounter for closed fracture: Secondary | ICD-10-CM | POA: Diagnosis not present

## 2016-03-19 DIAGNOSIS — I351 Nonrheumatic aortic (valve) insufficiency: Secondary | ICD-10-CM | POA: Diagnosis present

## 2016-03-19 DIAGNOSIS — S72141A Displaced intertrochanteric fracture of right femur, initial encounter for closed fracture: Secondary | ICD-10-CM | POA: Diagnosis not present

## 2016-03-19 DIAGNOSIS — E440 Moderate protein-calorie malnutrition: Secondary | ICD-10-CM | POA: Diagnosis not present

## 2016-03-19 DIAGNOSIS — D62 Acute posthemorrhagic anemia: Secondary | ICD-10-CM | POA: Diagnosis not present

## 2016-03-19 DIAGNOSIS — M25551 Pain in right hip: Secondary | ICD-10-CM | POA: Diagnosis not present

## 2016-03-19 DIAGNOSIS — G250 Essential tremor: Secondary | ICD-10-CM | POA: Diagnosis present

## 2016-03-19 DIAGNOSIS — W010XXA Fall on same level from slipping, tripping and stumbling without subsequent striking against object, initial encounter: Secondary | ICD-10-CM | POA: Diagnosis present

## 2016-03-19 DIAGNOSIS — B9620 Unspecified Escherichia coli [E. coli] as the cause of diseases classified elsewhere: Secondary | ICD-10-CM | POA: Diagnosis present

## 2016-03-19 DIAGNOSIS — R296 Repeated falls: Secondary | ICD-10-CM | POA: Diagnosis present

## 2016-03-19 DIAGNOSIS — Z419 Encounter for procedure for purposes other than remedying health state, unspecified: Secondary | ICD-10-CM

## 2016-03-19 LAB — CBC WITH DIFFERENTIAL/PLATELET
Basophils Absolute: 0 10*3/uL (ref 0.0–0.1)
Basophils Relative: 0 %
EOS ABS: 0 10*3/uL (ref 0.0–0.7)
Eosinophils Relative: 0 %
HEMATOCRIT: 38.4 % (ref 36.0–46.0)
HEMOGLOBIN: 13 g/dL (ref 12.0–15.0)
LYMPHS ABS: 1.5 10*3/uL (ref 0.7–4.0)
Lymphocytes Relative: 19 %
MCH: 30.3 pg (ref 26.0–34.0)
MCHC: 33.9 g/dL (ref 30.0–36.0)
MCV: 89.5 fL (ref 78.0–100.0)
MONO ABS: 0.5 10*3/uL (ref 0.1–1.0)
MONOS PCT: 7 %
NEUTROS PCT: 74 %
Neutro Abs: 5.8 10*3/uL (ref 1.7–7.7)
Platelets: 190 10*3/uL (ref 150–400)
RBC: 4.29 MIL/uL (ref 3.87–5.11)
RDW: 13.2 % (ref 11.5–15.5)
WBC: 7.9 10*3/uL (ref 4.0–10.5)

## 2016-03-19 LAB — BASIC METABOLIC PANEL
Anion gap: 8 (ref 5–15)
BUN: 12 mg/dL (ref 6–20)
CHLORIDE: 108 mmol/L (ref 101–111)
CO2: 26 mmol/L (ref 22–32)
CREATININE: 0.69 mg/dL (ref 0.44–1.00)
Calcium: 10.1 mg/dL (ref 8.9–10.3)
GFR calc non Af Amer: >60 mL/min (ref >60)
GLUCOSE: 106 mg/dL — AB (ref 65–99)
Potassium: 4.2 mmol/L (ref 3.5–5.1)
Sodium: 142 mmol/L (ref 135–145)

## 2016-03-19 LAB — URINALYSIS, ROUTINE W REFLEX MICROSCOPIC
Bilirubin Urine: NEGATIVE
Glucose, UA: NEGATIVE mg/dL
Ketones, ur: NEGATIVE mg/dL
Nitrite: POSITIVE — AB
PROTEIN: NEGATIVE mg/dL
Specific Gravity, Urine: 1.016 (ref 1.005–1.030)
pH: 6 (ref 5.0–8.0)

## 2016-03-19 LAB — URINE MICROSCOPIC-ADD ON
RBC / HPF: NONE SEEN RBC/hpf (ref 0–5)
SQUAMOUS EPITHELIAL / LPF: NONE SEEN

## 2016-03-19 LAB — TYPE AND SCREEN
ABO/RH(D): A POS
Antibody Screen: NEGATIVE

## 2016-03-19 LAB — PROTIME-INR
INR: 1.1 (ref 0.00–1.49)
PROTHROMBIN TIME: 13.9 s (ref 11.6–15.2)

## 2016-03-19 MED ORDER — FENTANYL CITRATE (PF) 100 MCG/2ML IJ SOLN
50.0000 ug | INTRAMUSCULAR | Status: AC | PRN
  Administered 2016-03-19 (×2): 50 ug via INTRAVENOUS
  Filled 2016-03-19 (×2): qty 2

## 2016-03-19 NOTE — ED Notes (Signed)
Patient transported to X-ray 

## 2016-03-19 NOTE — Consult Note (Signed)
This consult note was entered for convenience based on consultation with the emergency room physician.  I will actually examine her tomorrow morning and provide addendum as necessary based on exam. Reason for Consult:right intertrochanteric hip fracture Referring Physician: hospitalists  Sherri Singh is an 75 y.o. female.  HPI: the patient is a somewhat ill 75 year old female who fell onto her right hip earlier today.  She is brought to the emergency room she was noted to have a comminuted intertrochanteric hip fracture.  She will be admitted to the internal medicine service and cleared for surgical intervention.  I will plan on taking her to the operating room tomorrow for open reduction internal fixation of her right intratrochanteric hip fracture.  She denies previous history of injury to the hip.  Past Medical History  Diagnosis Date  . Lung cancer ([place])     lung ca dx 04/2009  . Benign essential tremor   . Lung cancer ([place])   . COPD (chronic obstructive pulmonary disease) [facility])     Past Surgical History  Procedure Laterality Date  . Deep brain stimulator placement    . Mouth surgery    . Breast surgery    . Appendectomy    . Ovary surgery      Family History  Problem Relation Age of Onset  . Cancer Mother     lung  . Heart attack Mother   . Diabetes Mother     Social History:  reports that she quit smoking about 18 years ago. She does not have any smokeless tobacco history on file. She reports that she does not drink alcohol or use illicit drugs.  Allergies:  Allergies  Allergen Reactions  . Codeine Other (See Comments)  . Other Other (See Comments)    Peanuts, Causes headaches Allergic to a type of pain medication, unable to recall name of medication at this time  . Budesonide-Formoterol Fumarate     REACTION: hoarseness  . Iohexol      Code: HIVES, Desc: pt developed hives 30+ yrs ago; needs 13 hour prep in future per Dr Leonia Reeves; 06/26/09 slg   . Morphine Other  (See Comments)    Unsure of Reaction  . Shellfish-Derived Products Other (See Comments)    Positive allergy test- has never had a reaction  . Iodine Rash    Boils on skin    Medications: I have reviewed the patient's current medications.  Results for orders placed or performed during the hospital encounter of 03/19/16 (from the past 48 hour(s))  Basic metabolic panel     Status: Abnormal   Collection Time: 03/19/16  9:39 PM  Result Value Ref Range   Sodium 142 135 - 145 mmol/L   Potassium 4.2 3.5 - 5.1 mmol/L   Chloride 108 101 - 111 mmol/L   CO2 26 22 - 32 mmol/L   Glucose, Bld 106 (H) 65 - 99 mg/dL   BUN 12 6 - 20 mg/dL   Creatinine, Ser 0.69 0.44 - 1.00 mg/dL   Calcium 10.1 8.9 - 10.3 mg/dL   GFR calc non Af Amer >60 >60 mL/min   GFR calc Af Amer >60 >60 mL/min    Comment: (NOTE) The eGFR has been calculated using the CKD EPI equation. This calculation has not been validated in all clinical situations. eGFR's persistently <60 mL/min signify possible Chronic Kidney Disease.    Anion gap 8 5 - 15  CBC WITH DIFFERENTIAL     Status: None   Collection Time: 03/19/16  9:39 PM  Result Value Ref Range   WBC 7.9 4.0 - 10.5 K/uL   RBC 4.29 3.87 - 5.11 MIL/uL   Hemoglobin 13.0 12.0 - 15.0 g/dL   HCT 38.4 36.0 - 46.0 %   MCV 89.5 78.0 - 100.0 fL   MCH 30.3 26.0 - 34.0 pg   MCHC 33.9 30.0 - 36.0 g/dL   RDW 13.2 11.5 - 15.5 %   Platelets 190 150 - 400 K/uL   Neutrophils Relative % 74 %   Neutro Abs 5.8 1.7 - 7.7 K/uL   Lymphocytes Relative 19 %   Lymphs Abs 1.5 0.7 - 4.0 K/uL   Monocytes Relative 7 %   Monocytes Absolute 0.5 0.1 - 1.0 K/uL   Eosinophils Relative 0 %   Eosinophils Absolute 0.0 0.0 - 0.7 K/uL   Basophils Relative 0 %   Basophils Absolute 0.0 0.0 - 0.1 K/uL  Protime-INR     Status: None   Collection Time: 03/19/16  9:39 PM  Result Value Ref Range   Prothrombin Time 13.9 11.6 - 15.2 seconds   INR 1.10 0.00 - 1.49  Type and screen [place]     Status: None   Collection Time: 03/19/16  9:39 PM  Result Value Ref Range   ABO/RH(D) A POS    Antibody Screen NEG    Sample Expiration 03/22/2016   Urinalysis, Routine w reflex microscopic (not at [facility])     Status: Abnormal   Collection Time: 03/19/16  9:59 PM  Result Value Ref Range   Color, Urine YELLOW YELLOW   APPearance CLOUDY (A) CLEAR   Specific Gravity, Urine 1.016 1.005 - 1.030   pH 6.0 5.0 - 8.0   Glucose, UA NEGATIVE NEGATIVE mg/dL   Hgb urine dipstick TRACE (A) NEGATIVE   Bilirubin Urine NEGATIVE NEGATIVE   Ketones, ur NEGATIVE NEGATIVE mg/dL   Protein, ur NEGATIVE NEGATIVE mg/dL   Nitrite POSITIVE (A) NEGATIVE   Leukocytes, UA SMALL (A) NEGATIVE  Urine microscopic-add on     Status: Abnormal   Collection Time: 03/19/16  9:59 PM  Result Value Ref Range   Squamous Epithelial / LPF NONE SEEN NONE SEEN   WBC, UA 6-30 0 - 5 WBC/hpf   RBC / HPF NONE SEEN 0 - 5 RBC/hpf   Bacteria, UA MANY (A) NONE SEEN    Dg Chest Port 1 View  03/19/2016  CLINICAL DATA:  Right hip fracture EXAM: PORTABLE CHEST 1 VIEW COMPARISON:  None. FINDINGS: Curvilinear scarring in the left upper lung. No airspace consolidation. No large effusion. No pneumothorax. Normal pulmonary vasculature. Implanted stimulating device superimposes the right hemi thorax with leads extending up the right neck. IMPRESSION: Curvilinear scarring in the upper left lung, treatment related. No acute cardiopulmonary findings. Electronically Signed   By: Andreas Newport M.D.   On: 03/19/2016 22:59   Dg Hip Unilat With Pelvis 2-3 Views Right  03/19/2016  CLINICAL DATA:  76 year old who fell and injured the right hip. Initial encounter. EXAM: DG HIP (WITH OR WITHOUT PELVIS) 2-3V RIGHT COMPARISON:  None. FINDINGS: Acute comminuted, mildly displaced intertrochanteric right femoral neck fracture. Hip joint intact with well preserved joint space for age. Included AP pelvis demonstrates a normal-appearing contralateral  left hip joint. Sacroiliac joints and symphysis pubis intact. Osseous demineralization. IMPRESSION: Acute traumatic comminuted, mildly displaced intertrochanteric right femoral neck fracture. Electronically Signed   By: Evangeline Dakin M.D.   On: 03/19/2016 21:35    ROS  ROS: I have  reviewed the patient's review of systems thoroughly and there are no positive responses as relates to the HPI. EXAM: Blood pressure 139/56, pulse 73, temperature 98.2 F (36.8 C), temperature source Oral, resp. rate 15, height 5' 4"  (1.626 m), weight 48.535 kg (107 lb), SpO2 99 %. Physical Exam Well-developed well-nourished patient in no acute distress. Pt unable to give significant history  HEENT:within normal limits Cardiac: Regular rate and rhythm Pulmonary: Lungs clear to auscultation Abdomen: Soft and nontender.  Normal active bowel sounds  Musculoskeletal: (right hip: externally rotated and shortened.  Painful range of motion.  Neurovascularly intact distally.) Assessment/Plan: 75 year old minimally conversant female with comminuted intertrochanteric hip fracture on the right side.  The patient will be admitted to the internal medicine service and cleared for surgical intervention.//I plan on taking her to the operating room tomorrow afternoon.  She should be n.p.o. After midnight tonight.I have had a prolonged discussion with the patient regarding the risk and benefits of the surgical procedure.  The patient understands the risks include but are not limited to bleeding, infection and failure of the surgery to cure the problem and need for further surgery.  The patient understands there is a slight risk of death at the time of surgery.  The patient understands these risks along with the potential benefits and wishes to proceed with surgical intervention.  The patient will be followed in the office in the postoperative period.  Jebediah Macrae L 03/19/2016, 11:18 PM   Pt see and examined 6/28 and surgery  discusssed as in note above.

## 2016-03-19 NOTE — ED Notes (Signed)
Per [employer], pt was walking to get the mail and fell.  C/o right hip pain.  No rotation, no shortening.  Pedal edeam bilat.  Pain with ROM.  Pt ambulates with rollator.

## 2016-03-19 NOTE — Progress Notes (Signed)
EDCM spoke to patient at bedside. Patient presents to the Ed post fall at home sustaining acute traumatic comminuted, mildly displaced intertrochanteric right femoral neck fracture.   Patient reports she lives alone.  She reports her son comes and checks in on her but he lives in [place].  Patient reports she does not have any friends or neighbors who check in on her.  Patient reports she has no home health services at this time, and never had home health services.  Patient reports she performs her ADL's on her own.  Patient reports she has a walker and a shower bench at home.  Patient does not drive.  She reports her son took her car for her safety.  Patient confirms her pcp is Dr. Georgina Snell in [place].  Foley catheter placed in the ED.  While [facility] speaking to patient, [facility] noticed patient's arms shaking.  Patient reports she has tremors, denies Parkinson's.  Patient's speech noted to be slow.

## 2016-03-19 NOTE — ED Notes (Signed)
Bed: WA01 Expected date:  Expected time:  Means of arrival:  Comments: EMS Rt hip pain

## 2016-03-20 ENCOUNTER — Encounter (HOSPITAL_COMMUNITY)
Admission: EM | Disposition: A | Discharge status: Skilled Nursing Facility | Payer: Self-pay | Source: Home / Self Care | Attending: Internal Medicine

## 2016-03-20 ENCOUNTER — Inpatient Hospital Stay (HOSPITAL_COMMUNITY): Payer: Medicare Other | Admitting: Certified Registered Nurse Anesthetist

## 2016-03-20 ENCOUNTER — Inpatient Hospital Stay (HOSPITAL_COMMUNITY): Payer: Medicare Other

## 2016-03-20 ENCOUNTER — Encounter (HOSPITAL_COMMUNITY): Payer: Self-pay | Admitting: Certified Registered"

## 2016-03-20 DIAGNOSIS — D62 Acute posthemorrhagic anemia: Secondary | ICD-10-CM | POA: Diagnosis not present

## 2016-03-20 DIAGNOSIS — B9620 Unspecified Escherichia coli [E. coli] as the cause of diseases classified elsewhere: Secondary | ICD-10-CM | POA: Diagnosis present

## 2016-03-20 DIAGNOSIS — Z880 Allergy status to penicillin: Secondary | ICD-10-CM | POA: Diagnosis not present

## 2016-03-20 DIAGNOSIS — Z66 Do not resuscitate: Secondary | ICD-10-CM | POA: Diagnosis present

## 2016-03-20 DIAGNOSIS — S72001A Fracture of unspecified part of neck of right femur, initial encounter for closed fracture: Secondary | ICD-10-CM | POA: Diagnosis present

## 2016-03-20 DIAGNOSIS — M25551 Pain in right hip: Secondary | ICD-10-CM | POA: Diagnosis present

## 2016-03-20 DIAGNOSIS — R262 Difficulty in walking, not elsewhere classified: Secondary | ICD-10-CM | POA: Diagnosis not present

## 2016-03-20 DIAGNOSIS — Z9119 Patient's noncompliance with other medical treatment and regimen: Secondary | ICD-10-CM | POA: Diagnosis not present

## 2016-03-20 DIAGNOSIS — I509 Heart failure, unspecified: Secondary | ICD-10-CM | POA: Diagnosis not present

## 2016-03-20 DIAGNOSIS — W19XXXA Unspecified fall, initial encounter: Secondary | ICD-10-CM | POA: Diagnosis not present

## 2016-03-20 DIAGNOSIS — R296 Repeated falls: Secondary | ICD-10-CM | POA: Diagnosis not present

## 2016-03-20 DIAGNOSIS — Z91010 Allergy to peanuts: Secondary | ICD-10-CM | POA: Diagnosis not present

## 2016-03-20 DIAGNOSIS — R51 Headache: Secondary | ICD-10-CM | POA: Diagnosis not present

## 2016-03-20 DIAGNOSIS — I351 Nonrheumatic aortic (valve) insufficiency: Secondary | ICD-10-CM | POA: Diagnosis present

## 2016-03-20 DIAGNOSIS — S7291XA Unspecified fracture of right femur, initial encounter for closed fracture: Secondary | ICD-10-CM | POA: Diagnosis not present

## 2016-03-20 DIAGNOSIS — R0602 Shortness of breath: Secondary | ICD-10-CM | POA: Diagnosis not present

## 2016-03-20 DIAGNOSIS — M6281 Muscle weakness (generalized): Secondary | ICD-10-CM | POA: Diagnosis not present

## 2016-03-20 DIAGNOSIS — Z4789 Encounter for other orthopedic aftercare: Secondary | ICD-10-CM | POA: Diagnosis not present

## 2016-03-20 DIAGNOSIS — Z681 Body mass index (BMI) 19 or less, adult: Secondary | ICD-10-CM | POA: Diagnosis not present

## 2016-03-20 DIAGNOSIS — I10 Essential (primary) hypertension: Secondary | ICD-10-CM | POA: Diagnosis not present

## 2016-03-20 DIAGNOSIS — S0990XA Unspecified injury of head, initial encounter: Secondary | ICD-10-CM | POA: Diagnosis not present

## 2016-03-20 DIAGNOSIS — S72141A Displaced intertrochanteric fracture of right femur, initial encounter for closed fracture: Secondary | ICD-10-CM | POA: Diagnosis not present

## 2016-03-20 DIAGNOSIS — I69922 Dysarthria following unspecified cerebrovascular disease: Secondary | ICD-10-CM | POA: Diagnosis not present

## 2016-03-20 DIAGNOSIS — J441 Chronic obstructive pulmonary disease with (acute) exacerbation: Secondary | ICD-10-CM | POA: Diagnosis not present

## 2016-03-20 DIAGNOSIS — R278 Other lack of coordination: Secondary | ICD-10-CM | POA: Diagnosis not present

## 2016-03-20 DIAGNOSIS — E640 Sequelae of protein-calorie malnutrition: Secondary | ICD-10-CM | POA: Diagnosis not present

## 2016-03-20 DIAGNOSIS — R000 Tachycardia, unspecified: Secondary | ICD-10-CM | POA: Diagnosis not present

## 2016-03-20 DIAGNOSIS — S72009A Fracture of unspecified part of neck of unspecified femur, initial encounter for closed fracture: Secondary | ICD-10-CM | POA: Diagnosis not present

## 2016-03-20 DIAGNOSIS — N390 Urinary tract infection, site not specified: Secondary | ICD-10-CM | POA: Diagnosis not present

## 2016-03-20 DIAGNOSIS — G250 Essential tremor: Secondary | ICD-10-CM | POA: Diagnosis not present

## 2016-03-20 DIAGNOSIS — Z91013 Allergy to seafood: Secondary | ICD-10-CM | POA: Diagnosis not present

## 2016-03-20 DIAGNOSIS — R1312 Dysphagia, oropharyngeal phase: Secondary | ICD-10-CM | POA: Diagnosis not present

## 2016-03-20 DIAGNOSIS — Z885 Allergy status to narcotic agent status: Secondary | ICD-10-CM | POA: Diagnosis not present

## 2016-03-20 DIAGNOSIS — Z87891 Personal history of nicotine dependence: Secondary | ICD-10-CM | POA: Diagnosis not present

## 2016-03-20 DIAGNOSIS — E440 Moderate protein-calorie malnutrition: Secondary | ICD-10-CM | POA: Diagnosis not present

## 2016-03-20 DIAGNOSIS — J449 Chronic obstructive pulmonary disease, unspecified: Secondary | ICD-10-CM | POA: Diagnosis not present

## 2016-03-20 DIAGNOSIS — Z91041 Radiographic dye allergy status: Secondary | ICD-10-CM | POA: Diagnosis not present

## 2016-03-20 DIAGNOSIS — Z888 Allergy status to other drugs, medicaments and biological substances status: Secondary | ICD-10-CM | POA: Diagnosis not present

## 2016-03-20 DIAGNOSIS — Z85118 Personal history of other malignant neoplasm of bronchus and lung: Secondary | ICD-10-CM | POA: Diagnosis not present

## 2016-03-20 DIAGNOSIS — S72143A Displaced intertrochanteric fracture of unspecified femur, initial encounter for closed fracture: Secondary | ICD-10-CM | POA: Diagnosis not present

## 2016-03-20 DIAGNOSIS — Z79899 Other long term (current) drug therapy: Secondary | ICD-10-CM | POA: Diagnosis not present

## 2016-03-20 DIAGNOSIS — Z8249 Family history of ischemic heart disease and other diseases of the circulatory system: Secondary | ICD-10-CM | POA: Diagnosis not present

## 2016-03-20 DIAGNOSIS — W010XXA Fall on same level from slipping, tripping and stumbling without subsequent striking against object, initial encounter: Secondary | ICD-10-CM | POA: Diagnosis present

## 2016-03-20 DIAGNOSIS — E441 Mild protein-calorie malnutrition: Secondary | ICD-10-CM | POA: Diagnosis not present

## 2016-03-20 HISTORY — PX: FEMUR IM NAIL: SHX1597

## 2016-03-20 LAB — SURGICAL PCR SCREEN
MRSA, PCR: INVALID — AB
Staphylococcus aureus: INVALID — AB

## 2016-03-20 LAB — ABO/RH: ABO/RH(D): A POS

## 2016-03-20 LAB — APTT: APTT: 28 s (ref 24–37)

## 2016-03-20 SURGERY — FIXATION, FRACTURE, INTERTROCHANTERIC, WITH INTRAMEDULLARY ROD
Anesthesia: General | Laterality: Right

## 2016-03-20 SURGERY — INSERTION, INTRAMEDULLARY ROD, FEMUR
Anesthesia: General | Laterality: Right

## 2016-03-20 MED ORDER — OXYCODONE HCL 5 MG PO TABS: 5.0000 mg | ORAL_TABLET | Freq: Once | ORAL | Status: DC | PRN

## 2016-03-20 MED ORDER — METHOCARBAMOL 500 MG PO TABS
500.0000 mg | ORAL_TABLET | Freq: Three times a day (TID) | ORAL | Status: DC | PRN
  Filled 2016-03-20: qty 1

## 2016-03-20 MED ORDER — ROCURONIUM BROMIDE 50 MG/5ML IV SOLN
INTRAVENOUS | Status: AC
  Filled 2016-03-20: qty 1

## 2016-03-20 MED ORDER — VITAMIN D 1000 UNITS PO TABS
1000.0000 [IU] | ORAL_TABLET | Freq: Every day | ORAL | Status: DC
  Administered 2016-03-21 – 2016-03-23 (×4): 1000 [IU] via ORAL
  Filled 2016-03-20 (×3): qty 1

## 2016-03-20 MED ORDER — NALOXONE HCL 0.4 MG/ML IJ SOLN
0.2000 mg | Freq: Once | INTRAMUSCULAR | Status: AC
  Administered 2016-03-20: 0.2 mg via INTRAVENOUS
  Filled 2016-03-20 (×2): qty 1

## 2016-03-20 MED ORDER — FENTANYL CITRATE (PF) 250 MCG/5ML IJ SOLN
INTRAMUSCULAR | Status: AC
  Filled 2016-03-20: qty 5

## 2016-03-20 MED ORDER — MUPIROCIN 2 % EX OINT
TOPICAL_OINTMENT | CUTANEOUS | Status: AC
  Filled 2016-03-20: qty 22

## 2016-03-20 MED ORDER — HYDRALAZINE HCL 20 MG/ML IJ SOLN
5.0000 mg | INTRAMUSCULAR | Status: DC | PRN
  Filled 2016-03-20: qty 1

## 2016-03-20 MED ORDER — PROPOFOL 10 MG/ML IV BOLUS
INTRAVENOUS | Status: DC | PRN
  Administered 2016-03-20: 80 mg via INTRAVENOUS

## 2016-03-20 MED ORDER — DOCUSATE SODIUM 100 MG PO CAPS
100.0000 mg | ORAL_CAPSULE | Freq: Two times a day (BID) | ORAL | Status: DC
  Administered 2016-03-20 – 2016-03-23 (×5): 100 mg via ORAL
  Filled 2016-03-20 (×5): qty 1

## 2016-03-20 MED ORDER — ONDANSETRON HCL 4 MG/2ML IJ SOLN
INTRAMUSCULAR | Status: DC | PRN
  Administered 2016-03-20: 4 mg via INTRAVENOUS

## 2016-03-20 MED ORDER — LIDOCAINE HCL (CARDIAC) 20 MG/ML IV SOLN
INTRAVENOUS | Status: DC | PRN
  Administered 2016-03-20: 60 mg via INTRAVENOUS

## 2016-03-20 MED ORDER — LACTATED RINGERS IV SOLN
INTRAVENOUS | Status: DC | PRN
  Administered 2016-03-20: 17:00:00 via INTRAVENOUS

## 2016-03-20 MED ORDER — OXYCODONE-ACETAMINOPHEN 5-325 MG PO TABS
1.0000 | ORAL_TABLET | ORAL | Status: DC | PRN
  Administered 2016-03-20: 1 via ORAL
  Filled 2016-03-20: qty 1

## 2016-03-20 MED ORDER — ASPIRIN EC 325 MG PO TBEC: 325.0000 mg | DELAYED_RELEASE_TABLET | Freq: Every day | ORAL | Status: AC

## 2016-03-20 MED ORDER — SUGAMMADEX SODIUM 200 MG/2ML IV SOLN
INTRAVENOUS | Status: AC
  Filled 2016-03-20: qty 2

## 2016-03-20 MED ORDER — DEXTROSE 5 % IV SOLN
1.0000 g | Freq: Every day | INTRAVENOUS | Status: DC
  Administered 2016-03-20 – 2016-03-22 (×4): 1 g via INTRAVENOUS
  Filled 2016-03-20 (×6): qty 10

## 2016-03-20 MED ORDER — MOMETASONE FURO-FORMOTEROL FUM 200-5 MCG/ACT IN AERO
2.0000 | INHALATION_SPRAY | Freq: Two times a day (BID) | RESPIRATORY_TRACT | Status: DC
  Administered 2016-03-20 (×2): 2 via RESPIRATORY_TRACT
  Filled 2016-03-20: qty 8.8

## 2016-03-20 MED ORDER — FERROUS SULFATE 325 (65 FE) MG PO TABS
325.0000 mg | ORAL_TABLET | Freq: Two times a day (BID) | ORAL | Status: DC
  Administered 2016-03-21 – 2016-03-23 (×4): 325 mg via ORAL
  Filled 2016-03-20 (×4): qty 1

## 2016-03-20 MED ORDER — METOPROLOL TARTRATE 100 MG PO TABS
100.0000 mg | ORAL_TABLET | Freq: Two times a day (BID) | ORAL | Status: DC
  Administered 2016-03-20 – 2016-03-23 (×7): 100 mg via ORAL
  Filled 2016-03-20: qty 4
  Filled 2016-03-20 (×3): qty 1
  Filled 2016-03-20: qty 4
  Filled 2016-03-20 (×3): qty 1

## 2016-03-20 MED ORDER — CEFAZOLIN SODIUM-DEXTROSE 2-4 GM/100ML-% IV SOLN
INTRAVENOUS | Status: AC
  Administered 2016-03-20: 2 g via INTRAVENOUS
  Filled 2016-03-20: qty 100

## 2016-03-20 MED ORDER — HYDROCODONE-ACETAMINOPHEN 5-325 MG PO TABS: 1.0000 | ORAL_TABLET | Freq: Four times a day (QID) | ORAL | Status: DC | PRN

## 2016-03-20 MED ORDER — LACTATED RINGERS IV SOLN
INTRAVENOUS | Status: DC
  Administered 2016-03-20: 14:00:00 via INTRAVENOUS

## 2016-03-20 MED ORDER — SODIUM CHLORIDE 0.9 % IV SOLN
INTRAVENOUS | Status: DC
  Administered 2016-03-20: 02:00:00 via INTRAVENOUS

## 2016-03-20 MED ORDER — ASPIRIN EC 325 MG PO TBEC
325.0000 mg | DELAYED_RELEASE_TABLET | Freq: Every day | ORAL | Status: DC
  Administered 2016-03-21 – 2016-03-23 (×3): 325 mg via ORAL
  Filled 2016-03-20 (×3): qty 1

## 2016-03-20 MED ORDER — PROMETHAZINE HCL 25 MG/ML IJ SOLN: 6.2500 mg | INTRAMUSCULAR | Status: DC | PRN

## 2016-03-20 MED ORDER — METOPROLOL TARTRATE 5 MG/5ML IV SOLN: 5.0000 mg | INTRAVENOUS | Status: DC | PRN

## 2016-03-20 MED ORDER — UMECLIDINIUM BROMIDE 62.5 MCG/INH IN AEPB
1.0000 | INHALATION_SPRAY | Freq: Every day | RESPIRATORY_TRACT | Status: DC | PRN
  Filled 2016-03-20: qty 7

## 2016-03-20 MED ORDER — POLYETHYLENE GLYCOL 3350 17 G PO PACK: 17.0000 g | PACK | Freq: Every day | ORAL | Status: DC | PRN

## 2016-03-20 MED ORDER — PHENYLEPHRINE HCL 10 MG/ML IJ SOLN
INTRAMUSCULAR | Status: DC | PRN
  Administered 2016-03-20 (×3): 80 ug via INTRAVENOUS

## 2016-03-20 MED ORDER — CHLORHEXIDINE GLUCONATE 4 % EX LIQD: 60.0000 mL | Freq: Once | CUTANEOUS | Status: DC

## 2016-03-20 MED ORDER — NALOXONE HCL 0.4 MG/ML IJ SOLN
0.2000 mg | Freq: Once | INTRAMUSCULAR | Status: AC
Start: 2016-03-20 — End: 2016-03-20
  Administered 2016-03-20: 0.2 mg via INTRAVENOUS
  Filled 2016-03-20: qty 1

## 2016-03-20 MED ORDER — PHENYLEPHRINE HCL 10 MG/ML IJ SOLN
10.0000 mg | INTRAVENOUS | Status: DC | PRN
  Administered 2016-03-20: 50 ug/min via INTRAVENOUS

## 2016-03-20 MED ORDER — HYDROCODONE-ACETAMINOPHEN 5-325 MG PO TABS
1.0000 | ORAL_TABLET | Freq: Four times a day (QID) | ORAL | Status: DC | PRN
  Filled 2016-03-20: qty 1

## 2016-03-20 MED ORDER — LIDOCAINE 2% (20 MG/ML) 5 ML SYRINGE
INTRAMUSCULAR | Status: AC
  Filled 2016-03-20: qty 5

## 2016-03-20 MED ORDER — ROCURONIUM BROMIDE 100 MG/10ML IV SOLN
INTRAVENOUS | Status: DC | PRN
  Administered 2016-03-20: 20 mg via INTRAVENOUS

## 2016-03-20 MED ORDER — ACETAMINOPHEN 650 MG RE SUPP: 650.0000 mg | Freq: Four times a day (QID) | RECTAL | Status: DC | PRN

## 2016-03-20 MED ORDER — SENNOSIDES-DOCUSATE SODIUM 8.6-50 MG PO TABS: 1.0000 | ORAL_TABLET | Freq: Every evening | ORAL | Status: DC | PRN

## 2016-03-20 MED ORDER — SODIUM CHLORIDE 0.9 % IV SOLN
INTRAVENOUS | Status: AC
  Administered 2016-03-20: 08:00:00 via INTRAVENOUS

## 2016-03-20 MED ORDER — ONDANSETRON HCL 4 MG/2ML IJ SOLN
INTRAMUSCULAR | Status: AC
  Filled 2016-03-20: qty 2

## 2016-03-20 MED ORDER — ONDANSETRON HCL 4 MG/2ML IJ SOLN: 4.0000 mg | Freq: Three times a day (TID) | INTRAMUSCULAR | Status: DC | PRN

## 2016-03-20 MED ORDER — FENTANYL CITRATE (PF) 100 MCG/2ML IJ SOLN
INTRAMUSCULAR | Status: DC | PRN
  Administered 2016-03-20: 50 ug via INTRAVENOUS

## 2016-03-20 MED ORDER — MUPIROCIN 2 % EX OINT
1.0000 "application " | TOPICAL_OINTMENT | Freq: Once | CUTANEOUS | Status: AC
  Administered 2016-03-20: 1 via TOPICAL

## 2016-03-20 MED ORDER — 0.9 % SODIUM CHLORIDE (POUR BTL) OPTIME
TOPICAL | Status: DC | PRN
  Administered 2016-03-20: 1000 mL

## 2016-03-20 MED ORDER — ACETAMINOPHEN 325 MG PO TABS
650.0000 mg | ORAL_TABLET | Freq: Four times a day (QID) | ORAL | Status: DC | PRN
  Administered 2016-03-20 – 2016-03-22 (×3): 650 mg via ORAL
  Filled 2016-03-20 (×3): qty 2

## 2016-03-20 MED ORDER — HYDROMORPHONE HCL 1 MG/ML IJ SOLN
0.5000 mg | INTRAMUSCULAR | Status: DC | PRN
  Administered 2016-03-20 (×2): 0.5 mg via INTRAVENOUS
  Filled 2016-03-20 (×2): qty 1

## 2016-03-20 MED ORDER — CEFAZOLIN SODIUM-DEXTROSE 2-4 GM/100ML-% IV SOLN
2.0000 g | Freq: Four times a day (QID) | INTRAVENOUS | Status: AC
  Administered 2016-03-21 (×2): 2 g via INTRAVENOUS
  Filled 2016-03-20 (×2): qty 100

## 2016-03-20 MED ORDER — MORPHINE SULFATE (PF) 2 MG/ML IV SOLN: 1.0000 mg | INTRAVENOUS | Status: DC | PRN

## 2016-03-20 MED ORDER — SUGAMMADEX SODIUM 200 MG/2ML IV SOLN
INTRAVENOUS | Status: DC | PRN
  Administered 2016-03-20: 97 mg via INTRAVENOUS

## 2016-03-20 MED ORDER — CEFAZOLIN SODIUM-DEXTROSE 2-4 GM/100ML-% IV SOLN: 2.0000 g | INTRAVENOUS | Status: DC

## 2016-03-20 MED ORDER — OXYCODONE HCL 5 MG/5ML PO SOLN: 5.0000 mg | Freq: Once | ORAL | Status: DC | PRN

## 2016-03-20 MED ORDER — HYDROMORPHONE HCL 1 MG/ML IJ SOLN: 0.2500 mg | INTRAMUSCULAR | Status: DC | PRN

## 2016-03-20 MED ORDER — PROPOFOL 10 MG/ML IV BOLUS
INTRAVENOUS | Status: AC
  Filled 2016-03-20: qty 20

## 2016-03-20 SURGICAL SUPPLY — 42 items
BIT DRILL 4.3MMS DISTAL GRDTED (BIT) IMPLANT
BLADE SURG ROTATE 9660 (MISCELLANEOUS) ×3 IMPLANT
COVER PERINEAL POST (MISCELLANEOUS) ×3 IMPLANT
COVER SURGICAL LIGHT HANDLE (MISCELLANEOUS) ×6 IMPLANT
DECANTER SPIKE VIAL GLASS SM (MISCELLANEOUS) ×3 IMPLANT
DRAPE STERI IOBAN 125X83 (DRAPES) ×3 IMPLANT
DRILL 4.3MMS DISTAL GRADUATED (BIT) ×3
DRSG MEPILEX BORDER 4X4 (GAUZE/BANDAGES/DRESSINGS) ×7 IMPLANT
DRSG MEPILEX BORDER 4X8 (GAUZE/BANDAGES/DRESSINGS) ×5 IMPLANT
DURAPREP 26ML APPLICATOR (WOUND CARE) ×3 IMPLANT
ELECT CAUTERY BLADE 6.4 (BLADE) ×3 IMPLANT
ELECT REM PT RETURN 9FT ADLT (ELECTROSURGICAL) ×3
ELECTRODE REM PT RTRN 9FT ADLT (ELECTROSURGICAL) ×1 IMPLANT
EVACUATOR 1/8 PVC DRAIN (DRAIN) IMPLANT
GAUZE XEROFORM 5X9 LF (GAUZE/BANDAGES/DRESSINGS) ×3 IMPLANT
GLOVE BIOGEL PI IND STRL 8 (GLOVE) ×2 IMPLANT
GLOVE BIOGEL PI INDICATOR 8 (GLOVE) ×4
GLOVE ECLIPSE 7.5 STRL STRAW (GLOVE) ×6 IMPLANT
GOWN STRL REUS W/ TWL LRG LVL3 (GOWN DISPOSABLE) ×1 IMPLANT
GOWN STRL REUS W/ TWL XL LVL3 (GOWN DISPOSABLE) ×2 IMPLANT
GOWN STRL REUS W/TWL LRG LVL3 (GOWN DISPOSABLE) ×3
GOWN STRL REUS W/TWL XL LVL3 (GOWN DISPOSABLE) ×6
GUIDEPIN 3.2X17.5 THRD DISP (PIN) ×4 IMPLANT
GUIDEWIRE BALL NOSE 100CM (WIRE) ×2 IMPLANT
HFN RH 130 DEG 11MM X 360MM (Orthopedic Implant) ×2 IMPLANT
KIT BASIN OR (CUSTOM PROCEDURE TRAY) ×3 IMPLANT
KIT ROOM TURNOVER OR (KITS) ×3 IMPLANT
LINER BOOT UNIVERSAL DISP (MISCELLANEOUS) ×3 IMPLANT
MANIFOLD NEPTUNE II (INSTRUMENTS) ×3 IMPLANT
NS IRRIG 1000ML POUR BTL (IV SOLUTION) ×3 IMPLANT
PACK GENERAL/GYN (CUSTOM PROCEDURE TRAY) ×3 IMPLANT
PAD ARMBOARD 7.5X6 YLW CONV (MISCELLANEOUS) ×6 IMPLANT
SCREW BONE CORTICAL 5.0X36 (Screw) ×2 IMPLANT
SCREW LAG HIP NAIL 10.5X95 (Screw) ×2 IMPLANT
SCREWDRIVER HEX TIP 3.5MM (MISCELLANEOUS) ×2 IMPLANT
STAPLER VISISTAT 35W (STAPLE) ×3 IMPLANT
SUT VIC AB 0 CTB1 27 (SUTURE) ×2 IMPLANT
SUT VIC AB 1 CTB1 27 (SUTURE) ×3 IMPLANT
SUT VIC AB 2-0 CTB1 (SUTURE) ×3 IMPLANT
TOWEL OR 17X24 6PK STRL BLUE (TOWEL DISPOSABLE) ×3 IMPLANT
TOWEL OR 17X26 10 PK STRL BLUE (TOWEL DISPOSABLE) ×3 IMPLANT
WATER STERILE IRR 1000ML POUR (IV SOLUTION) ×3 IMPLANT

## 2016-03-20 NOTE — Progress Notes (Signed)
LCSWA briefly spoke patient and family about disposition to SNF. Patient family stated they are interested in [place] educated family and gave instructions to follow up with CSW over at [facility]   Kathrin Greathouse, Latanya Presser, MSW Clinical Social Worker 5E and Psychiatric Service Line [phone] 03/20/2016  3:03 PM

## 2016-03-20 NOTE — Anesthesia Procedure Notes (Signed)
Procedure Name: Intubation Date/Time: 03/20/2016 5:04 PM Performed by: Manus Gunning, Kaizlee Carlino J Pre-anesthesia Checklist: Patient identified, Emergency Drugs available, Suction available, Timeout performed and Patient being monitored Patient Re-evaluated:Patient Re-evaluated prior to inductionOxygen Delivery Method: Circle system utilized Preoxygenation: Pre-oxygenation with 100% oxygen Intubation Type: IV induction Ventilation: Mask ventilation without difficulty Laryngoscope Size: Mac and 3 Grade View: Grade I Tube type: Oral Tube size: 7.5 mm Number of attempts: 1 Placement Confirmation: ETT inserted through vocal cords under direct vision,  positive ETCO2 and breath sounds checked- equal and bilateral Secured at: 20 cm Tube secured with: Tape Dental Injury: Teeth and Oropharynx as per pre-operative assessment

## 2016-03-20 NOTE — ED Notes (Signed)
Patient transported to CT 

## 2016-03-20 NOTE — Transfer of Care (Signed)
Immediate Anesthesia Transfer of Care Note  Patient: Sherri Singh  Procedure(s) Performed: Procedure(s): INTRAMEDULLARY (IM) NAIL INTERTROCHANTRIC (Right)  Patient Location: PACU  Anesthesia Type:General  Level of Consciousness: awake  Airway & Oxygen Therapy: Patient Spontanous Breathing and Patient connected to nasal cannula oxygen  Post-op Assessment: Report given to RN and Post -op Vital signs reviewed and stable  Post vital signs: Reviewed and stable  Last Vitals:  Filed Vitals:   03/20/16 1350 03/20/16 1810  BP: 140/52 150/67  Pulse: 75 84  Temp: 36.9 C 36.5 C  Resp: 16 28    Last Pain:  Filed Vitals:   03/20/16 1812  PainSc: Asleep      Patients Stated Pain Goal: 2 (82/64/15 8309)  Complications: No apparent anesthesia complications

## 2016-03-20 NOTE — ED Provider Notes (Signed)
CSN: [id number]     Arrival date & time 03/19/16  2043 History   First MD Initiated Contact with Patient 03/19/16 2107     Chief Complaint  Patient presents with  . Fall    right hip pain     (Consider location/radiation/quality/duration/timing/severity/associated sxs/prior Treatment) Patient is a 75 y.o. female presenting with fall. The history is provided by the patient and the EMS personnel.  Fall This is a new (pt was walking in her house and lost her balance falling on her right hip.  unable to get up or walk after fall) problem. The current episode started 1 to 2 hours ago. The problem occurs constantly. The problem has not changed since onset.Associated symptoms comments: Right hip pain.  Pain is 10/10 and sharp in nature. The symptoms are aggravated by bending and twisting. Nothing relieves the symptoms. She has tried nothing for the symptoms. The treatment provided no relief.    Past Medical History  Diagnosis Date  . Lung cancer ([place])     lung ca dx 04/2009  . Benign essential tremor   . Lung cancer ([place])   . COPD (chronic obstructive pulmonary disease) [facility])    Past Surgical History  Procedure Laterality Date  . Deep brain stimulator placement    . Mouth surgery    . Breast surgery    . Appendectomy    . Ovary surgery     Family History  Problem Relation Age of Onset  . Cancer Mother     lung  . Heart attack Mother   . Diabetes Mother    Social History  Substance Use Topics  . Smoking status: Former Smoker -- 25 years    Quit date: 09/23/1997  . Smokeless tobacco: None  . Alcohol Use: No   OB History    Gravida Para Term Preterm AB TAB SAB Ectopic Multiple Living   '4 4 4       4     '$ Review of Systems  All other systems reviewed and are negative.     Allergies  Codeine; Other; Budesonide-formoterol fumarate; Iohexol; Morphine; Shellfish-derived products; and Iodine  Home Medications   Prior to Admission medications   Medication Sig Start Date  End Date Taking? Authorizing Provider  AMBULATORY NON FORMULARY MEDICATION Compression stockings bilateral knee highs  for bilateral leg/ankle edema 01/20/15  Yes Jade L Breeback, PA-C  AMBULATORY NON FORMULARY MEDICATION 1 Device by Other route continuous. four wheel rollator style walker Code: G25.0 01/29/16  Yes Gregor Hams, MD  budesonide-formoterol [facility]) 160-4.5 MCG/ACT inhaler Inhale 2 puffs into the lungs 2 (two) times daily. 11/07/15  Yes Gregor Hams, MD  Cholecalciferol (VITAMIN D PO) Take 1 tablet by mouth daily.    Yes Historical Provider, MD  metoprolol (LOPRESSOR) 50 MG tablet TAKE TWO TABLETS BY MOUTH TWICE DAILY 02/27/16  Yes Gregor Hams, MD  umeclidinium bromide (INCRUSE ELLIPTA) 62.5 MCG/INH AEPB Inhale 1 puff into the lungs daily. Patient taking differently: Inhale 1 puff into the lungs daily as needed (SOB, wheezing).  01/16/16  Yes Gregor Hams, MD  Zoster Vaccine Live, PF, (ZOSTAVAX) 30076 UNT/0.65ML injection Inject 19,400 Units into the skin once. If given in pharmacy fax report to Dr Georgina Snell [phone] Patient not taking: Reported on 03/19/2016 03/18/16   Gregor Hams, MD   BP 167/64 mmHg  Pulse 81  Temp(Src) 98.2 F (36.8 C) (Oral)  Resp 20  Ht '5\' 4"'$  (1.626 m)  Wt 107 lb (48.535 kg)  BMI 18.36 kg/m2  SpO2 100% Physical Exam  Constitutional: She is oriented to person, place, and time. She appears well-developed and well-nourished. No distress.  HENT:  Head: Normocephalic and atraumatic.  Eyes: EOM are normal. Pupils are equal, round, and reactive to light.  Neck: No spinous process tenderness and no muscular tenderness present.  Cardiovascular: Normal rate, regular rhythm, normal heart sounds and intact distal pulses.  Exam reveals no friction rub.   No murmur heard. Pulmonary/Chest: Effort normal and breath sounds normal. She has no wheezes. She has no rales.  Device present in the left upper chest wall  Abdominal: Soft. Bowel sounds are normal. She exhibits no  distension. There is no tenderness. There is no rebound and no guarding.  Musculoskeletal: Normal range of motion. She exhibits tenderness.  Right leg is mildly flexed with significant pain with palpation of the right hip.  Limited ROM due to pain. No edema  Neurological: She is alert and oriented to person, place, and time. No cranial nerve deficit.  Generalized resting tremor  Skin: Skin is warm and dry. No rash noted.  Psychiatric: She has a normal mood and affect. Her behavior is normal.  Nursing note and vitals reviewed.   ED Course  Procedures (including critical care time) Labs Review Labs Reviewed  BASIC METABOLIC PANEL - Abnormal; Notable for the following:    Glucose, Bld 106 (*)    All other components within normal limits  URINALYSIS, ROUTINE W REFLEX MICROSCOPIC (NOT AT [facility]) - Abnormal; Notable for the following:    APPearance CLOUDY (*)    Hgb urine dipstick TRACE (*)    Nitrite POSITIVE (*)    Leukocytes, UA SMALL (*)    All other components within normal limits  URINE MICROSCOPIC-ADD ON - Abnormal; Notable for the following:    Bacteria, UA MANY (*)    All other components within normal limits  CBC WITH DIFFERENTIAL/PLATELET  PROTIME-INR  TYPE AND SCREEN    Imaging Review Dg Chest Port 1 View  03/19/2016  CLINICAL DATA:  Right hip fracture EXAM: PORTABLE CHEST 1 VIEW COMPARISON:  None. FINDINGS: Curvilinear scarring in the left upper lung. No airspace consolidation. No large effusion. No pneumothorax. Normal pulmonary vasculature. Implanted stimulating device superimposes the right hemi thorax with leads extending up the right neck. IMPRESSION: Curvilinear scarring in the upper left lung, treatment related. No acute cardiopulmonary findings. Electronically Signed   By: Andreas Newport M.D.   On: 03/19/2016 22:59   Dg Hip Unilat With Pelvis 2-3 Views Right  03/19/2016  CLINICAL DATA:  75 year old who fell and injured the right hip. Initial encounter. EXAM: DG HIP  (WITH OR WITHOUT PELVIS) 2-3V RIGHT COMPARISON:  None. FINDINGS: Acute comminuted, mildly displaced intertrochanteric right femoral neck fracture. Hip joint intact with well preserved joint space for age. Included AP pelvis demonstrates a normal-appearing contralateral left hip joint. Sacroiliac joints and symphysis pubis intact. Osseous demineralization. IMPRESSION: Acute traumatic comminuted, mildly displaced intertrochanteric right femoral neck fracture. Electronically Signed   By: Evangeline Dakin M.D.   On: 03/19/2016 21:35   I have personally reviewed and evaluated these images and lab results as part of my medical decision-making.   EKG Interpretation   Date/Time:  Tuesday March 19 2016 21:14:04 EDT Ventricular Rate:  83 PR Interval:    QRS Duration: 104 QT Interval:  388 QTC Calculation: 455 R Axis:   85 Text Interpretation:  Accelerated Junctional rhythm Incomplete right  bundle branch block Nonspecific ST abnormality No significant  change since  last tracing Confirmed by [facility]  MD, Loree Fee ([id number]) on 03/19/2016  9:44:28 PM      MDM   Final diagnoses:  Fall    Patient is a 75 year old female who lost her balance and fell today. She is complaining of right hip pain but denies any head injury or loss of consciousness. She does not take any anticoagulation. She denies any chest or abdominal pain. On exam patient has significant pain in her right hip. No other areas of injury or tenderness. Able to range all other extremities. No neck pain. Mental status is within normal limits. Hip fracture protocol initiated.  Imaging is consistent with an acute comminuted mildly displaced intertrochanteric femoral neck fracture. Foley catheter was placed and patient was given IV fentanyl. EKG without significant changes. Chest x-ray showed scarring of the lung where she had received treatment for cancer. Labs within normal limits. Spoke with Dr. Berenice Primas with orthopedics who recommended nothing by  mouth after midnight for surgery tomorrow. Will admit for further care.     Blanchie Dessert, MD 03/20/16 1306

## 2016-03-20 NOTE — Progress Notes (Signed)
Initial Nutrition Assessment  DOCUMENTATION CODES:   Severe malnutrition in context of chronic illness  INTERVENTION:  -Ensure Enlive po TID, each supplement provides 350 kcal and 20 grams of protein w/ advancement -Encourage PO intake  NUTRITION DIAGNOSIS:   Malnutrition related to chronic illness as evidenced by severe depletion of muscle mass, moderate depletion of body fat, moderate depletions of muscle mass, percent weight loss.  GOAL:   Patient will meet greater than or equal to 90% of their needs  MONITOR:   PO intake, Supplement acceptance, I & O's, Labs, Weight trends  REASON FOR ASSESSMENT:   Consult Hip fracture protocol  ASSESSMENT:   Sherri Singh is a 75 y.o. 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  Spoke with Sherri Singh today, she was in quite a bit of pain during my visit and was slow to answer many questions. She did have family at bedside that states she eats well when they visit her, but stated "she eats like a rabbit." Patient lives in a facility that brings her 2 meals per day and she has to find dinner for herself.  She is likely not meeting needs as evidenced by her 31#/23% severe wt loss in 6 months.   Likely related to COPD and Cancer treatments, though it is now in remission.  Son states she drinks vanilla ensure at home, will provide after surgery.  Nutrition-Focused physical exam completed. Findings are moderate fat depletion, moderate-severe muscle depletion, and no edema.   Encouraged pt to eat as much as possible and continue consuming ensure. Needs will be elevated with healing from hip fx.  Labs and medications reviewed: Vitamin D daily  Diet Order:  Diet NPO time specified Except for: Sips with Meds Diet NPO time specified Except  for: [employer], Sips with Meds Diet Heart Room service appropriate?: Yes; Fluid consistency:: Thin  Skin:  Wound (see comment) (Ecchymosis to Leg, Lip, and Hand)  Last BM:  6/26  Height:   Ht Readings from Last 1 Encounters:  03/20/16 '5\' 4"'$  (1.626 m)    Weight:   Wt Readings from Last 1 Encounters:  03/20/16 106 lb 14.8 oz (48.5 kg)    Ideal Body Weight:  54.54 kg  BMI:  Body mass index is 18.34 kg/(m^2).  Estimated Nutritional Needs:   Kcal:  1450-1650 calories  Protein:  50-65 grams  Fluid:  >/= 1.45L  EDUCATION NEEDS:   Education needs addressed  Sherri Anis. Sherri Collier, MS, RD LDN Inpatient Clinical Dietitian Pager [phone]

## 2016-03-20 NOTE — Anesthesia Preprocedure Evaluation (Addendum)
Anesthesia Evaluation  Patient identified by MRN, date of birth, ID band Patient awake    Reviewed: Allergy & Precautions, H&P , NPO status , Patient's Chart, lab work & pertinent test results  History of Anesthesia Complications Negative for: history of anesthetic complications  Airway Mallampati: I  TM Distance: >3 FB Neck ROM: full    Dental  (+) Edentulous Lower, Edentulous Upper   Pulmonary COPD, former smoker,   Actually is clear to auscultation Pulmonary exam normal breath sounds clear to auscultation       Cardiovascular hypertension, Normal cardiovascular exam Rhythm:regular Rate:Normal  Echo with preserved EF 60%, moderate AI   Neuro/Psych PSYCHIATRIC DISORDERS negative neurological ROS     GI/Hepatic negative GI ROS, Neg liver ROS,   Endo/Other  negative endocrine ROS  Renal/GU negative Renal ROS     Musculoskeletal   Abdominal   Peds  Hematology negative hematology ROS (+)   Anesthesia Other Findings Very slow with speech, is able to understand questioning, deep brain stimulator in place with generator in right chest  Reproductive/Obstetrics negative OB ROS                           Anesthesia Physical Anesthesia Plan  ASA: III  Anesthesia Plan: General   Post-op Pain Management:    Induction: Intravenous  Airway Management Planned: Oral ETT  Additional Equipment:   Intra-op Plan:   Post-operative Plan: Possible Post-op intubation/ventilation  Informed Consent: I have reviewed the patients History and Physical, chart, labs and discussed the procedure including the risks, benefits and alternatives for the proposed anesthesia with the patient or authorized representative who has indicated his/her understanding and acceptance.   Dental Advisory Given  Plan Discussed with: Anesthesiologist, CRNA and Surgeon  Anesthesia Plan Comments:         Anesthesia Quick  Evaluation

## 2016-03-20 NOTE — Progress Notes (Signed)
OT Cancellation Note  Patient Details Name: ANNJANETTE WERTENBERGER MRN: [id number] DOB: [birth year]   Cancelled Treatment:    Reason Eval/Treat Not Completed: Other (comment).  Pt  Has not had sx yet. Please reorder OT after this. Thank you.  [place] 03/20/2016, 6:49 AM  Lesle Chris, OTR/L [phone] 03/20/2016

## 2016-03-20 NOTE — Progress Notes (Signed)
PT Cancellation Note  Patient Details Name: Sherri Singh MRN: [id number] DOB: 1941-03-20   Cancelled Treatment:     PT order received but eval deferred - pt admitted with hip fx and surgery scheduled for this date by Dr Berenice Primas.  Please reorder PT services post op.   [name] 03/20/2016, 7:52 AM

## 2016-03-20 NOTE — Progress Notes (Signed)
Report called to [place], RN @ short stay

## 2016-03-20 NOTE — Progress Notes (Addendum)
PROGRESS NOTE                                                                                                                                                                                                             Patient Demographics:    Sherri Singh, is a 75 y.o. female, DOB - 07-Jan-1941, TML:[id number]  Admit date - 03/19/2016   Admitting Physician Ivor Costa, MD  Outpatient Primary MD for the patient is Lynne Leader, MD  LOS - 0  Chief Complaint  Patient presents with  . Fall    right hip pain       Brief Narrative    Sherri Singh is a 75 y.o. 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  Pt states that she lost her balance and fell when she was walking to get her mail at about 8: 00 PM today. She developed pain in right hip pain, but denies any head injury, neck pain or loss of consciousness. Her right hip pain is constant, severe, nonradiating. She does not have muscle spasm. No numbness in her right leg. She did not have dizziness, unilateral weakness or numbness in her extremities before fall. Patient has an increased urinary frequency, but no dysuria or burning on urination. No nausea, vomiting, diarrhea, abdominal pain, chest pain, cough, shortness of breath.   ED Course: pt was found to have positive UA for UTI, INR 1.1, WBC 7.9, temperature normal, slightly bradycardia, electrolytes and renal function okay. Chest x-ray showed scarring of the left upper lung where she had received treatment for cancer. X-ray showd acute comminuted mildly displaced intertrochanteric femoral neck fracture. Pt is admitted to [facility] bed as inpatient for further eval and treatment. Also appears surgery was consulted by EDP.    Subjective:    Sherri Singh today has, No headache, No chest pain, No abdominal pain - No Nausea, No new weakness tingling or numbness, No Cough - SOB. Does have right hip pain.   Assessment  & Plan :     1.Right femoral neck fracture due to Mechanical fall in a patient with history of multiple falls who is noncompliant with using her walker and cane. Counseled on  compliance.  Orthopedics consulted, discussed the case with Dr. Berenice Primas, no opening at [facility] for surgery, per Dr. Berenice Primas patient to be transferred to [facility] for [facility].  Cardio-Pulm Risk stratification for surgery and recommendations to minimize the same:-  A.Cardio-Pulmonary Risk -  this patient is a moderate to high risk  for adverse Cardio-Pulmonary  Outcome  from surgery, the risks and benefits were discussed and acceptable to the patient and son.  Recommendations for optimizing Cardio-Pulmonary  Risk risk factors  1. Keep SBP<140, HR<85, use Lopressor '5mg'$  IV q4hrs PRN, or B.Blocker drip PRN. 2. Moniotr I&Os. 3. Minimal sedation and Narcotics. 4. Good pulmunary toilet. 5. PRN Nebs and as needed oxygen to keep Pox>90% 6. Hb>8, transfuse as needed- Lasix '10mg'$  IV after each unit PRBC Transfused.   B.Bleeding Risk - no previous surgical complications, no easy bruising,  Antiplate meds none before surgery.   Lab Results  Component Value Date   PLT 190 03/19/2016                  Lab Results  Component Value Date   INR 1.10 03/19/2016   INR 1.0 05/04/2009      Will request Surgeon to please Order DVT prophylaxis of his/her choice, along with activity, weight bearing precautions and diet if appropriate.     2. Essential hypertension. Currently on beta blocker oral along with IV as needed.  3. History of COPD. No wheezing. Supportive care with oxygen and nebulizer treatments as needed.  4. UTI. On Rocephin and follow cultures.  5. History of frequent falls. Noncompliant with walker and cane,  counseled, postop PT and placement. May require long-term placement.  6. Mild protein calorie malnutrition. Place on protein supplements postop.  7. History of small cell lung cancer. Finished her treatments quite a while ago, currently presumed to be in remission. Outpatient follow-up with PCP and primary oncologist post discharge. No acute issues at this time.  8. Nonspecific EKG with poor baseline. Has been unchanged since December 2016, no chest symptoms. No chest pain or shortness of breath. Echo from 6 months ago below.  TTE  (04/2015) - Left ventricle: The cavity size was normal. Systolic function was normal. The estimated ejection fraction was in the range of 60% to 65%. Wall motion was normal; there were no regional wall motion abnormalities.  Aortic valve: There was moderate regurgitation.    Family Communication  :  Son bedside  Code Status :  Full  Diet : Nothing by mouth currently then heart healthy diet once out of surgery  Disposition Plan  : Transfer to: Per orthopedics, thereafter SNF  Consults  : Dr. Berenice Primas orthopedics  Procedures  : Pending surgical correction of right femoral fracture  DVT Prophylaxis  :  SCDs   Lab Results  Component Value Date   PLT 190 03/19/2016    Inpatient Medications  Scheduled Meds: . cefTRIAXone (ROCEPHIN)  IV  1 g Intravenous QHS  . cholecalciferol  1,000 Units Oral Daily  . metoprolol  100 mg Oral BID  . mometasone-formoterol  2 puff Inhalation BID   Continuous Infusions: . sodium chloride 75 mL/hr at 03/20/16 0810   PRN Meds:.hydrALAZINE, methocarbamol, metoprolol, morphine injection, ondansetron, oxyCODONE-acetaminophen, senna-docusate, umeclidinium bromide  Antibiotics  :    Anti-infectives    Start     Dose/Rate Route Frequency Ordered Stop   03/20/16 0100  cefTRIAXone (ROCEPHIN) 1 g in dextrose 5 % 50 mL IVPB     1 g  100 mL/hr over 30 Minutes Intravenous Daily at bedtime 03/20/16 0050           Objective:    Filed Vitals:   03/20/16 0222 03/20/16 0240 03/20/16 0532 03/20/16 0854  BP: 157/70  139/56   Pulse: 90  91   Temp: 99.1 F (37.3 C)  99.8 F (37.7 C)   TempSrc: Oral  Oral   Resp: 20  20   Height:  '5\' 4"'$  (1.626 m)    Weight:  48.5 kg (106 lb 14.8 oz)    SpO2: 95%  95% 95%    Wt Readings from Last 3 Encounters:  03/20/16 48.5 kg (106 lb 14.8 oz)  03/18/16 48.535 kg (107 lb)  01/16/16 49.896 kg (110 lb)     Intake/Output Summary (Last 24 hours) at 03/20/16 1140 Last data filed at 03/20/16 0532  Gross per 24 hour  Intake      0 ml  Output    450 ml  Net   -450 ml     Physical Exam  Awake Alert, Oriented X 3, No new F.N deficits, Normal affect [place].AT,PERRAL Supple Neck,No JVD, No cervical lymphadenopathy appriciated.  Symmetrical Chest wall movement, Good air movement bilaterally, CTAB RRR,No Gallops,Rubs or new Murmurs, No Parasternal Heave +ve B.Sounds, Abd Soft, No tenderness, No organomegaly appriciated, No rebound - guarding or rigidity. No Cyanosis, Clubbing or edema, No new Rash or bruise, R hip internally rotated     Data Review:    CBC  Recent Labs Lab 03/19/16 2139  WBC 7.9  HGB 13.0  HCT 38.4  PLT 190  MCV 89.5  MCH 30.3  MCHC 33.9  RDW 13.2  LYMPHSABS 1.5  MONOABS 0.5  EOSABS 0.0  BASOSABS 0.0    Chemistries   Recent Labs Lab 03/19/16 2139  NA 142  K 4.2  CL 108  CO2 26  GLUCOSE 106*  BUN 12  CREATININE 0.69  CALCIUM 10.1   ------------------------------------------------------------------------------------------------------------------ No results for input(s): CHOL, HDL, LDLCALC, TRIG, CHOLHDL, LDLDIRECT in the last 72 hours.  No results found for: HGBA1C ------------------------------------------------------------------------------------------------------------------ No results for input(s): TSH, T4TOTAL, T3FREE, THYROIDAB in the last 72 hours.  Invalid input(s):  FREET3 ------------------------------------------------------------------------------------------------------------------ No results for input(s): VITAMINB12, FOLATE, FERRITIN, TIBC, IRON, RETICCTPCT in the last 72 hours.  Coagulation profile  Recent Labs Lab 03/19/16 2139  INR 1.10    No results for input(s): DDIMER in the last 72 hours.  Cardiac Enzymes No results for input(s): CKMB, TROPONINI, MYOGLOBIN in the last 168 hours.  Invalid input(s): CK ------------------------------------------------------------------------------------------------------------------ No results found for: BNP  Micro Results Recent Results (from the past 240 hour(s))  Surgical pcr screen     Status: Abnormal   Collection Time: 03/20/16  2:47 AM  Result Value Ref Range Status   MRSA, PCR INVALID RESULTS, SPECIMEN SENT FOR CULTURE (A) NEGATIVE Final    Comment: NOTIFIED K. OSBOURNE RN AT 0715 ON 06.28.17 BY SHUEA   Staphylococcus aureus INVALID RESULTS, SPECIMEN SENT FOR CULTURE (A) NEGATIVE Final    Comment: NOTIFIED K. OSBOURNE RN AT 0715 ON 06.28.17 BY SHUEA        The Xpert SA Assay (FDA approved for NASAL specimens in patients over 56 years of age), is one component of a comprehensive surveillance program.  Test performance has been validated by [facility] for patients greater than or equal to 85 year old. It is not intended to diagnose infection nor to guide or monitor treatment.     Radiology Reports Ct  Head Wo Contrast  03/20/2016  CLINICAL DATA:  [place] going to get mail. Headache and pain. History of lung cancer, tremor, deep brain stimulator. EXAM: CT HEAD WITHOUT CONTRAST TECHNIQUE: Contiguous axial images were obtained from the base of the skull through the vertex without intravenous contrast. COMPARISON:  CT HEAD September 15, 2015 FINDINGS: INTRACRANIAL CONTENTS: Similar moderate ventriculomegaly on the basis of global parenchymal brain volume loss. Bilateral deep brain stimulator  leads terminate in the region of the thalamus. Stable gliosis along the catheter tract. Patchy supratentorial white matter hypodensities exclusive of the aforementioned abnormality. No intraparenchymal hemorrhage, mass effect, midline shift or acute large vascular territory infarcts. Basal cisterns are patent. Mild calcific atherosclerosis of the carotid siphon. ORBITS: The included ocular globes and orbital contents are non-suspicious. SINUSES: Mild RIGHT sphenoid sinusitis. Mastoid air cells are well aerated. SKULL/SOFT TISSUES: No skull fracture. No significant soft tissue swelling. Stimulators leads course in RIGHT neck. IMPRESSION: No acute intracranial process. DBS leads at the level of the bilateral thalamus. Moderate global parenchymal brain volume loss and moderate chronic small vessel ischemic disease. Electronically Signed   By: Elon Alas M.D.   On: 03/20/2016 01:42   Dg Chest Port 1 View  03/19/2016  CLINICAL DATA:  Right hip fracture EXAM: PORTABLE CHEST 1 VIEW COMPARISON:  None. FINDINGS: Curvilinear scarring in the left upper lung. No airspace consolidation. No large effusion. No pneumothorax. Normal pulmonary vasculature. Implanted stimulating device superimposes the right hemi thorax with leads extending up the right neck. IMPRESSION: Curvilinear scarring in the upper left lung, treatment related. No acute cardiopulmonary findings. Electronically Signed   By: Andreas Newport M.D.   On: 03/19/2016 22:59   Dg Hip Unilat With Pelvis 2-3 Views Right  03/19/2016  CLINICAL DATA:  75 year old who fell and injured the right hip. Initial encounter. EXAM: DG HIP (WITH OR WITHOUT PELVIS) 2-3V RIGHT COMPARISON:  None. FINDINGS: Acute comminuted, mildly displaced intertrochanteric right femoral neck fracture. Hip joint intact with well preserved joint space for age. Included AP pelvis demonstrates a normal-appearing contralateral left hip joint. Sacroiliac joints and symphysis pubis intact.  Osseous demineralization. IMPRESSION: Acute traumatic comminuted, mildly displaced intertrochanteric right femoral neck fracture. Electronically Signed   By: Evangeline Dakin M.D.   On: 03/19/2016 21:35    Time Spent in minutes  30   Korry Dalgleish K M.D on 03/20/2016 at 11:40 AM  Between 7am to 7pm - Pager - [phone]  After 7pm go to [email or web address] - password [facility]  [facility] -  Office  [phone]

## 2016-03-20 NOTE — H&P (Signed)
History and Physical    Sherri Singh EXH:[id number] DOB: 1940-10-01 DOA: 03/19/2016  Referring MD/NP/PA:   PCP: Lynne Leader, MD   Patient coming from:  The patient is coming from home.  At baseline, pt is independent for most of ADL.  Chief Complaint: right hip pain after fall  HPI: Sherri Singh is a 75 y.o. 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  Pt states that she lost her balance and fell when she was walking to get her mail at about 8: 00 PM today. She developed pain in right hip pain, but denies any head injury, neck pain or loss of consciousness. Her right hip pain is constant, severe, nonradiating. She does not have muscle spasm. No numbness in her right leg. She did not have dizziness, unilateral weakness or numbness in her extremities before fall. Patient has an increased urinary frequency, but no dysuria or burning on urination. No nausea, vomiting, diarrhea, abdominal pain, chest pain, cough, shortness of breath.   ED Course: pt was found to have positive UA for UTI, INR 1.1, WBC 7.9, temperature normal, slightly bradycardia, electrolytes and renal function okay. Chest x-ray showed scarring of the left upper lung where she had received treatment for cancer. X-ray showd acute comminuted mildly displaced intertrochanteric femoral neck fracture. Pt is admitted to [facility] bed as inpatient for further eval and treatment. Also appears surgery was consulted by EDP.   Review of Systems:   General: no fevers, chills, no changes in body weight, has fatigue HEENT: no blurry vision, hearing changes or sore throat. Pulm: no dyspnea, coughing, wheezing CV: no chest pain, no palpitations Abd: no nausea, vomiting, abdominal pain, diarrhea, constipation GU: no dysuria, burning on  urination, increased urinary frequency, hematuria  Ext: no leg edema. Has fine tremors in hands. Neuro: no unilateral weakness, numbness, or tingling, no vision change or hearing loss Skin: no rash MSK: has right hip pain Heme: No easy bruising.  Travel history: No recent long distant travel.  Allergy:  Allergies  Allergen Reactions  . Codeine Other (See Comments)  . Other Other (See Comments)    Peanuts, Causes headaches Allergic to a type of pain medication, unable to recall name of medication at this time  . Budesonide-Formoterol Fumarate     REACTION: hoarseness  . Iohexol      Code: HIVES, Desc: pt developed hives 30+ yrs ago; needs 13 hour prep in future per Dr Leonia Reeves; 06/26/09 slg   . Morphine Other (See Comments)    Unsure of Reaction  . Shellfish-Derived Products Other (See Comments)    Positive allergy test- has never had a reaction  . Iodine Rash    Boils on skin    Past Medical History  Diagnosis Date  . Lung cancer ([place])     lung ca dx 04/2009  . Benign essential tremor   . Lung cancer ([place])   . COPD (chronic obstructive pulmonary disease) [facility])     Past Surgical History  Procedure Laterality Date  . Deep brain stimulator placement    . Mouth surgery    . Breast surgery    . Appendectomy    . Ovary surgery      Social History:  reports that she quit smoking about 18 years ago. She does not have any smokeless tobacco history on file. She  reports that she does not drink alcohol or use illicit drugs.  Family History:  Family History  Problem Relation Age of Onset  . Cancer Mother     lung  . Heart attack Mother   . Diabetes Mother      Prior to Admission medications   Medication Sig Start Date End Date Taking? Authorizing Provider  AMBULATORY NON FORMULARY MEDICATION Compression stockings bilateral knee highs  for bilateral leg/ankle edema 01/20/15  Yes Jade L Breeback, PA-C  AMBULATORY NON FORMULARY MEDICATION 1 Device by Other route continuous.  four wheel rollator style walker Code: G25.0 01/29/16  Yes Gregor Hams, MD  budesonide-formoterol [facility]) 160-4.5 MCG/ACT inhaler Inhale 2 puffs into the lungs 2 (two) times daily. 11/07/15  Yes Gregor Hams, MD  Cholecalciferol (VITAMIN D PO) Take 1 tablet by mouth daily.    Yes Historical Provider, MD  metoprolol (LOPRESSOR) 50 MG tablet TAKE TWO TABLETS BY MOUTH TWICE DAILY 02/27/16  Yes Gregor Hams, MD  umeclidinium bromide (INCRUSE ELLIPTA) 62.5 MCG/INH AEPB Inhale 1 puff into the lungs daily. Patient taking differently: Inhale 1 puff into the lungs daily as needed (SOB, wheezing).  01/16/16  Yes Gregor Hams, MD  Zoster Vaccine Live, PF, (ZOSTAVAX) 44818 UNT/0.65ML injection Inject 19,400 Units into the skin once. If given in pharmacy fax report to Dr Georgina Snell [phone] Patient not taking: Reported on 03/19/2016 03/18/16   Gregor Hams, MD    Physical Exam: Filed Vitals:   03/19/16 2100 03/19/16 2253 03/19/16 2300 03/19/16 2330  BP: 156/92 139/56 137/57 167/64  Pulse: 84 73 69 81  Temp: 98.2 F (36.8 C)     TempSrc: Oral     Resp: '26 15 19 20  '$ Height: '5\' 4"'$  (1.626 m)     Weight: 48.535 kg (107 lb)     SpO2: 96% 99% 99% 100%   General: Not in acute distress HEENT:       Eyes: PERRL, EOMI, no scleral icterus.       ENT: No discharge from the ears and nose, no pharynx injection, no tonsillar enlargement.        Neck: No JVD, no bruit, no mass felt. Heme: No neck lymph node enlargement. Cardiac: S1/S2, RRR, No murmurs, No gallops or rubs. Pulm: Good air movement bilaterally. No rales, wheezing, rhonchi or rubs. Abd: Soft, nondistended, nontender, no rebound pain, no organomegaly, BS present. GU: No hematuria Ext: No pitting leg edema bilaterally. 2+DP/PT pulse bilaterally. Musculoskeletal: No joint deformities, No joint redness or warmth, no limitation of ROM in spin. Skin: No rashes.  Neuro: Alert, oriented X3, cranial nerves II-XII grossly intact, moves all extremities  normally. Muscle strength 5/5 in all extremities, sensation to light touch intact. Brachial reflex 2+ bilaterally. Knee reflex 1+ bilaterally. Negative Babinski's sign. Normal finger to nose test. Psych: Patient is not psychotic, no suicidal or hemocidal ideation.  Labs on Admission: I have personally reviewed following labs and imaging studies  CBC:  Recent Labs Lab 03/19/16 2139  WBC 7.9  NEUTROABS 5.8  HGB 13.0  HCT 38.4  MCV 89.5  PLT 378   Basic Metabolic Panel:  Recent Labs Lab 03/19/16 2139  NA 142  K 4.2  CL 108  CO2 26  GLUCOSE 106*  BUN 12  CREATININE 0.69  CALCIUM 10.1   GFR: Estimated Creatinine Clearance: 47.2 mL/min (by C-G formula based on Cr of 0.69). Liver Function Tests: No results for input(s): AST, ALT, ALKPHOS, BILITOT, PROT, ALBUMIN in the  last 168 hours. No results for input(s): LIPASE, AMYLASE in the last 168 hours. No results for input(s): AMMONIA in the last 168 hours. Coagulation Profile:  Recent Labs Lab 03/19/16 2139  INR 1.10   Cardiac Enzymes: No results for input(s): CKTOTAL, CKMB, CKMBINDEX, TROPONINI in the last 168 hours. BNP (last 3 results)  Recent Labs  04/26/15 0940  PROBNP 228.70*   HbA1C: No results for input(s): HGBA1C in the last 72 hours. CBG: No results for input(s): GLUCAP in the last 168 hours. Lipid Profile: No results for input(s): CHOL, HDL, LDLCALC, TRIG, CHOLHDL, LDLDIRECT in the last 72 hours. Thyroid Function Tests: No results for input(s): TSH, T4TOTAL, FREET4, T3FREE, THYROIDAB in the last 72 hours. Anemia Panel: No results for input(s): VITAMINB12, FOLATE, FERRITIN, TIBC, IRON, RETICCTPCT in the last 72 hours. Urine analysis:    Component Value Date/Time   COLORURINE YELLOW 03/19/2016 2159   APPEARANCEUR CLOUDY* 03/19/2016 2159   LABSPEC 1.016 03/19/2016 2159   PHURINE 6.0 03/19/2016 2159   GLUCOSEU NEGATIVE 03/19/2016 2159   HGBUR TRACE* 03/19/2016 2159   BILIRUBINUR NEGATIVE 03/19/2016  2159   KETONESUR NEGATIVE 03/19/2016 2159   PROTEINUR NEGATIVE 03/19/2016 2159   UROBILINOGEN 0.2 05/02/2012 0055   NITRITE POSITIVE* 03/19/2016 2159   LEUKOCYTESUR SMALL* 03/19/2016 2159   Sepsis Labs: '@LABRCNTIP'$ (procalcitonin:4,lacticidven:4) )No results found for this or any previous visit (from the past 240 hour(s)).   Radiological Exams on Admission: Dg Chest Port 1 View  03/19/2016  CLINICAL DATA:  Right hip fracture EXAM: PORTABLE CHEST 1 VIEW COMPARISON:  None. FINDINGS: Curvilinear scarring in the left upper lung. No airspace consolidation. No large effusion. No pneumothorax. Normal pulmonary vasculature. Implanted stimulating device superimposes the right hemi thorax with leads extending up the right neck. IMPRESSION: Curvilinear scarring in the upper left lung, treatment related. No acute cardiopulmonary findings. Electronically Signed   By: Andreas Newport M.D.   On: 03/19/2016 22:59   Dg Hip Unilat With Pelvis 2-3 Views Right  03/19/2016  CLINICAL DATA:  75 year old who fell and injured the right hip. Initial encounter. EXAM: DG HIP (WITH OR WITHOUT PELVIS) 2-3V RIGHT COMPARISON:  None. FINDINGS: Acute comminuted, mildly displaced intertrochanteric right femoral neck fracture. Hip joint intact with well preserved joint space for age. Included AP pelvis demonstrates a normal-appearing contralateral left hip joint. Sacroiliac joints and symphysis pubis intact. Osseous demineralization. IMPRESSION: Acute traumatic comminuted, mildly displaced intertrochanteric right femoral neck fracture. Electronically Signed   By: Evangeline Dakin M.D.   On: 03/19/2016 21:35     EKG: Independently reviewed. Poor quality due to essential tremor, has sinus rhythm, QTC 455, right bundle blockage  Assessment/Plan Principal Problem:   Fracture of femoral neck, right, closed Active Problems:   COPD (chronic obstructive pulmonary disease) with chronic bronchitis (HCC)   Essential tremor   Essential  hypertension, benign   Falls frequently   UTI (lower urinary tract infection)   Fracture of femoral neck, right, closed: As evidenced by x-ray. Patient has moderate pain now. No neurovascular compromise. Orthopedic surgeon was consulted. Dr. Berenice Primas is going to do surgery tomorrow.   - will admit to Med-surg bed as inpt - Pain control: prn percocet - When necessary Zofran for nausea - prn Robaxin for muscle spasm - Appreciated Dr. Berenice Primas consultation - type and cross - INR/PTT -Constitutional social work for possible rehabilitation facility placement after surgery  COPD: stable. -Dulera inhaler, Umeclidinium bromidinhaler  HTN: bp 167/64 -continue metoprolol -When necessary hydralazine  History of frequent fall: Likely  due to multiple comorbidities and the deconditioning -PT/OT -CT head now  UTI (lower urinary tract infection): Patient has positive urinalysis for UTI and increased urinary frequency, consistency with UTI. Patient is nonseptic. Hemodynamically stable. -IV Rocephin -Follow-up urine culture  Present caloric malnutrition-mild: -Consultation nutrition   DVT ppx: SCD Code Status: Full code (pt has DNR on problem list, but she wants to be on full code today). Family Communication: None at bed side.  Disposition Plan:  Anticipate discharge back to rehabilitation facility Consults called:  Ortho, Dr. Berenice Primas Admission status:   Inpatient/tele  Date of Service 03/20/2016    Ivor Costa [facility] Pager [phone]  If 7PM-7AM, please contact night-coverage [email or web address] Password [facility] 03/20/2016, 12:58 AM

## 2016-03-20 NOTE — Brief Op Note (Signed)
03/19/2016 - 03/20/2016  9:46 PM  PATIENT:  Sherri Singh  75 y.o. female  PRE-OPERATIVE DIAGNOSIS:  Right Fx Hip  POST-OPERATIVE DIAGNOSIS:  Right Fx Hip  PROCEDURE:  Procedure(s): INTRAMEDULLARY (IM) NAIL FEMORAL (Right)  SURGEON:  Surgeon(s) and Role:    * Dorna Leitz, MD - Primary  PHYSICIAN ASSISTANT:   ASSISTANTS: bethune   ANESTHESIA:   general  EBL:  Total I/O In: -  Out: 400 [Urine:400]  BLOOD ADMINISTERED:none  DRAINS: none   LOCAL MEDICATIONS USED:  NONE  SPECIMEN:  No Specimen  DISPOSITION OF SPECIMEN:  N/A  COUNTS:  YES  TOURNIQUET:  * No tourniquets in log *  DICTATION: .Other Dictation: Dictation Number [id number]  PLAN OF CARE: Admit to inpatient   PATIENT DISPOSITION:  PACU - hemodynamically stable.   Delay start of Pharmacological VTE agent (>24hrs) due to surgical blood loss or risk of bleeding: no

## 2016-03-20 NOTE — Anesthesia Postprocedure Evaluation (Signed)
Anesthesia Post Note  Patient: Sherri Singh  Procedure(s) Performed: Procedure(s) (LRB): INTRAMEDULLARY (IM) NAIL INTERTROCHANTRIC (Right)  Patient location during evaluation: PACU Anesthesia Type: General Level of consciousness: awake and alert Pain management: pain level controlled Vital Signs Assessment: post-procedure vital signs reviewed and stable Respiratory status: spontaneous breathing, nonlabored ventilation, respiratory function stable and patient connected to nasal cannula oxygen Cardiovascular status: blood pressure returned to baseline and stable Postop Assessment: no signs of nausea or vomiting Anesthetic complications: no    Last Vitals:  Filed Vitals:   03/20/16 1955 03/20/16 2000  BP: 115/51 107/75  Pulse: 79 73  Temp:  36.6 C  Resp: 15 14    Last Pain:  Filed Vitals:   03/20/16 2003  PainSc: Asleep                 Breyanna Valera,JAMES TERRILL

## 2016-03-21 ENCOUNTER — Inpatient Hospital Stay (HOSPITAL_COMMUNITY): Payer: Medicare Other

## 2016-03-21 ENCOUNTER — Encounter (HOSPITAL_COMMUNITY): Payer: Self-pay | Admitting: Orthopedic Surgery

## 2016-03-21 DIAGNOSIS — R0602 Shortness of breath: Secondary | ICD-10-CM

## 2016-03-21 DIAGNOSIS — S72001A Fracture of unspecified part of neck of right femur, initial encounter for closed fracture: Secondary | ICD-10-CM

## 2016-03-21 DIAGNOSIS — J449 Chronic obstructive pulmonary disease, unspecified: Secondary | ICD-10-CM

## 2016-03-21 DIAGNOSIS — I10 Essential (primary) hypertension: Secondary | ICD-10-CM

## 2016-03-21 LAB — CBC
HCT: 29.9 % — ABNORMAL LOW (ref 36.0–46.0)
Hemoglobin: 9.7 g/dL — ABNORMAL LOW (ref 12.0–15.0)
MCH: 30.2 pg (ref 26.0–34.0)
MCHC: 32.4 g/dL (ref 30.0–36.0)
MCV: 93.1 fL (ref 78.0–100.0)
PLATELETS: 132 10*3/uL — AB (ref 150–400)
RBC: 3.21 MIL/uL — ABNORMAL LOW (ref 3.87–5.11)
RDW: 13.2 % (ref 11.5–15.5)
WBC: 10.7 10*3/uL — ABNORMAL HIGH (ref 4.0–10.5)

## 2016-03-21 LAB — BASIC METABOLIC PANEL
Anion gap: 4 — ABNORMAL LOW (ref 5–15)
BUN: 9 mg/dL (ref 6–20)
CALCIUM: 9.3 mg/dL (ref 8.9–10.3)
CO2: 29 mmol/L (ref 22–32)
CREATININE: 0.72 mg/dL (ref 0.44–1.00)
Chloride: 104 mmol/L (ref 101–111)
GFR calc Af Amer: >60 mL/min (ref >60)
GLUCOSE: 118 mg/dL — AB (ref 65–99)
POTASSIUM: 4 mmol/L (ref 3.5–5.1)
SODIUM: 137 mmol/L (ref 135–145)

## 2016-03-21 MED ORDER — METHYLPREDNISOLONE SODIUM SUCC 125 MG IJ SOLR
40.0000 mg | Freq: Two times a day (BID) | INTRAMUSCULAR | Status: DC
  Administered 2016-03-21 – 2016-03-23 (×4): 40 mg via INTRAVENOUS
  Filled 2016-03-21 (×4): qty 2

## 2016-03-21 MED ORDER — IPRATROPIUM-ALBUTEROL 0.5-2.5 (3) MG/3ML IN SOLN: 3.0000 mL | Freq: Four times a day (QID) | RESPIRATORY_TRACT | Status: DC

## 2016-03-21 MED ORDER — BUDESONIDE 0.25 MG/2ML IN SUSP
0.2500 mg | Freq: Two times a day (BID) | RESPIRATORY_TRACT | Status: DC
  Administered 2016-03-21 – 2016-03-23 (×4): 0.25 mg via RESPIRATORY_TRACT
  Filled 2016-03-21 (×4): qty 2

## 2016-03-21 MED ORDER — IPRATROPIUM-ALBUTEROL 0.5-2.5 (3) MG/3ML IN SOLN: 3.0000 mL | RESPIRATORY_TRACT | Status: DC | PRN

## 2016-03-21 MED ORDER — FUROSEMIDE 10 MG/ML IJ SOLN
20.0000 mg | Freq: Once | INTRAMUSCULAR | Status: AC
  Administered 2016-03-21: 20 mg via INTRAVENOUS
  Filled 2016-03-21: qty 2

## 2016-03-21 MED ORDER — IPRATROPIUM-ALBUTEROL 0.5-2.5 (3) MG/3ML IN SOLN
3.0000 mL | Freq: Four times a day (QID) | RESPIRATORY_TRACT | Status: DC
  Administered 2016-03-21: 3 mL via RESPIRATORY_TRACT
  Filled 2016-03-21: qty 3

## 2016-03-21 NOTE — Clinical Social Work Note (Signed)
LCSW spoke with [facility] LCSWA who states patient's family requesting SNF placement at [facility].  This LCSW now assuming discharge planning care.   Lubertha Sayres, [facility]: [phone] Surgical: [phone]

## 2016-03-21 NOTE — Clinical Documentation Improvement (Signed)
Internal Medicine Orthopedic  Can the diagnosis of Malnutrition be further specified?   Document Severity - Severe(third degree), Moderate (second degree), Mild (first degree)  Other condition  Unable to clinically determine  Document any associated diagnoses/conditions Please update your documentation within the medical record to reflect your response to this query. Thank you.  Supporting Information:  Initial Nutrition Assessment : by Satira Anis Ward, RD at 03/20/2016 12:01 PM  DOCUMENTATION CODES:  Severe malnutrition in context of chronic illness  INTERVENTION:  -Ensure Enlive po TID, each supplement provides 350 kcal and 20 grams of protein w/ advancement  -Encourage PO intake  NUTRITION DIAGNOSIS:  Malnutrition related to chronic illness as evidenced by severe depletion of muscle mass, moderate depletion of body fat, moderate depletions of muscle mass, percent weight loss.   Please exercise your independent, professional judgment when responding. A specific answer is not anticipated or expected.  Thank You, Alessandra Grout, RN, BSN, CCDS,Clinical Documentation Specialist:  [phone] [place]- Health Information Management

## 2016-03-21 NOTE — Clinical Social Work Placement (Signed)
   CLINICAL SOCIAL WORK PLACEMENT  NOTE  Date:  03/21/2016  Patient Details  Name: Sherri Singh MRN: [id number] Date of Birth: December 17, 1940  Clinical Social Work is seeking post-discharge placement for this patient at the [place] level of care (*CSW will initial, date and re-position this form in  chart as items are completed):  Yes   Patient/family provided with [place] Work Department's list of facilities offering this level of care within the geographic area requested by the patient (or if unable, by the patient's family).  Yes   Patient/family informed of their freedom to choose among providers that offer the needed level of care, that participate in Medicare, Medicaid or managed care program needed by the patient, have an available bed and are willing to accept the patient.  Yes   Patient/family informed of [facility]'s ownership interest in [facility] and [facility], as well as of the fact that they are under no obligation to receive care at these facilities.  PASRR submitted to EDS on       PASRR number received on       Existing PASRR number confirmed on 03/21/16     FL2 transmitted to all facilities in geographic area requested by pt/family on 03/21/16     FL2 transmitted to all facilities within larger geographic area on       Patient informed that his/her managed care company has contracts with or will negotiate with certain facilities, including the following:        Yes   Patient/family informed of bed offers received.  Patient chooses bed at [facility]     Physician recommends and patient chooses bed at      Patient to be transferred to [facility] on  .  Patient to be transferred to facility by       Patient family notified on   of transfer.  Name of family member notified:        PHYSICIAN Please sign FL2     Additional Comment:    _______________________________________________ Caroline Sauger,  LCSW 03/21/2016, 11:35 AM

## 2016-03-21 NOTE — Progress Notes (Addendum)
Subjective: 1 Day Post-Op Procedure(s) (LRB): INTRAMEDULLARY (IM) NAIL FEMORAL (Right) Patient reports pain as mild. Patient is sitting in bed and appears comfortable. The patient was seen at 8:15 this a.m.  Objective: Vital signs in last 24 hours: Temp:  [97.5 F (36.4 C)-98.5 F (36.9 C)] 98.2 F (36.8 C) (06/29 0527) Pulse Rate:  [73-87] 87 (06/29 0527) Resp:  [13-28] 14 (06/29 0527) BP: (93-150)/(49-75) 107/55 mmHg (06/29 0527) SpO2:  [91 %-100 %] 91 % (06/29 0527)  Intake/Output from previous day: 06/28 0701 - 06/29 0700 In: 1000 [I.V.:800; IV Piggyback:200] Out: 850 [Urine:800; Blood:50] Intake/Output this shift:     Recent Labs  03/19/16 2139 03/21/16 0026  HGB 13.0 9.7*    Recent Labs  03/19/16 2139 03/21/16 0026  WBC 7.9 10.7*  RBC 4.29 3.21*  HCT 38.4 29.9*  PLT 190 132*    Recent Labs  03/19/16 2139 03/21/16 0003  NA 142 137  K 4.2 4.0  CL 108 104  CO2 26 29  BUN 12 9  CREATININE 0.69 0.72  GLUCOSE 106* 118*  CALCIUM 10.1 9.3    Recent Labs  03/19/16 2139  INR 1.10  right hip exam: Surgical dressings are benign.  No significant drainage.  Minimal right thigh swelling.  Right calf is soft and nontender.  Neurovascular status is intact distally.  She moves her foot actively.   Assessment/Plan: 1 Day Post-Op Procedure(s) (LRB): INTRAMEDULLARY (IM) NAIL FEMORAL (Right)  Acute blood loss anemia, expected. Plan: Up to chair with physical therapy.  May ambulate partial weightbearing 50% on the right.ambulation with physical therapy as tolerated. Aspirin 325 mg enteric-coated daily with SCD hose for DVT prophylaxis.  Would continue on aspirin 1 month postop. Continued medical care per hospitalist. We will need skilled nursing facility.   Kashena Novitski G 03/21/2016, 12:39 PM

## 2016-03-21 NOTE — Progress Notes (Signed)
PROGRESS NOTE    Sherri Singh  WFU:[id number] DOB: 02/16/1941 DOA: 03/19/2016 PCP: Lynne Leader, MD  Outpatient Specialists:   Brief Narrative: 75 y.o. 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 Pt states that she lost her balance and fell when she was walking to get her mail at about 8: 00 PM today. She developed pain in right hip pain, but denies any head injury, neck pain or loss of consciousness. Her right hip pain is constant, severe, nonradiating. She does not have muscle spasm. No numbness in her right leg. She did not have dizziness, unilateral weakness or numbness in her extremities before fall. Patient has an increased urinary frequency, but no dysuria or burning on urination. No nausea, vomiting, diarrhea, abdominal pain, chest pain, cough, shortness of breath.  Assessment & Plan:   Principal Problem:   Fracture of femoral neck, right, closed Active Problems:   COPD (chronic obstructive pulmonary disease) with chronic bronchitis (HCC)   Essential tremor   Essential hypertension, benign   Falls frequently   UTI (lower urinary tract infection)  Principal Problem:  Fracture of femoral neck, right, closed Active Problems:  COPD (chronic obstructive pulmonary disease) with chronic bronchitis (HCC)  Essential tremor  Essential hypertension, benign  Falls frequently  UTI (lower urinary tract infection)   Fracture of femoral neck, right, closed: S/P ORIF of right hip fracture.  COPD: Start Nebs Duoneb, pulmicort and IV steroids.   HTN: Optimize  UTI (lower urinary tract infection): Follow urine cultures. Continue antibiotics.    Present caloric malnutrition-mild: -Consultation nutrition   DVT ppx: SCD Code Status: Full code (pt has DNR on problem list, but she  wants to be on full code today). Family Communication: None at bed side.  Disposition Plan: Anticipate discharge back to rehabilitation facility Consults called: Ortho, Dr. Berenice Primas Admission status: Inpatient/tele  Subjective: No fever or chills. Slight SOB noted. No chest pain  Objective: Filed Vitals:   03/20/16 2013 03/20/16 2353 03/21/16 0527 03/21/16 1300  BP: 110/57 93/55 107/55 107/42  Pulse: 85 86 87 81  Temp: 98.4 F (36.9 C) 97.5 F (36.4 C) 98.2 F (36.8 C) 97.6 F (36.4 C)  TempSrc: Oral Oral Oral Oral  Resp: '14 14 14 16  '$ Height:      Weight:      SpO2: 96% 98% 91% 100%    Intake/Output Summary (Last 24 hours) at 03/21/16 1957 Last data filed at 03/21/16 0640  Gross per 24 hour  Intake    200 ml  Output    450 ml  Net   -250 ml   Filed Weights   03/19/16 2100 03/20/16 0240  Weight: 48.535 kg (107 lb) 48.5 kg (106 lb 14.8 oz)    Examination:  General exam: Slight SOB. Respiratory system: Decreased air entry.   Cardiovascular system: S1 & S2 heard, RRR. Notable neck veins Gastrointestinal system: Abdomen is nondistended, soft and nontender.  . Central nervous system: Alert. Moves all extremities. Extremities: No edema.    Data Reviewed: I have personally reviewed following labs and imaging studies  CBC:  Recent Labs Lab 03/19/16 2139 03/21/16 0026  WBC 7.9 10.7*  NEUTROABS 5.8  --   HGB 13.0 9.7*  HCT 38.4 29.9*  MCV 89.5 93.1  PLT 190 202*   Basic Metabolic Panel:  Recent Labs Lab 03/19/16 2139  03/21/16 0003  NA 142 137  K 4.2 4.0  CL 108 104  CO2 26 29  GLUCOSE 106* 118*  BUN 12 9  CREATININE 0.69 0.72  CALCIUM 10.1 9.3   GFR: Estimated Creatinine Clearance: 47.2 mL/min (by C-G formula based on Cr of 0.72). Liver Function Tests: No results for input(s): AST, ALT, ALKPHOS, BILITOT, PROT, ALBUMIN in the last 168 hours. No results for input(s): LIPASE, AMYLASE in the last 168 hours. No results for input(s): AMMONIA in the  last 168 hours. Coagulation Profile:  Recent Labs Lab 03/19/16 2139  INR 1.10   Cardiac Enzymes: No results for input(s): CKTOTAL, CKMB, CKMBINDEX, TROPONINI in the last 168 hours. BNP (last 3 results)  Recent Labs  04/26/15 0940  PROBNP 228.70*   HbA1C: No results for input(s): HGBA1C in the last 72 hours. CBG: No results for input(s): GLUCAP in the last 168 hours. Lipid Profile: No results for input(s): CHOL, HDL, LDLCALC, TRIG, CHOLHDL, LDLDIRECT in the last 72 hours. Thyroid Function Tests: No results for input(s): TSH, T4TOTAL, FREET4, T3FREE, THYROIDAB in the last 72 hours. Anemia Panel: No results for input(s): VITAMINB12, FOLATE, FERRITIN, TIBC, IRON, RETICCTPCT in the last 72 hours. Urine analysis:    Component Value Date/Time   COLORURINE YELLOW 03/19/2016 2159   APPEARANCEUR CLOUDY* 03/19/2016 2159   LABSPEC 1.016 03/19/2016 2159   PHURINE 6.0 03/19/2016 2159   GLUCOSEU NEGATIVE 03/19/2016 2159   HGBUR TRACE* 03/19/2016 2159   BILIRUBINUR NEGATIVE 03/19/2016 2159   KETONESUR NEGATIVE 03/19/2016 2159   PROTEINUR NEGATIVE 03/19/2016 2159   UROBILINOGEN 0.2 05/02/2012 0055   NITRITE POSITIVE* 03/19/2016 2159   LEUKOCYTESUR SMALL* 03/19/2016 2159   Sepsis Labs: '@LABRCNTIP'$ (procalcitonin:4,lacticidven:4)  ) Recent Results (from the past 240 hour(s))  Urine culture     Status: Abnormal (Preliminary result)   Collection Time: 03/19/16  9:59 PM  Result Value Ref Range Status   Specimen Description URINE, CATHETERIZED  Final   Special Requests NONE  Final   Culture >=100,000 COLONIES/mL ESCHERICHIA COLI (A)  Final   Report Status PENDING  Incomplete  Surgical pcr screen     Status: Abnormal   Collection Time: 03/20/16  2:47 AM  Result Value Ref Range Status   MRSA, PCR INVALID RESULTS, SPECIMEN SENT FOR CULTURE (A) NEGATIVE Final    Comment: NOTIFIED K. OSBOURNE RN AT 0715 ON 06.28.17 BY SHUEA   Staphylococcus aureus INVALID RESULTS, SPECIMEN SENT FOR  CULTURE (A) NEGATIVE Final    Comment: NOTIFIED K. OSBOURNE RN AT 0715 ON 06.28.17 BY SHUEA        The Xpert SA Assay (FDA approved for NASAL specimens in patients over 13 years of age), is one component of a comprehensive surveillance program.  Test performance has been validated by [facility] for patients greater than or equal to 27 year old. It is not intended to diagnose infection nor to guide or monitor treatment.   MRSA culture     Status: None (Preliminary result)   Collection Time: 03/20/16  2:47 AM  Result Value Ref Range Status   Specimen Description NOSE  Final   Special Requests NONE  Final   Culture   Final    CULTURE REINCUBATED FOR BETTER GROWTH Performed at [facility]    Report Status PENDING  Incomplete         Radiology Studies: Dg Chest 2 View  03/21/2016  CLINICAL DATA:  Heart failure EXAM: CHEST  2 VIEW COMPARISON:  03/19/2016 FINDINGS: Cardiac shadow is  stable. Scarring is again noted in the left upper lung. AE stimulator pack is noted over the right chest wall. The lungs are mildly hyperinflated. No acute bony abnormality is seen. 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  No acute abnormality noted. Electronically Signed   By: Inez Catalina M.D.   On: 03/21/2016 12:33   Ct Head Wo Contrast  03/20/2016  CLINICAL DATA:  [place] going to get mail. Headache and pain. History of lung cancer, tremor, deep brain stimulator. EXAM: CT HEAD WITHOUT CONTRAST TECHNIQUE: Contiguous axial images were obtained from the base of the skull through the vertex without intravenous contrast. COMPARISON:  CT HEAD September 15, 2015 FINDINGS: INTRACRANIAL CONTENTS: Similar moderate ventriculomegaly on the basis of global parenchymal brain volume loss. Bilateral deep brain stimulator leads terminate in the region of the thalamus. Stable gliosis along the catheter tract. Patchy supratentorial white matter hypodensities exclusive of the aforementioned abnormality. No  intraparenchymal hemorrhage, mass effect, midline shift or acute large vascular territory infarcts. Basal cisterns are patent. Mild calcific atherosclerosis of the carotid siphon. ORBITS: The included ocular globes and orbital contents are non-suspicious. SINUSES: Mild RIGHT sphenoid sinusitis. Mastoid air cells are well aerated. SKULL/SOFT TISSUES: No skull fracture. No significant soft tissue swelling. Stimulators leads course in RIGHT neck. IMPRESSION: No acute intracranial process. DBS leads at the level of the bilateral thalamus. Moderate global parenchymal brain volume loss and moderate chronic small vessel ischemic disease. Electronically Signed   By: Elon Alas M.D.   On: 03/20/2016 01:42   Dg Chest Port 1 View  03/19/2016  CLINICAL DATA:  Right hip fracture EXAM: PORTABLE CHEST 1 VIEW COMPARISON:  None. FINDINGS: Curvilinear scarring in the left upper lung. No airspace consolidation. No large effusion. No pneumothorax. Normal pulmonary vasculature. Implanted stimulating device superimposes the right hemi thorax with leads extending up the right neck. IMPRESSION: Curvilinear scarring in the upper left lung, treatment related. No acute cardiopulmonary findings. Electronically Signed   By: Andreas Newport M.D.   On: 03/19/2016 22:59   Dg C-arm 1-60 Min  03/20/2016  CLINICAL DATA:  Right femur intra medullary nail. EXAM: DG C-ARM 61-120 MIN; RIGHT FEMUR 2 VIEWS COMPARISON:  Radiography from yesterday FINDINGS: Fluoroscopy shows intra medullary nail fixation of an intertrochanteric right femur fracture. No evidence of intraoperative fracture. Located hip. IMPRESSION: Fluoroscopy for intertrochanteric femur fracture fixation. Electronically Signed   By: Monte Fantasia M.D.   On: 03/20/2016 18:18   Dg Hip Unilat With Pelvis 2-3 Views Right  03/19/2016  CLINICAL DATA:  75 year old who fell and injured the right hip. Initial encounter. EXAM: DG HIP (WITH OR WITHOUT PELVIS) 2-3V RIGHT COMPARISON:   None. FINDINGS: Acute comminuted, mildly displaced intertrochanteric right femoral neck fracture. Hip joint intact with well preserved joint space for age. Included AP pelvis demonstrates a normal-appearing contralateral left hip joint. Sacroiliac joints and symphysis pubis intact. Osseous demineralization. IMPRESSION: Acute traumatic comminuted, mildly displaced intertrochanteric right femoral neck fracture. Electronically Signed   By: Evangeline Dakin M.D.   On: 03/19/2016 21:35   Dg Femur, Min 2 Views Right  03/20/2016  CLINICAL DATA:  Right femur intra medullary nail. EXAM: DG C-ARM 61-120 MIN; RIGHT FEMUR 2 VIEWS COMPARISON:  Radiography from yesterday FINDINGS: Fluoroscopy shows intra medullary nail fixation of an intertrochanteric right femur fracture. No evidence of intraoperative fracture. Located hip. IMPRESSION: Fluoroscopy for intertrochanteric femur fracture fixation. Electronically Signed   By: Monte Fantasia M.D.   On: 03/20/2016 18:18  Scheduled Meds: . aspirin EC  325 mg Oral Q breakfast  . budesonide (PULMICORT) nebulizer solution  0.25 mg Nebulization BID  . cefTRIAXone (ROCEPHIN)  IV  1 g Intravenous QHS  . cholecalciferol  1,000 Units Oral Daily  . docusate sodium  100 mg Oral BID  . ferrous sulfate  325 mg Oral BID WC  . ipratropium-albuterol  3 mL Nebulization Q6H  . methylPREDNISolone (SOLU-MEDROL) injection  40 mg Intravenous Q12H  . metoprolol  100 mg Oral BID  . mometasone-formoterol  2 puff Inhalation BID   Continuous Infusions: . lactated ringers 10 mL/hr at 03/20/16 1348     LOS: 1 day    Time spent: 25 Minutes    Dana Allan, MD  [facility] Pager #: [phone] 7PM-7AM contact night coverage as above

## 2016-03-21 NOTE — Clinical Social Work Note (Signed)
PASARR: [phone] Sherri Singh, [place] [facility]: [phone] Surgical: [phone]

## 2016-03-21 NOTE — Clinical Social Work Note (Signed)
Clinical Social Work Assessment  Patient Details  Name: Sherri Singh MRN: [id number] Date of Birth: 1941/06/24  Date of referral:  03/20/16               Reason for consult:  Facility Placement                Permission sought to share information with:  Case Manager Permission granted to share information::     Name::        Agency::     Relationship::     Contact Information:     Housing/Transportation Living arrangements for the past 2 months:  Single Family Home Source of Information:  Adult Children Patient Interpreter Needed:  None Criminal Activity/Legal Involvement Pertinent to Current Situation/Hospitalization:  No - Comment as needed Significant Relationships:  Adult Children Lives with:  Self Do you feel safe going back to the place where you live?  No Need for family participation in patient care:  Yes (Comment)  Care giving concerns:  Patient Sons expressed concern with the wellbeing of their mother living at home alone. Patient was admitted after falling down walking to mailbox. Patient baseline is walking with cane but patient does not always use it. Patient family states patient was admitted in a SNF last December for a fall as well.    Social Worker assessment / plan:  LCSWA briefly met with family and answered questions regarding the SNF disposition post op. The family shared concerns about patient living alone and injuring self due to falls. The family reported they have been in contact with Sherri Singh over at [facility] to get her placed.  LCSWA educated family about following up with CSW at [facility]. Patient quiet, but shook her ahead to agree with SNF.   Employment status:  Retired Nurse, adult PT Recommendations:  Not assessed at this time Information / Referral to community resources:  [place]  Patient/Family's Response to care: Agreeable  Patient/Family's Understanding of and Emotional Response to Diagnosis,  Current Treatment, and Prognosis:  Family is very involved with patient care.  They are concerned about her safety at home alone and are actively seeking to get her placed at [facility].  Emotional Assessment Appearance:  Appears stated age Attitude/Demeanor/Rapport:   (Cooperative) Affect (typically observed):  Accepting, Calm Orientation:  Oriented to Self, Oriented to Place, Oriented to Situation Alcohol / Substance use:  Not Applicable Psych involvement (Current and /or in the community):  No (Comment)  Discharge Needs  Concerns to be addressed:    Readmission within the last 30 days:  No Current discharge risk:  None Barriers to Discharge:  No Barriers Identified   Lia Hopping, LCSW 03/21/2016, 11:05 AM

## 2016-03-21 NOTE — Care Management Important Message (Signed)
Important Message  Patient Details  Name: Sherri Singh MRN: [id number] Date of Birth: Dec 11, 1940   Medicare Important Message Given:  Yes    Loann Quill 03/21/2016, 9:56 AM

## 2016-03-21 NOTE — Evaluation (Signed)
Physical Therapy Evaluation Patient Details Name: Sherri Singh MRN: [id number] DOB: May 30, 1941 Today's Date: 03/21/2016   History of Present Illness  75 y.o. female who fell and sustained Rt femur fx and now s/p Rt ORIF with IM nail. PMH: deepbrain stimulator, lung cancer, benign essential tremor, COPD.   Clinical Impression  Pt moving slowly during initial evaluation and treatment. Mod/max assist needed for bed mobility and transfers. Based upon the patient's current mobility, recommending SNF for further rehabilitation following acute stay.    Follow Up Recommendations SNF;Supervision/Assistance - 24 hour    Equipment Recommendations  None recommended by PT (to be addressed at next venue)    Recommendations for Other Services       Precautions / Restrictions Precautions Precautions: Fall Restrictions Weight Bearing Restrictions: Yes RLE Weight Bearing: Partial weight bearing RLE Partial Weight Bearing Percentage or Pounds: 50%      Mobility  Bed Mobility Overal bed mobility: Needs Assistance Bed Mobility: Supine to Sit     Supine to sit: Max assist     General bed mobility comments: cues needed throughout session for sequence  Transfers Overall transfer level: Needs assistance Equipment used: Rolling walker (2 wheeled) Transfers: Sit to/from Stand Sit to Stand: Mod assist Stand pivot transfers: Max assist       General transfer comment: pt minimally weightbearing through Rt LE during transfer. Extensive assistance needed for balance, movement of rw.   Ambulation/Gait             General Gait Details: not attempted at this time due to safety  Stairs            Wheelchair Mobility    Modified Rankin (Stroke Patients Only)       Balance Overall balance assessment: Needs assistance Sitting-balance support: No upper extremity supported Sitting balance-Leahy Scale: Fair     Standing balance support: Bilateral upper extremity  supported Standing balance-Leahy Scale: Poor Standing balance comment: using rw and physical assist                             Pertinent Vitals/Pain Pain Assessment: 0-10 Pain Score: 7  Pain Location: Rt hip Pain Descriptors / Indicators: Aching Pain Intervention(s): Limited activity within patient's tolerance;Monitored during session    Home Living Family/patient expects to be discharged to:: Skilled nursing facility Living Arrangements: Alone               Additional Comments: previously living at independent living facility    Prior Function Level of Independence: Independent with assistive device(s)         Comments: report using rollator for ambulation, assist with meals but dressing self.      Hand Dominance        Extremity/Trunk Assessment   Upper Extremity Assessment: Generalized weakness           Lower Extremity Assessment: Generalized weakness;RLE deficits/detail RLE Deficits / Details: assist needed to move Rt LE to EOB    Cervical / Trunk Assessment: Kyphotic  Communication   Communication: Expressive difficulties (delayed response, dysarthric)  Cognition Arousal/Alertness: Awake/alert Behavior During Therapy: WFL for tasks assessed/performed Overall Cognitive Status: History of cognitive impairments - at baseline (family reports history of cognitive impairment)                      General Comments General comments (skin integrity, edema, etc.): SpO2 above 92% throughout session, on 2L O2 throughout.  Exercises        Assessment/Plan    PT Assessment Patient needs continued PT services  PT Diagnosis Difficulty walking   PT Problem List Decreased strength;Decreased range of motion;Decreased activity tolerance;Decreased balance;Decreased mobility  PT Treatment Interventions DME instruction;Gait training;Functional mobility training;Therapeutic activities;Therapeutic exercise;Patient/family education   PT  Goals (Current goals can be found in the Care Plan section) Acute Rehab PT Goals Patient Stated Goal: not expressed PT Goal Formulation: With patient Time For Goal Achievement: 04/04/16 Potential to Achieve Goals: Fair    Frequency Min 3X/week   Barriers to discharge        Co-evaluation               End of Session Equipment Utilized During Treatment: Gait belt;Oxygen Activity Tolerance: Patient tolerated treatment well Patient left: in chair;with call bell/phone within reach;with chair alarm set;with family/visitor present Nurse Communication: Mobility status;Need for lift equipment;Weight bearing status         Time: 0867-6195 PT Time Calculation (min) (ACUTE ONLY): 37 min   Charges:   PT Evaluation $PT Eval Moderate Complexity: 1 Procedure PT Treatments $Therapeutic Activity: 8-22 mins   PT G Codes:        Cassell Clement, PT, CSCS Pager [phone] Office [phone]  03/21/2016, 10:58 AM

## 2016-03-21 NOTE — NC FL2 (Signed)
[place] LEVEL OF CARE SCREENING TOOL     IDENTIFICATION  Patient Name: Sherri Singh Birthdate: [birth year] Sex: female Admission Date (Current Location): 03/19/2016  [facility] and [place] Number:  Herbalist and Address:  [facility] [place] [place] [address] [place], [place] [address]      Provider Number: [id number]  Attending Physician Name and Address:  Bonnell Public, MD  Relative Name and Phone Number:       Current Level of Care: Hospital Recommended Level of Care: [place] Prior Approval Number:    Date Approved/Denied:   PASRR Number:    Discharge Plan: SNF    Current Diagnoses: Patient Active Problem List   Diagnosis Date Noted  . UTI (lower urinary tract infection) 03/20/2016  . Fracture of femoral neck, right, closed 03/20/2016  . Fall   . Protein-calorie malnutrition ([place]) 01/16/2016  . AI (aortic incompetence) 10/18/2015  . DNR (do not resuscitate) 09/14/2015  . Falls frequently 09/13/2015  . Vaginal irritation 07/18/2015  . Aortic valve regurgitation 05/15/2015  . Essential hypertension, benign 01/27/2015  . Essential tremor 01/22/2015  . Tachycardia 01/20/2015  . Bilateral leg edema 01/20/2015  . TOBACCO ABUSE 05/11/2009  . CARCINOMA, LUNG, SMALL CELL 04/27/2009  . COPD (chronic obstructive pulmonary disease) with chronic bronchitis ([place]) 04/27/2009  . 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    Orientation RESPIRATION BLADDER Height & Weight     Self, Time, Situation, Place  O2 Continent Weight: 106 lb 14.8 oz (48.5 kg) Height:  '5\' 4"'$  (162.6 cm)  BEHAVIORAL SYMPTOMS/MOOD NEUROLOGICAL BOWEL NUTRITION STATUS      Continent Diet (Please see discharge summary.)  AMBULATORY STATUS COMMUNICATION OF NEEDS Skin    (Per PT patient not safe at this time.) Verbally Surgical wounds                       Personal Care Assistance Level of Assistance  Bathing, Feeding, Dressing Bathing  Assistance: Limited assistance Feeding assistance: Independent Dressing Assistance: Limited assistance     Functional Limitations Info             SPECIAL CARE FACTORS FREQUENCY  PT (By licensed PT), OT (By licensed OT)     PT Frequency: 5 OT Frequency: 5            Contractures      Additional Factors Info  Code Status, Allergies Code Status Info: FULL Allergies Info: Codeine, Other, Budesonide-formoterol Fumarate, Iohexol, Morphine, Shellfish-derived Products, Iodine           Current Medications (03/21/2016):  This is the current hospital active medication list Current Facility-Administered Medications  Medication Dose Route Frequency Provider Last Rate Last Dose  . acetaminophen (TYLENOL) tablet 650 mg  650 mg Oral Q6H PRN Gary Fleet, PA-C   650 mg at 03/20/16 2205   Or  . acetaminophen (TYLENOL) suppository 650 mg  650 mg Rectal Q6H PRN Gary Fleet, PA-C      . aspirin EC tablet 325 mg  325 mg Oral Q breakfast Gary Fleet, PA-C   325 mg at 03/21/16 8299  . cefTRIAXone (ROCEPHIN) 1 g in dextrose 5 % 50 mL IVPB  1 g Intravenous QHS Ivor Costa, MD 100 mL/hr at 03/20/16 2223 1 g at 03/20/16 2223  . cholecalciferol (VITAMIN D) tablet 1,000 Units  1,000 Units Oral Daily Ivor Costa, MD   1,000 Units at 03/21/16 [phone]  . docusate sodium (COLACE) capsule 100 mg  100 mg Oral BID Gary Fleet, PA-C   100 mg at 03/21/16 3810  . ferrous sulfate tablet 325 mg  325 mg Oral BID WC Gary Fleet, PA-C   325 mg at 03/21/16 1751  . hydrALAZINE (APRESOLINE) injection 5 mg  5 mg Intravenous Q2H PRN Ivor Costa, MD      . HYDROcodone-acetaminophen (NORCO/VICODIN) 5-325 MG per tablet 1-2 tablet  1-2 tablet Oral Q6H PRN Gary Fleet, PA-C      . lactated ringers infusion   Intravenous Continuous Jillyn Hidden, MD 10 mL/hr at 03/20/16 1348    . methocarbamol (ROBAXIN) tablet 500 mg  500 mg Oral Q8H PRN Ivor Costa, MD      . metoprolol (LOPRESSOR) injection 5 mg  5 mg Intravenous Q4H PRN  Thurnell Lose, MD      . metoprolol tartrate (LOPRESSOR) tablet 100 mg  100 mg Oral BID Ivor Costa, MD   100 mg at 03/21/16 0258  . mometasone-formoterol (DULERA) 200-5 MCG/ACT inhaler 2 puff  2 puff Inhalation BID Ivor Costa, MD   2 puff at 03/20/16 0853  . morphine 2 MG/ML injection 1 mg  1 mg Intravenous Q4H PRN Thurnell Lose, MD      . ondansetron (ZOFRAN) injection 4 mg  4 mg Intravenous Q8H PRN Ivor Costa, MD      . polyethylene glycol (MIRALAX / GLYCOLAX) packet 17 g  17 g Oral Daily PRN Gary Fleet, PA-C      . senna-docusate (Senokot-S) tablet 1 tablet  1 tablet Oral QHS PRN Ivor Costa, MD      . umeclidinium bromide (INCRUSE ELLIPTA) 62.5 MCG/INH 1 puff  1 puff Inhalation Daily PRN Ivor Costa, MD         Discharge Medications: Please see discharge summary for a list of discharge medications.  Relevant Imaging Results:  Relevant Lab Results:   Additional Information SSN: [id number]  Caroline Sauger, LCSW

## 2016-03-21 NOTE — Op Note (Signed)
NAMEAVEREE, HARB NO.:  [id number]  MEDICAL RECORD NO.:  [id number]  LOCATION:  5N11C                        FACILITY:  [facility]  PHYSICIAN:  Alta Corning, M.D.   DATE OF BIRTH:  1941-04-08  DATE OF PROCEDURE:  03/20/2016 DATE OF DISCHARGE:                              OPERATIVE REPORT   PREOPERATIVE DIAGNOSIS:  Intertrochanteric hip fracture, right.  POSTOPERATIVE DIAGNOSIS:  Intertrochanteric hip fracture, right.  PRINCIPAL PROCEDURE: 1. Open reduction and internal fixation of right intertrochanteric hip fracture with a Biomet trochanteric nail, 11 x 360 mm with a 95 mm compression screw and a single distal interlock. 2.interpretation of multiple intraoperative fluoroscopic images SURGEON:  Alta Corning, M.D.  ASSISTANT:  Gary Fleet, P.A.  ANESTHESIA:  General.  BRIEF HISTORY:  Mrs. Giraldo is a 75 year old female, who suffered an intertrochanteric hip fracture at home.  She was brought to the emergency room at [facility] where x-ray showed that she had an intertrochanteric hip fracture.  After discussion with the family and the patient, we felt that open reduction and internal fixation with intramedullary rod was most appropriate course of action.  She was admitted and cleared by the hospitalist, and she was ultimately transferred to [facility] because they could not get into the operating room until late at night, and she was transferred and taken to the operating room when available for intramedullary rod fixation.  DESCRIPTION OF PROCEDURE:  The patient was taken to the operating room. After adequate anesthesia was obtained with general anesthetic, the patient was placed supine on the operating table.  She was moved onto the fracture table.  She was put in position with the hip manually reduced and then put in traction and the right hip was prepped and draped in the usual sterile fashion.  Following this, an incision was made just  proximal to the trochanter.  An entry with a guidewire was made in the tip of the trochanter down the central portion of the femoral canal.  This was over-reamed with an introductory reamer.  A guidewire was then placed down the knee, and the rod was measured.  She was reamed to 13 and 11 x 360 mm rod was opened and put in place.  A compression hip screw was put across the fracture, 95 mm and used a compression technique, low and central in the head and once this was locked in place the attention was turned distally where a freehand interlock was made to the distal oblong hole. Once this was done, the wounds were all irrigated and suctioned dry, closed in layers.  Sterile compressive dressing was applied.  The patient was taken to the recovery room, she was noted to be in satisfactory condition.  Estimated blood loss for the procedure was probably 300 mL and the final can be gotten from her anesthetic record.     Alta Corning, M.D.     Corliss Skains  D:  03/20/2016  T:  03/21/2016  Job:  [id number]

## 2016-03-21 NOTE — Progress Notes (Signed)
OT Cancellation Note  Patient Details Name: Sherri Singh MRN: [id number] DOB: [birth year]   Cancelled Treatment:    Reason Eval/Treat Not Completed:  Pt with plans to discharge to SNF. Will defer OT to SNF. Please call [phone] if an OT assessment is needed prior to discharge.   Malka So 03/21/2016, 1:41 PM  [phone]

## 2016-03-22 DIAGNOSIS — D62 Acute posthemorrhagic anemia: Secondary | ICD-10-CM

## 2016-03-22 DIAGNOSIS — E440 Moderate protein-calorie malnutrition: Secondary | ICD-10-CM

## 2016-03-22 DIAGNOSIS — J441 Chronic obstructive pulmonary disease with (acute) exacerbation: Secondary | ICD-10-CM

## 2016-03-22 LAB — CBC
HCT: 23.8 % — ABNORMAL LOW (ref 36.0–46.0)
Hemoglobin: 7.7 g/dL — ABNORMAL LOW (ref 12.0–15.0)
MCH: 30.1 pg (ref 26.0–34.0)
MCHC: 32.4 g/dL (ref 30.0–36.0)
MCV: 93 fL (ref 78.0–100.0)
PLATELETS: 94 10*3/uL — AB (ref 150–400)
RBC: 2.56 MIL/uL — ABNORMAL LOW (ref 3.87–5.11)
RDW: 13.3 % (ref 11.5–15.5)
WBC: 5.7 10*3/uL (ref 4.0–10.5)

## 2016-03-22 LAB — CBC WITH DIFFERENTIAL/PLATELET
Basophils Absolute: 0 10*3/uL (ref 0.0–0.1)
Basophils Relative: 0 %
Eosinophils Absolute: 0 10*3/uL (ref 0.0–0.7)
Eosinophils Relative: 0 %
HCT: 27.1 % — ABNORMAL LOW (ref 36.0–46.0)
Hemoglobin: 8.7 g/dL — ABNORMAL LOW (ref 12.0–15.0)
Lymphocytes Relative: 8 %
Lymphs Abs: 0.5 10*3/uL — ABNORMAL LOW (ref 0.7–4.0)
MCH: 29.8 pg (ref 26.0–34.0)
MCHC: 32.1 g/dL (ref 30.0–36.0)
MCV: 92.8 fL (ref 78.0–100.0)
Monocytes Absolute: 0.2 10*3/uL (ref 0.1–1.0)
Monocytes Relative: 3 %
Neutro Abs: 6.1 10*3/uL (ref 1.7–7.7)
Neutrophils Relative %: 89 %
Platelets: 111 10*3/uL — ABNORMAL LOW (ref 150–400)
RBC: 2.92 MIL/uL — ABNORMAL LOW (ref 3.87–5.11)
RDW: 13.5 % (ref 11.5–15.5)
WBC: 6.8 10*3/uL (ref 4.0–10.5)

## 2016-03-22 LAB — HEMOGLOBIN AND HEMATOCRIT, BLOOD
HCT: 35 % — ABNORMAL LOW (ref 36.0–46.0)
Hemoglobin: 11.6 g/dL — ABNORMAL LOW (ref 12.0–15.0)

## 2016-03-22 LAB — URINE CULTURE: Culture: >=100000 — AB

## 2016-03-22 LAB — RENAL FUNCTION PANEL
Albumin: 2.6 g/dL — ABNORMAL LOW (ref 3.5–5.0)
Anion gap: 9 (ref 5–15)
BUN: 12 mg/dL (ref 6–20)
CO2: 28 mmol/L (ref 22–32)
Calcium: 9.8 mg/dL (ref 8.9–10.3)
Chloride: 102 mmol/L (ref 101–111)
Creatinine, Ser: 0.77 mg/dL (ref 0.44–1.00)
GFR calc Af Amer: >60 mL/min (ref >60)
GFR calc non Af Amer: >60 mL/min (ref >60)
Glucose, Bld: 161 mg/dL — ABNORMAL HIGH (ref 65–99)
Phosphorus: 2.6 mg/dL (ref 2.5–4.6)
Potassium: 3.7 mmol/L (ref 3.5–5.1)
Sodium: 139 mmol/L (ref 135–145)

## 2016-03-22 LAB — MRSA CULTURE

## 2016-03-22 LAB — PREPARE RBC (CROSSMATCH)

## 2016-03-22 MED ORDER — FUROSEMIDE 10 MG/ML IJ SOLN
20.0000 mg | Freq: Once | INTRAMUSCULAR | Status: AC
  Administered 2016-03-22: 20 mg via INTRAVENOUS
  Filled 2016-03-22: qty 2

## 2016-03-22 MED ORDER — IPRATROPIUM-ALBUTEROL 0.5-2.5 (3) MG/3ML IN SOLN
3.0000 mL | Freq: Three times a day (TID) | RESPIRATORY_TRACT | Status: DC
  Administered 2016-03-22 – 2016-03-23 (×5): 3 mL via RESPIRATORY_TRACT
  Filled 2016-03-22 (×5): qty 3

## 2016-03-22 MED ORDER — SODIUM CHLORIDE 0.9 % IV SOLN
Freq: Once | INTRAVENOUS | Status: AC
Start: 2016-03-22 — End: 2016-03-22
  Administered 2016-03-22: 11:00:00 via INTRAVENOUS

## 2016-03-22 NOTE — Progress Notes (Signed)
Physical Therapy Treatment Patient Details Name: Sherri Singh MRN: [id number] DOB: 06-21-1941 Today's Date: 03/22/2016    History of Present Illness 75 y.o. female who fell and sustained Rt femur fx and now s/p Rt ORIF with IM nail. PMH: deepbrain stimulator, lung cancer, benign essential tremor, COPD.     PT Comments    Pt performed therapeutic exercise in supine position in bed secondary to blood transfusion.  Pt received 1 unit of blood and waiting on 2nd unit.  Will progress mobility next visit.    Follow Up Recommendations  SNF;Supervision/Assistance - 24 hour     Equipment Recommendations  None recommended by PT (to be addressed at next venue of care.  )    Recommendations for Other Services       Precautions / Restrictions Precautions Precautions: Fall Restrictions Weight Bearing Restrictions: Yes RLE Weight Bearing: Partial weight bearing RLE Partial Weight Bearing Percentage or Pounds: 50%    Mobility  Bed Mobility               General bed mobility comments: OOB mobility deferred.  Transfers                 General transfer comment: OOB mobility deferred.    Ambulation/Gait                 Stairs            Wheelchair Mobility    Modified Rankin (Stroke Patients Only)       Balance     Sitting balance-Leahy Scale: Fair       Standing balance-Leahy Scale: Poor                      Cognition Arousal/Alertness: Lethargic Behavior During Therapy: WFL for tasks assessed/performed Overall Cognitive Status: History of cognitive impairments - at baseline (family reports )                      Exercises Total Joint Exercises Ankle Circles/Pumps: AROM;Both;10 reps;Supine Quad Sets: AROM;Right;10 reps;Supine Short Arc Quad: Right;10 reps;Supine;AAROM Heel Slides: AAROM;Right;10 reps;Supine Hip ABduction/ADduction: AAROM;Right;10 reps;Supine Straight Leg Raises: AAROM;Right;10 reps;Supine     General Comments        Pertinent Vitals/Pain Pain Assessment: 0-10 Pain Score: 8  Pain Location: R hip Pain Descriptors / Indicators: Discomfort Pain Intervention(s): Monitored during session;Repositioned    Home Living                      Prior Function            PT Goals (current goals can now be found in the care plan section) Acute Rehab PT Goals Patient Stated Goal: not expressed Potential to Achieve Goals: Fair Progress towards PT goals: Progressing toward goals    Frequency  Min 3X/week    PT Plan Current plan remains appropriate    Co-evaluation             End of Session Equipment Utilized During Treatment: Oxygen Activity Tolerance: Treatment limited secondary to medical complications (Comment) (Pt just recieved 1 unit of blood and awaiting second unit.  Tx deferred to bed side exercise.  ) Patient left: in chair;with call bell/phone within reach;with chair alarm set;with family/visitor present     Time: 1517-1530 PT Time Calculation (min) (ACUTE ONLY): 13 min  Charges:  $Therapeutic Exercise: 8-22 mins  G Codes:      Cristela Blue 03/22/2016, 4:48 PM  Governor Rooks, PTA pager [phone]

## 2016-03-22 NOTE — Progress Notes (Signed)
PROGRESS NOTE    Sherri Singh  YSA:[id number] DOB: [birth year] DOA: 03/19/2016 PCP: Lynne Leader, MD  Outpatient Specialists:   Brief Narrative: 75 y.o. 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 Pt states that she lost her balance and fell when she was walking to get her mail at about 8: 00 PM today. She developed pain in right hip pain, but denies any head injury, neck pain or loss of consciousness. Her right hip pain is constant, severe, nonradiating. She does not have muscle spasm. No numbness in her right leg. She did not have dizziness, unilateral weakness or numbness in her extremities before fall. Patient has an increased urinary frequency, but no dysuria or burning on urination. No nausea, vomiting, diarrhea, abdominal pain, chest pain, cough, shortness of breath.  Assessment & Plan:   Principal Problem:   Fracture of femoral neck, right, closed Active Problems:   COPD (chronic obstructive pulmonary disease) with chronic bronchitis (HCC)   Essential tremor   Essential hypertension, benign   Falls frequently   UTI (lower urinary tract infection)  Principal Problem:  Fracture of femoral neck, right, closed Active Problems:  COPD (chronic obstructive pulmonary disease) with exacerbation - Resolved significantly.  Essential tremor  Essential hypertension, benign  Falls frequently  UTI (lower urinary tract infection)   Moderate Nutrition - Dietician consult. Calorie count.   Anemia, likely acute blood loss - For blood transfusion today as per Ortho.   Fracture of femoral neck, right, closed: S/P ORIF of right hip fracture.  COPD: Start Nebs Duoneb, pulmicort and IV steroids.   HTN: Optimize  UTI (lower urinary tract infection): Follow urine cultures. Continue  antibiotics.    Present caloric malnutrition-mild: -Consultation nutrition   DVT ppx: SCD Code Status: Full code (pt has DNR on problem list, but she wants to be on full code today). Family Communication: None at bed side.  Disposition Plan: Anticipate discharge back to rehabilitation facility Consults called: Ortho, Dr. Berenice Primas Admission status: Inpatient/tele  Subjective: No fever or chills. Slight SOB noted. No chest pain  Objective: Filed Vitals:   03/21/16 2101 03/21/16 2324 03/22/16 0553 03/22/16 0752  BP:   101/41   Pulse:  80 72   Temp:   97.4 F (36.3 C)   TempSrc:   Oral   Resp:   16   Height:      Weight:      SpO2: 95%  98% 98%    Intake/Output Summary (Last 24 hours) at 03/22/16 0925 Last data filed at 03/21/16 2322  Gross per 24 hour  Intake     50 ml  Output      0 ml  Net     50 ml   Filed Weights   03/19/16 2100 03/20/16 0240  Weight: 48.535 kg (107 lb) 48.5 kg (106 lb 14.8 oz)    Examination:  General exam: Slight SOB. Respiratory system: Improved air entry. Cardiovascular system: S1 & S2 heard, RRR. Notable neck veins Gastrointestinal system: Abdomen is nondistended, soft and nontender.  . Central nervous system: Alert. Moves all extremities. Extremities: No edema.    Data Reviewed: I have personally reviewed following labs and imaging studies  CBC:  Recent Labs Lab 03/19/16 2139 03/21/16 0026 03/22/16 0755  WBC 7.9 10.7* 5.7  NEUTROABS 5.8  --   --   HGB  13.0 9.7* 7.7*  HCT 38.4 29.9* 23.8*  MCV 89.5 93.1 93.0  PLT 190 132* PENDING   Basic Metabolic Panel:  Recent Labs Lab 03/19/16 2139 03/21/16 0003  NA 142 137  K 4.2 4.0  CL 108 104  CO2 26 29  GLUCOSE 106* 118*  BUN 12 9  CREATININE 0.69 0.72  CALCIUM 10.1 9.3   GFR: Estimated Creatinine Clearance: 47.2 mL/min (by C-G formula based on Cr of 0.72). Liver Function Tests: No results for input(s): AST, ALT, ALKPHOS, BILITOT, PROT, ALBUMIN in the last 168  hours. No results for input(s): LIPASE, AMYLASE in the last 168 hours. No results for input(s): AMMONIA in the last 168 hours. Coagulation Profile:  Recent Labs Lab 03/19/16 2139  INR 1.10   Cardiac Enzymes: No results for input(s): CKTOTAL, CKMB, CKMBINDEX, TROPONINI in the last 168 hours. BNP (last 3 results)  Recent Labs  04/26/15 0940  PROBNP 228.70*   HbA1C: No results for input(s): HGBA1C in the last 72 hours. CBG: No results for input(s): GLUCAP in the last 168 hours. Lipid Profile: No results for input(s): CHOL, HDL, LDLCALC, TRIG, CHOLHDL, LDLDIRECT in the last 72 hours. Thyroid Function Tests: No results for input(s): TSH, T4TOTAL, FREET4, T3FREE, THYROIDAB in the last 72 hours. Anemia Panel: No results for input(s): VITAMINB12, FOLATE, FERRITIN, TIBC, IRON, RETICCTPCT in the last 72 hours. Urine analysis:    Component Value Date/Time   COLORURINE YELLOW 03/19/2016 2159   APPEARANCEUR CLOUDY* 03/19/2016 2159   LABSPEC 1.016 03/19/2016 2159   PHURINE 6.0 03/19/2016 2159   GLUCOSEU NEGATIVE 03/19/2016 2159   HGBUR TRACE* 03/19/2016 2159   BILIRUBINUR NEGATIVE 03/19/2016 2159   KETONESUR NEGATIVE 03/19/2016 2159   PROTEINUR NEGATIVE 03/19/2016 2159   UROBILINOGEN 0.2 05/02/2012 0055   NITRITE POSITIVE* 03/19/2016 2159   LEUKOCYTESUR SMALL* 03/19/2016 2159   Sepsis Labs: '@LABRCNTIP'$ (procalcitonin:4,lacticidven:4)  ) Recent Results (from the past 240 hour(s))  Urine culture     Status: Abnormal (Preliminary result)   Collection Time: 03/19/16  9:59 PM  Result Value Ref Range Status   Specimen Description URINE, CATHETERIZED  Final   Special Requests NONE  Final   Culture >=100,000 COLONIES/mL ESCHERICHIA COLI (A)  Final   Report Status PENDING  Incomplete  Surgical pcr screen     Status: Abnormal   Collection Time: 03/20/16  2:47 AM  Result Value Ref Range Status   MRSA, PCR INVALID RESULTS, SPECIMEN SENT FOR CULTURE (A) NEGATIVE Final    Comment:  NOTIFIED K. OSBOURNE RN AT 0715 ON 06.28.17 BY SHUEA   Staphylococcus aureus INVALID RESULTS, SPECIMEN SENT FOR CULTURE (A) NEGATIVE Final    Comment: NOTIFIED K. OSBOURNE RN AT 0715 ON 06.28.17 BY SHUEA        The Xpert SA Assay (FDA approved for NASAL specimens in patients over 52 years of age), is one component of a comprehensive surveillance program.  Test performance has been validated by [facility] for patients greater than or equal to 14 year old. It is not intended to diagnose infection nor to guide or monitor treatment.   MRSA culture     Status: None (Preliminary result)   Collection Time: 03/20/16  2:47 AM  Result Value Ref Range Status   Specimen Description NOSE  Final   Special Requests NONE  Final   Culture   Final    CULTURE REINCUBATED FOR BETTER GROWTH Performed at [facility]    Report Status PENDING  Incomplete  Radiology Studies: Dg Chest 2 View  03/21/2016  CLINICAL DATA:  Heart failure EXAM: CHEST  2 VIEW COMPARISON:  03/19/2016 FINDINGS: Cardiac shadow is stable. Scarring is again noted in the left upper lung. AE stimulator pack is noted over the right chest wall. The lungs are mildly hyperinflated. No acute bony abnormality is seen. 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  No acute abnormality noted. Electronically Signed   By: Inez Catalina M.D.   On: 03/21/2016 12:33   Dg C-arm 1-60 Min  03/20/2016  CLINICAL DATA:  Right femur intra medullary nail. EXAM: DG C-ARM 61-120 MIN; RIGHT FEMUR 2 VIEWS COMPARISON:  Radiography from yesterday FINDINGS: Fluoroscopy shows intra medullary nail fixation of an intertrochanteric right femur fracture. No evidence of intraoperative fracture. Located hip. IMPRESSION: Fluoroscopy for intertrochanteric femur fracture fixation. Electronically Signed   By: Monte Fantasia M.D.   On: 03/20/2016 18:18   Dg Femur, Min 2 Views Right  03/20/2016  CLINICAL DATA:  Right femur intra medullary nail. EXAM:  DG C-ARM 61-120 MIN; RIGHT FEMUR 2 VIEWS COMPARISON:  Radiography from yesterday FINDINGS: Fluoroscopy shows intra medullary nail fixation of an intertrochanteric right femur fracture. No evidence of intraoperative fracture. Located hip. IMPRESSION: Fluoroscopy for intertrochanteric femur fracture fixation. Electronically Signed   By: Monte Fantasia M.D.   On: 03/20/2016 18:18        Scheduled Meds: . aspirin EC  325 mg Oral Q breakfast  . budesonide (PULMICORT) nebulizer solution  0.25 mg Nebulization BID  . cefTRIAXone (ROCEPHIN)  IV  1 g Intravenous QHS  . cholecalciferol  1,000 Units Oral Daily  . docusate sodium  100 mg Oral BID  . ferrous sulfate  325 mg Oral BID WC  . ipratropium-albuterol  3 mL Nebulization TID  . methylPREDNISolone (SOLU-MEDROL) injection  40 mg Intravenous Q12H  . metoprolol  100 mg Oral BID   Continuous Infusions: . lactated ringers 10 mL/hr at 03/20/16 1348     LOS: 2 days    Time spent: 25 Minutes    Dana Allan, MD  [facility] Pager #: [phone] 7PM-7AM contact night coverage as above

## 2016-03-22 NOTE — Progress Notes (Addendum)
Subjective: 2 Days Post-Op Procedure(s) (LRB): INTRAMEDULLARY (IM) NAIL FEMORAL (Right) Patient reports pain as mild. Patient sitting in bed. She appears comfortable.   Objective: Vital signs in last 24 hours: Temp:  [97.4 F (36.3 C)-97.9 F (36.6 C)] 97.4 F (36.3 C) (06/30 0553) Pulse Rate:  [72-98] 72 (06/30 0553) Resp:  [16] 16 (06/30 0553) BP: (101-122)/(41-42) 101/41 mmHg (06/30 0553) SpO2:  [95 %-100 %] 98 % (06/30 0752)  Intake/Output from previous day: 06/29 0701 - 06/30 0700 In: 50 [IV Piggyback:50] Out: -  Intake/Output this shift: Total I/O In: 120 [P.O.:120] Out: -    Recent Labs  03/19/16 2139 03/21/16 0026 03/22/16 0927  HGB 13.0 9.7* 7.7*    Recent Labs  03/21/16 0026 03/22/16 0927  WBC 10.7* 5.7  RBC 3.21* 2.56*  HCT 29.9* 23.8*  PLT 132* 94*    Recent Labs  03/19/16 2139 03/21/16 0003  NA 142 137  K 4.2 4.0  CL 108 104  CO2 26 29  BUN 12 9  CREATININE 0.69 0.72  GLUCOSE 106* 118*  CALCIUM 10.1 9.3    Recent Labs  03/19/16 2139  INR 1.10   Right hip exam: Neurovascular intact Sensation intact distally Intact pulses distally Dorsiflexion/Plantar flexion intact Incision: dressing C/D/I Compartment soft  Assessment/Plan: 2 Days Post-Op Procedure(s) (LRB): INTRAMEDULLARY (IM) NAIL FEMORAL (Right) Acute blood loss anemia. Hemoglobin 7.7. Plan: Continue physical therapy 50% partial weightbearing on the right. Anticoagulate with aspirin 325 mg daily 1 month postop. We will leave hemoglobin issue up to hospitalists. Could either transfuse 2 units of packed RBCs or treat with oral iron. Okay for discharge to skilled nursing facility when medically stable per hospitalist. Will need follow-up with Dr. Berenice Primas in the office in 2 weeks.  Denai Caba G 03/22/2016, 10:24 AM

## 2016-03-23 DIAGNOSIS — D62 Acute posthemorrhagic anemia: Secondary | ICD-10-CM | POA: Diagnosis not present

## 2016-03-23 DIAGNOSIS — R9431 Abnormal electrocardiogram [ECG] [EKG]: Secondary | ICD-10-CM | POA: Diagnosis not present

## 2016-03-23 DIAGNOSIS — E43 Unspecified severe protein-calorie malnutrition: Secondary | ICD-10-CM | POA: Diagnosis not present

## 2016-03-23 DIAGNOSIS — C3490 Malignant neoplasm of unspecified part of unspecified bronchus or lung: Secondary | ICD-10-CM | POA: Diagnosis not present

## 2016-03-23 DIAGNOSIS — S7291XA Unspecified fracture of right femur, initial encounter for closed fracture: Secondary | ICD-10-CM | POA: Diagnosis not present

## 2016-03-23 DIAGNOSIS — R100 Acute abdomen: Secondary | ICD-10-CM | POA: Diagnosis not present

## 2016-03-23 DIAGNOSIS — I69322 Dysarthria following cerebral infarction: Secondary | ICD-10-CM | POA: Diagnosis not present

## 2016-03-23 DIAGNOSIS — I10 Essential (primary) hypertension: Secondary | ICD-10-CM | POA: Diagnosis not present

## 2016-03-23 DIAGNOSIS — K565 Intestinal adhesions [bands] with obstruction (postprocedural) (postinfection): Secondary | ICD-10-CM | POA: Diagnosis not present

## 2016-03-23 DIAGNOSIS — R112 Nausea with vomiting, unspecified: Secondary | ICD-10-CM | POA: Diagnosis not present

## 2016-03-23 DIAGNOSIS — K5669 Other intestinal obstruction: Secondary | ICD-10-CM | POA: Diagnosis not present

## 2016-03-23 DIAGNOSIS — I69922 Dysarthria following unspecified cerebrovascular disease: Secondary | ICD-10-CM | POA: Diagnosis not present

## 2016-03-23 DIAGNOSIS — S72001A Fracture of unspecified part of neck of right femur, initial encounter for closed fracture: Secondary | ICD-10-CM | POA: Diagnosis not present

## 2016-03-23 DIAGNOSIS — Z4659 Encounter for fitting and adjustment of other gastrointestinal appliance and device: Secondary | ICD-10-CM | POA: Diagnosis not present

## 2016-03-23 DIAGNOSIS — Z87891 Personal history of nicotine dependence: Secondary | ICD-10-CM | POA: Diagnosis not present

## 2016-03-23 DIAGNOSIS — R000 Tachycardia, unspecified: Secondary | ICD-10-CM | POA: Diagnosis not present

## 2016-03-23 DIAGNOSIS — E46 Unspecified protein-calorie malnutrition: Secondary | ICD-10-CM | POA: Diagnosis not present

## 2016-03-23 DIAGNOSIS — J441 Chronic obstructive pulmonary disease with (acute) exacerbation: Secondary | ICD-10-CM | POA: Diagnosis not present

## 2016-03-23 DIAGNOSIS — J690 Pneumonitis due to inhalation of food and vomit: Secondary | ICD-10-CM | POA: Diagnosis not present

## 2016-03-23 DIAGNOSIS — S72009A Fracture of unspecified part of neck of unspecified femur, initial encounter for closed fracture: Secondary | ICD-10-CM | POA: Diagnosis not present

## 2016-03-23 DIAGNOSIS — R296 Repeated falls: Secondary | ICD-10-CM

## 2016-03-23 DIAGNOSIS — R1312 Dysphagia, oropharyngeal phase: Secondary | ICD-10-CM | POA: Diagnosis not present

## 2016-03-23 DIAGNOSIS — G250 Essential tremor: Secondary | ICD-10-CM | POA: Diagnosis not present

## 2016-03-23 DIAGNOSIS — R627 Adult failure to thrive: Secondary | ICD-10-CM | POA: Diagnosis not present

## 2016-03-23 DIAGNOSIS — R262 Difficulty in walking, not elsewhere classified: Secondary | ICD-10-CM | POA: Diagnosis not present

## 2016-03-23 DIAGNOSIS — N390 Urinary tract infection, site not specified: Secondary | ICD-10-CM | POA: Diagnosis not present

## 2016-03-23 DIAGNOSIS — Z4682 Encounter for fitting and adjustment of non-vascular catheter: Secondary | ICD-10-CM | POA: Diagnosis not present

## 2016-03-23 DIAGNOSIS — E876 Hypokalemia: Secondary | ICD-10-CM | POA: Diagnosis not present

## 2016-03-23 DIAGNOSIS — R110 Nausea: Secondary | ICD-10-CM | POA: Diagnosis not present

## 2016-03-23 DIAGNOSIS — D649 Anemia, unspecified: Secondary | ICD-10-CM | POA: Diagnosis not present

## 2016-03-23 DIAGNOSIS — Z66 Do not resuscitate: Secondary | ICD-10-CM | POA: Diagnosis not present

## 2016-03-23 DIAGNOSIS — R1031 Right lower quadrant pain: Secondary | ICD-10-CM | POA: Diagnosis not present

## 2016-03-23 DIAGNOSIS — Z515 Encounter for palliative care: Secondary | ICD-10-CM | POA: Diagnosis not present

## 2016-03-23 DIAGNOSIS — R64 Cachexia: Secondary | ICD-10-CM | POA: Diagnosis not present

## 2016-03-23 DIAGNOSIS — S72001S Fracture of unspecified part of neck of right femur, sequela: Secondary | ICD-10-CM | POA: Diagnosis not present

## 2016-03-23 DIAGNOSIS — Z4789 Encounter for other orthopedic aftercare: Secondary | ICD-10-CM | POA: Diagnosis not present

## 2016-03-23 DIAGNOSIS — I69391 Dysphagia following cerebral infarction: Secondary | ICD-10-CM | POA: Diagnosis not present

## 2016-03-23 DIAGNOSIS — R0682 Tachypnea, not elsewhere classified: Secondary | ICD-10-CM | POA: Diagnosis not present

## 2016-03-23 DIAGNOSIS — E871 Hypo-osmolality and hyponatremia: Secondary | ICD-10-CM | POA: Diagnosis not present

## 2016-03-23 DIAGNOSIS — R278 Other lack of coordination: Secondary | ICD-10-CM | POA: Diagnosis not present

## 2016-03-23 DIAGNOSIS — M4854XA Collapsed vertebra, not elsewhere classified, thoracic region, initial encounter for fracture: Secondary | ICD-10-CM | POA: Diagnosis not present

## 2016-03-23 DIAGNOSIS — R571 Hypovolemic shock: Secondary | ICD-10-CM | POA: Diagnosis not present

## 2016-03-23 DIAGNOSIS — J449 Chronic obstructive pulmonary disease, unspecified: Secondary | ICD-10-CM | POA: Diagnosis not present

## 2016-03-23 DIAGNOSIS — R0600 Dyspnea, unspecified: Secondary | ICD-10-CM | POA: Diagnosis not present

## 2016-03-23 DIAGNOSIS — Z681 Body mass index (BMI) 19 or less, adult: Secondary | ICD-10-CM | POA: Diagnosis not present

## 2016-03-23 DIAGNOSIS — Z809 Family history of malignant neoplasm, unspecified: Secondary | ICD-10-CM | POA: Diagnosis not present

## 2016-03-23 DIAGNOSIS — I714 Abdominal aortic aneurysm, without rupture: Secondary | ICD-10-CM | POA: Diagnosis not present

## 2016-03-23 DIAGNOSIS — N289 Disorder of kidney and ureter, unspecified: Secondary | ICD-10-CM | POA: Diagnosis not present

## 2016-03-23 DIAGNOSIS — E870 Hyperosmolality and hypernatremia: Secondary | ICD-10-CM | POA: Diagnosis not present

## 2016-03-23 DIAGNOSIS — R1310 Dysphagia, unspecified: Secondary | ICD-10-CM | POA: Diagnosis not present

## 2016-03-23 DIAGNOSIS — Z8249 Family history of ischemic heart disease and other diseases of the circulatory system: Secondary | ICD-10-CM | POA: Diagnosis not present

## 2016-03-23 DIAGNOSIS — Z139 Encounter for screening, unspecified: Secondary | ICD-10-CM | POA: Diagnosis not present

## 2016-03-23 DIAGNOSIS — D72829 Elevated white blood cell count, unspecified: Secondary | ICD-10-CM | POA: Diagnosis not present

## 2016-03-23 DIAGNOSIS — K5660 Unspecified intestinal obstruction: Secondary | ICD-10-CM | POA: Diagnosis not present

## 2016-03-23 DIAGNOSIS — M6281 Muscle weakness (generalized): Secondary | ICD-10-CM | POA: Diagnosis not present

## 2016-03-23 DIAGNOSIS — E640 Sequelae of protein-calorie malnutrition: Secondary | ICD-10-CM | POA: Diagnosis not present

## 2016-03-23 DIAGNOSIS — R5381 Other malaise: Secondary | ICD-10-CM | POA: Diagnosis not present

## 2016-03-23 HISTORY — PX: HIP SURGERY: SHX245

## 2016-03-23 LAB — TYPE AND SCREEN
ABO/RH(D): A POS
ANTIBODY SCREEN: NEGATIVE
UNIT DIVISION: 0
Unit division: 0

## 2016-03-23 LAB — CBC
HCT: 36.5 % (ref 36.0–46.0)
Hemoglobin: 12.1 g/dL (ref 12.0–15.0)
MCH: 29.8 pg (ref 26.0–34.0)
MCHC: 33.2 g/dL (ref 30.0–36.0)
MCV: 89.9 fL (ref 78.0–100.0)
PLATELETS: 135 10*3/uL — AB (ref 150–400)
RBC: 4.06 MIL/uL (ref 3.87–5.11)
RDW: 14.7 % (ref 11.5–15.5)
WBC: 11.8 10*3/uL — AB (ref 4.0–10.5)

## 2016-03-23 MED ORDER — PREDNISONE 10 MG PO TABS: ORAL_TABLET | ORAL | Status: DC

## 2016-03-23 MED ORDER — FERROUS SULFATE 325 (65 FE) MG PO TABS: 325.0000 mg | ORAL_TABLET | Freq: Two times a day (BID) | ORAL | Status: AC

## 2016-03-23 MED ORDER — METOPROLOL TARTRATE 100 MG PO TABS: 100.0000 mg | ORAL_TABLET | Freq: Two times a day (BID) | ORAL | Status: AC

## 2016-03-23 MED ORDER — TIZANIDINE HCL 2 MG PO CAPS: 2.0000 mg | ORAL_CAPSULE | Freq: Three times a day (TID) | ORAL | Status: DC

## 2016-03-23 MED ORDER — DOCUSATE SODIUM 100 MG PO CAPS: 100.0000 mg | ORAL_CAPSULE | Freq: Two times a day (BID) | ORAL | Status: AC

## 2016-03-23 MED ORDER — CIPROFLOXACIN HCL 500 MG PO TABS: 500.0000 mg | ORAL_TABLET | Freq: Two times a day (BID) | ORAL | Status: AC

## 2016-03-23 MED ORDER — METHOCARBAMOL 500 MG PO TABS
500.0000 mg | ORAL_TABLET | Freq: Three times a day (TID) | ORAL | Status: DC | PRN
Start: 2016-03-23 — End: 2016-03-23

## 2016-03-23 MED ORDER — POLYETHYLENE GLYCOL 3350 17 G PO PACK: 17.0000 g | PACK | Freq: Every day | ORAL | Status: AC | PRN

## 2016-03-23 NOTE — Progress Notes (Signed)
Notified pt's daughter about pt's discharge. She is aware pt's discharge status. Report gave to admission coordinator, Janett Billow at [facility]. Pt's IV access d/c without complication. Pt 's pain is well managed  Before pt left.

## 2016-03-23 NOTE — Discharge Summary (Signed)
Physician Discharge Summary  Patient ID: Sherri Singh MRN: [id number] DOB/AGE: 05-16-1941 75 y.o.  Admit date: 03/19/2016 Discharge date: 03/23/2016  Admission Diagnoses:  Discharge Diagnoses:  Principal Problem:   Fracture of femoral neck, right, closed Active Problems:   COPD (chronic obstructive pulmonary disease) with chronic bronchitis (HCC)   Essential tremor   Essential hypertension, benign   Falls frequently   UTI (lower urinary tract infection)   Discharged Condition: stable  Hospital Course: 75 y.o. female with medical history significant of frequent fall, hypertension, COPD, former smoker, small cell lung cancer (s/p radiotherapy and chemotherapy, in remission), essential tremor, aortic regurgitation, who presents with right hip pain after fall. Pt states that she lost her balance and fell when she was walking to get her mail. Work up done revealed intertrochanteric right hip fracture. Patient was admitted for further assessment and management. Orthopedic team was consulted to direct patient's care. Patient underwent open reduction and internal fixation of right intertrochanteric hip fracture on 03/20/16. Post op, patient developed shortness of breath thought to be related to the patient's COPD. Patient was managed with IV steroids and neb treatments, and will be discharged on tapering dose of steroids. Patient was also noted to have UTI secondary to E. Coli during the hospital stay and patient was treated with IV Rocephin, and will be discharged on oral ciprofloxacin. Patient was also transfused with 2 units of PRBC during the hospital stay.  Consults: orthopedic surgery  Significant Diagnostic Studies: Imaging studies. Urine culture grew pan sensitive E. Coli.  Discharge medication - Please see the Med Rec.  Discharge Exam: Blood pressure 125/55, pulse 76, temperature 98.1 F (36.7 C), temperature source Oral, resp. rate 16, height '5\' 4"'$  (1.626 m), weight 48.5 kg (106 lb  14.8 oz), SpO2 95 %.    Disposition: 01-Home or Self Care  Discharge Instructions    Diet - low sodium heart healthy    Complete by:  As directed      Discharge instructions    Complete by:  As directed   Activity as per Rehab team. Follow up with PCP within one week. Follow up with the Orthopedic team as soon as possible     Increase activity slowly    Complete by:  As directed      Partial weight bearing    Complete by:  As directed   % Body Weight:  50%  Laterality:  right  Extremity:  Lower            Medication List    STOP taking these medications        AMBULATORY NON FORMULARY MEDICATION     Zoster Vaccine Live (PF) 19400 UNT/0.65ML injection  Commonly known as:  ZOSTAVAX      TAKE these medications        aspirin EC 325 MG tablet  Take 1 tablet (325 mg total) by mouth daily. Take x 1 month post op to decrease risk of blood clots.     budesonide-formoterol 160-4.5 MCG/ACT inhaler  Commonly known as:  SYMBICORT  Inhale 2 puffs into the lungs 2 (two) times daily.     ciprofloxacin 500 MG tablet  Commonly known as:  CIPRO  Take 1 tablet (500 mg total) by mouth 2 (two) times daily.     docusate sodium 100 MG capsule  Commonly known as:  COLACE  Take 1 capsule (100 mg total) by mouth 2 (two) times daily.     ferrous sulfate 325 (65 FE) MG  tablet  Take 1 tablet (325 mg total) by mouth 2 (two) times daily with a meal.     HYDROcodone-acetaminophen 5-325 MG tablet  Commonly known as:  NORCO  Take 1-2 tablets by mouth every 6 (six) hours as needed for severe pain.     methocarbamol 500 MG tablet  Commonly known as:  ROBAXIN  Take 1 tablet (500 mg total) by mouth every 8 (eight) hours as needed for muscle spasms.     metoprolol 100 MG tablet  Commonly known as:  LOPRESSOR  Take 1 tablet (100 mg total) by mouth 2 (two) times daily.     polyethylene glycol packet  Commonly known as:  MIRALAX / GLYCOLAX  Take 17 g by mouth daily as needed for mild  constipation.     predniSONE 10 MG tablet  Commonly known as:  DELTASONE  Prednisone 60 MG po once daily for 3 days, then 40 MG po once daily for 3 days, then 30 MG po once daily for 3 days, then 20 MG po once daily for 3 days and then 10 MG po once daily for 3 days and stop.     umeclidinium bromide 62.5 MCG/INH Aepb  Commonly known as:  INCRUSE ELLIPTA  Inhale 1 puff into the lungs daily.     VITAMIN D PO  Take 1 tablet by mouth daily.           Follow-up Information    Follow up with GRAVES,JOHN L, MD. Schedule an appointment as soon as possible for a visit in 2 weeks.   Specialty:  Orthopedic Surgery   Contact information:   [place] [address] [phone]       Signed: Bonnell Public 03/23/2016, 2:10 PM

## 2016-03-23 NOTE — Progress Notes (Signed)
Nutrition Follow-up / Consult  DOCUMENTATION CODES:   Severe malnutrition in context of chronic illness, Underweight  INTERVENTION:    Mighty Shake II TID with meals, each supplement provides 480-500 kcals and 20-23 grams of protein  Calorie count through Monday, RD to follow-up Monday with calorie count results.  NUTRITION DIAGNOSIS:   Malnutrition related to chronic illness as evidenced by severe depletion of muscle mass, moderate depletion of body fat, moderate depletions of muscle mass, percent weight loss.  Ongoing  GOAL:   Patient will meet greater than or equal to 90% of their needs  Progressing  MONITOR:   PO intake, Supplement acceptance, I & O's, Labs, Weight trends  REASON FOR ASSESSMENT:   Consult Calorie Count  ASSESSMENT:   Sherri Singh is a 75 y.o. 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  S/P ORIF of right hip fracture on 6/29. Patient is consuming 30-100% of heart healthy meals. Patient says she has been eating well and does not like Ensure supplements. She agreed to try chocolate Mighty Shake II with meals. Calorie count has been ordered, but no meal tickets have been saved yet. Will continue calorie count until Monday.  Diet Order:  Diet Heart Room service appropriate?: Yes; Fluid consistency:: Thin  Skin:  Wound (see comment) (Ecchymosis to Leg, Lip, and Hand)  Last BM:  7/1  Height:   Ht Readings from Last 1 Encounters:  03/20/16 '5\' 4"'$  (1.626 m)    Weight:   Wt Readings from Last 1 Encounters:  03/20/16 106 lb 14.8 oz (48.5 kg)    Ideal Body Weight:  54.54 kg  BMI:  Body mass index is 18.34 kg/(m^2). Underweight  Estimated Nutritional Needs:   Kcal:  1450-1650 calories  Protein:  75-85 gm  Fluid:  >/= 1.5 L  EDUCATION  NEEDS:   Education needs addressed  Molli Barrows, [place], [place], [place] Pager [phone] After Hours Pager [phone]

## 2016-03-23 NOTE — Clinical Social Work Note (Signed)
CSW notified by nurse that doctor has cleared pt to be discharged. Pt notified she is discharging from hospital to [facility] place for SNF. CSW contacted [facility] to inform pt is discharging from hospital and will be transported to SNF by [facility]. Pt daughter contacted via phone and PT nurse informed to call [facility] and give report.

## 2016-03-25 ENCOUNTER — Other Ambulatory Visit: Payer: Self-pay

## 2016-03-25 ENCOUNTER — Non-Acute Institutional Stay (SKILLED_NURSING_FACILITY): Payer: Medicare Other | Admitting: Internal Medicine

## 2016-03-25 ENCOUNTER — Encounter: Payer: Self-pay | Admitting: Internal Medicine

## 2016-03-25 DIAGNOSIS — J441 Chronic obstructive pulmonary disease with (acute) exacerbation: Secondary | ICD-10-CM | POA: Diagnosis not present

## 2016-03-25 DIAGNOSIS — I10 Essential (primary) hypertension: Secondary | ICD-10-CM | POA: Diagnosis not present

## 2016-03-25 DIAGNOSIS — R5381 Other malaise: Secondary | ICD-10-CM

## 2016-03-25 DIAGNOSIS — E46 Unspecified protein-calorie malnutrition: Secondary | ICD-10-CM

## 2016-03-25 DIAGNOSIS — D696 Thrombocytopenia, unspecified: Secondary | ICD-10-CM | POA: Diagnosis not present

## 2016-03-25 DIAGNOSIS — S72001S Fracture of unspecified part of neck of right femur, sequela: Secondary | ICD-10-CM

## 2016-03-25 DIAGNOSIS — K5900 Constipation, unspecified: Secondary | ICD-10-CM

## 2016-03-25 DIAGNOSIS — G250 Essential tremor: Secondary | ICD-10-CM

## 2016-03-25 DIAGNOSIS — D509 Iron deficiency anemia, unspecified: Secondary | ICD-10-CM

## 2016-03-25 DIAGNOSIS — R4189 Other symptoms and signs involving cognitive functions and awareness: Secondary | ICD-10-CM

## 2016-03-25 DIAGNOSIS — B9620 Unspecified Escherichia coli [E. coli] as the cause of diseases classified elsewhere: Secondary | ICD-10-CM

## 2016-03-25 DIAGNOSIS — N390 Urinary tract infection, site not specified: Secondary | ICD-10-CM | POA: Diagnosis not present

## 2016-03-25 DIAGNOSIS — R1310 Dysphagia, unspecified: Secondary | ICD-10-CM | POA: Diagnosis not present

## 2016-03-25 DIAGNOSIS — D72829 Elevated white blood cell count, unspecified: Secondary | ICD-10-CM | POA: Diagnosis not present

## 2016-03-25 MED ORDER — HYDROCODONE-ACETAMINOPHEN 5-325 MG PO TABS: 1.0000 | ORAL_TABLET | Freq: Four times a day (QID) | ORAL | Status: AC | PRN

## 2016-03-25 NOTE — Progress Notes (Signed)
LOCATION: [place]  PCP: Lynne Leader, MD   Code Status: Full Code  Goals of care: Advanced Directive information Advanced Directives 03/20/2016  Does patient have an advance directive? Yes  Type of Advance Directive Living will  Does patient want to make changes to advanced directive? No - Patient declined  Copy of advanced directive(s) in chart? Yes       Extended Emergency Contact Information Primary Emergency Contact: Don Perking [place] Phone: [phone] Relation: Son   Allergies  Allergen Reactions  . Codeine Other (See Comments)  . Other Other (See Comments)    Peanuts, Causes headaches Allergic to a type of pain medication, unable to recall name of medication at this time  . Budesonide-Formoterol Fumarate     REACTION: hoarseness  . Iohexol      Code: HIVES, Desc: pt developed hives 30+ yrs ago; needs 13 hour prep in future per Dr Leonia Reeves; 06/26/09 slg   . Morphine Other (See Comments)    Unsure of Reaction  . Shellfish-Derived Products Other (See Comments)    Positive allergy test- has never had a reaction  . Iodine Rash    Boils on skin    Chief Complaint  Patient presents with  . New Admit To SNF    New Admission     HPI:  Patient is a 75 y.o. female seen today for short term rehabilitation post hospital admission from 03/19/16-03/23/16 with right femoral neck closed fracture s/p fall. She underwent surgical repair. She had copd exacerbation and e.coli uti post op and required antibiotics with steroids and nebulizer treatment. She had acute blood loss anemia and had 2 u prbc transfusion. She has history of copd, HTN, essential tremor, frequent falls, memory loss among others. She is seen in her room today. Her daughter and grand-daughters are present at bedside. Her pain has been controlled with current pain regimen.   Review of Systems:  Constitutional: Negative for fever, chills, diaphoresis. Feels weak and tired.    HENT: Negative for headache, congestion, sore throat. Positive for nasal discharge and difficulty swallowing liquid and solids.   Eyes: Negative for blurred vision, double vision and discharge.  Respiratory: Negative for wheezing. Positive for cough with phlegm and shortness of breath with minimal exertion.   Cardiovascular: Negative for chest pain, palpitations, leg swelling.  Gastrointestinal: Negative for heartburn, nausea, vomiting, abdominal pain. Last bowel movement was 2 days back.  Genitourinary: Negative for dysuria and flank pain.  Musculoskeletal: Negative for back pain, fall in the facility.  Skin: Negative for itching, rash.  Neurological: Negative for dizziness. Psychiatric/Behavioral: Negative for depression   Past Medical History  Diagnosis Date  . Lung cancer ([place])     lung ca dx 04/2009  . Benign essential tremor   . Lung cancer ([place])   . COPD (chronic obstructive pulmonary disease) [facility])    Past Surgical History  Procedure Laterality Date  . Deep brain stimulator placement    . Mouth surgery    . Breast surgery    . Appendectomy    . Ovary surgery    . Femur im nail Right 03/20/2016    Procedure: INTRAMEDULLARY (IM) NAIL FEMORAL;  Surgeon: Dorna Leitz, MD;  Location: [place];  Service: Orthopedics;  Laterality: Right;   Social History:   reports that she quit smoking about 18 years ago. She does not have any smokeless tobacco history on file. She reports that she does not drink alcohol or use illicit drugs.  Family History  Problem Relation Age of Onset  . Cancer Mother     lung  . Heart attack Mother   . Diabetes Mother     Medications:   Medication List       This list is accurate as of: 03/25/16 12:20 PM.  Always use your most recent med list.               aspirin EC 325 MG tablet  Take 1 tablet (325 mg total) by mouth daily. Take x 1 month post op to decrease risk of blood clots.     budesonide-formoterol 160-4.5 MCG/ACT inhaler  Commonly  known as:  SYMBICORT  Inhale 2 puffs into the lungs 2 (two) times daily.     ciprofloxacin 500 MG tablet  Commonly known as:  CIPRO  Take 1 tablet (500 mg total) by mouth 2 (two) times daily.     docusate sodium 100 MG capsule  Commonly known as:  COLACE  Take 1 capsule (100 mg total) by mouth 2 (two) times daily.     ferrous sulfate 325 (65 FE) MG tablet  Take 1 tablet (325 mg total) by mouth 2 (two) times daily with a meal.     HYDROcodone-acetaminophen 5-325 MG tablet  Commonly known as:  NORCO  Take 1-2 tablets by mouth every 6 (six) hours as needed for severe pain.     INCRUSE ELLIPTA 62.5 MCG/INH Aepb  Generic drug:  umeclidinium bromide  Inhale 1 puff into the lungs daily.     methocarbamol 500 MG tablet  Commonly known as:  ROBAXIN  Take 500 mg by mouth every 8 (eight) hours as needed for muscle spasms.     metoprolol 100 MG tablet  Commonly known as:  LOPRESSOR  Take 1 tablet (100 mg total) by mouth 2 (two) times daily.     polyethylene glycol packet  Commonly known as:  MIRALAX / GLYCOLAX  Take 17 g by mouth daily as needed for mild constipation.     predniSONE 10 MG tablet  Commonly known as:  DELTASONE  Prednisone 60 MG po once daily for 3 days, then 40 MG po once daily for 3 days, then 30 MG po once daily for 3 days, then 20 MG po once daily for 3 days and then 10 MG po once daily for 3 days and stop.     VITAMIN D PO  Take 1 tablet by mouth daily.        Immunizations: Immunization History  Administered Date(s) Administered  . Influenza-Unspecified 09/24/2015  . PPD Test 03/23/2016  . Pneumococcal Conjugate-13 01/20/2015  . Pneumococcal Polysaccharide-23 03/18/2016  . Tdap 03/07/2012     Physical Exam:  Filed Vitals:   03/25/16 1214  BP: 166/78  Pulse: 73  Temp: 98 F (36.7 C)  TempSrc: Oral  Resp: 18  Height: '5\' 4"'$  (1.626 m)  Weight: 106 lb (48.081 kg)  SpO2: 94%   Body mass index is 18.19 kg/(m^2).  General- elderly female, thin  built, frail, in no acute distress Head- normocephalic, atraumatic Nose- no maxillary or frontal sinus tenderness, no nasal discharge Throat- moist mucus membrane, has dentures Eyes- PERRLA, EOMI, no pallor, no icterus, no discharge, normal conjunctiva, normal sclera Neck- no cervical lymphadenopathy Cardiovascular- normal s1,s2, no murmur, no leg edema Respiratory- bilateral poor air movement, no wheeze, no rhonchi, no crackles, no use of accessory muscles Abdomen- bowel sounds present, soft, non tender Musculoskeletal- able to move all 4 extremities, limited right leg  range of motion Neurological- alert and oriented to person and place only Skin- warm and dry, mepilex dressing to her right hip and leg surgical incision site with staples in place, has bruising to both arms Psychiatry- normal mood and affect    Labs reviewed: Basic Metabolic Panel:  Recent Labs  03/19/16 2139 03/21/16 0003 03/22/16 0927  NA 142 137 139  K 4.2 4.0 3.7  CL 108 104 102  CO2 '26 29 28  '$ GLUCOSE 106* 118* 161*  BUN '12 9 12  '$ CREATININE 0.69 0.72 0.77  CALCIUM 10.1 9.3 9.8  PHOS  --   --  2.6   Liver Function Tests:  Recent Labs  04/26/15 0940 09/13/15 1604 03/22/16 0927  AST 16 44*  --   ALT 13 29  --   ALKPHOS 120 89  --   BILITOT 0.6 0.8  --   PROT 6.6 6.6  --   ALBUMIN 4.1 4.0 2.6*   No results for input(s): LIPASE, AMYLASE in the last 8760 hours. No results for input(s): AMMONIA in the last 8760 hours. CBC:  Recent Labs  09/15/15 1408 03/19/16 2139 03/21/16 0026 03/22/16 0927 03/22/16 2250 03/23/16 0758  WBC 6.7 7.9 10.7* 6.8  5.7  --  11.8*  NEUTROABS 4.7 5.8  --  6.1  --   --   HGB 13.7 13.0 9.7* 8.7*  7.7* 11.6* 12.1  HCT 41.9 38.4 29.9* 27.1*  23.8* 35.0* 36.5  MCV 93.5 89.5 93.1 92.8  93.0  --  89.9  PLT 170 190 132* 111*  94*  --  135*   Cardiac Enzymes:  Recent Labs  09/13/15 1604  CKTOTAL 521*   BNP: Invalid input(s): POCBNP CBG: No results for  input(s): GLUCAP in the last 8760 hours.  Radiological Exams: Dg Chest 2 View  03/21/2016  CLINICAL DATA:  Heart failure EXAM: CHEST  2 VIEW COMPARISON:  03/19/2016 FINDINGS: Cardiac shadow is stable. Scarring is again noted in the left upper lung. AE stimulator pack is noted over the right chest wall. The lungs are mildly hyperinflated. No acute bony abnormality is seen. 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  No acute abnormality noted. Electronically Signed   By: Inez Catalina M.D.   On: 03/21/2016 12:33   Ct Head Wo Contrast  03/20/2016  CLINICAL DATA:  [place] going to get mail. Headache and pain. History of lung cancer, tremor, deep brain stimulator. EXAM: CT HEAD WITHOUT CONTRAST TECHNIQUE: Contiguous axial images were obtained from the base of the skull through the vertex without intravenous contrast. COMPARISON:  CT HEAD September 15, 2015 FINDINGS: INTRACRANIAL CONTENTS: Similar moderate ventriculomegaly on the basis of global parenchymal brain volume loss. Bilateral deep brain stimulator leads terminate in the region of the thalamus. Stable gliosis along the catheter tract. Patchy supratentorial white matter hypodensities exclusive of the aforementioned abnormality. No intraparenchymal hemorrhage, mass effect, midline shift or acute large vascular territory infarcts. Basal cisterns are patent. Mild calcific atherosclerosis of the carotid siphon. ORBITS: The included ocular globes and orbital contents are non-suspicious. SINUSES: Mild RIGHT sphenoid sinusitis. Mastoid air cells are well aerated. SKULL/SOFT TISSUES: No skull fracture. No significant soft tissue swelling. Stimulators leads course in RIGHT neck. IMPRESSION: No acute intracranial process. DBS leads at the level of the bilateral thalamus. Moderate global parenchymal brain volume loss and moderate chronic small vessel ischemic disease. Electronically Signed   By: Elon Alas M.D.   On: 03/20/2016 01:42   Dg Chest [facility] 1  [address]  03/19/2016  CLINICAL DATA:  Right hip fracture EXAM: PORTABLE CHEST 1 VIEW COMPARISON:  None. FINDINGS: Curvilinear scarring in the left upper lung. No airspace consolidation. No large effusion. No pneumothorax. Normal pulmonary vasculature. Implanted stimulating device superimposes the right hemi thorax with leads extending up the right neck. IMPRESSION: Curvilinear scarring in the upper left lung, treatment related. No acute cardiopulmonary findings. Electronically Signed   By: Andreas Newport M.D.   On: 03/19/2016 22:59   Dg C-arm 1-60 Min  03/20/2016  CLINICAL DATA:  Right femur intra medullary nail. EXAM: DG C-ARM 61-120 MIN; RIGHT FEMUR 2 VIEWS COMPARISON:  Radiography from yesterday FINDINGS: Fluoroscopy shows intra medullary nail fixation of an intertrochanteric right femur fracture. No evidence of intraoperative fracture. Located hip. IMPRESSION: Fluoroscopy for intertrochanteric femur fracture fixation. Electronically Signed   By: Monte Fantasia M.D.   On: 03/20/2016 18:18   Dg Hip Unilat With Pelvis 2-3 Views Right  03/19/2016  CLINICAL DATA:  75 year old who fell and injured the right hip. Initial encounter. EXAM: DG HIP (WITH OR WITHOUT PELVIS) 2-3V RIGHT COMPARISON:  None. FINDINGS: Acute comminuted, mildly displaced intertrochanteric right femoral neck fracture. Hip joint intact with well preserved joint space for age. Included AP pelvis demonstrates a normal-appearing contralateral left hip joint. Sacroiliac joints and symphysis pubis intact. Osseous demineralization. IMPRESSION: Acute traumatic comminuted, mildly displaced intertrochanteric right femoral neck fracture. Electronically Signed   By: Evangeline Dakin M.D.   On: 03/19/2016 21:35   Dg Femur, Min 2 Views Right  03/20/2016  CLINICAL DATA:  Right femur intra medullary nail. EXAM: DG C-ARM 61-120 MIN; RIGHT FEMUR 2 VIEWS COMPARISON:  Radiography from yesterday FINDINGS: Fluoroscopy shows intra medullary nail fixation of an  intertrochanteric right femur fracture. No evidence of intraoperative fracture. Located hip. IMPRESSION: Fluoroscopy for intertrochanteric femur fracture fixation. Electronically Signed   By: Monte Fantasia M.D.   On: 03/20/2016 18:18    Assessment/Plan  Physical deconditioning Will have her work with physical therapy and occupational therapy team to help with gait training and muscle strengthening exercises.fall precautions. Skin care. Encourage to be out of bed.   Right femoral neck fracture S/p surgical repair. Has orthopedics follow up. Will have patient work with PT/OT as tolerated to regain strength and restore function.  Fall precautions are in place. 50% weight bearing to RLE. Continue aspirin 325 mg daily x 1 month for dvt prophylaxis. Continue norco 5-325 mg 1-2 tab q6h prn pain and robaxin 500 mg q8h prn muscle spasm. Continue vitamin d supplement  copd exacerbation Continue symbicort bid. Continue prednisone tapering dose and complete it. Add duoneb tid x 5 days and then q8h prn for dyspnea and wheezing for now. Continue incruse ellipta.   E.coli uti Continue and complete ciprofloxacin 500 mg bid until 03/26/16. Encouraged hydration  Protein calorie malnutrition Get RD consult. Monitor weekly weight. Encourage po intake  Cognitive impairment Per daughter, has had ongoing memory loss. Get SLP consult. Provide supportive care. Get BIMS  Dysphagia Get SLP consult and aspiration precautions to be taken  Leukocytosis Afebrile, likely from stress response, monitor wbc  Thrombocytopenia No bleed reported, monitor clinically  Constipation Continue colace 100 mg bid and miralax 17 g qd prn  Iron def anemia Monitor cbc, continue feso4 bid  HTN Elevated bp, check this bid x 1 week. cotninue metoprolol 100 mg bid.   Essential tremor Has chronic electrode implant, monitor   Goals of care: short term rehabilitation   Labs/tests ordered: cbc, cmp 03/27/16  Family/  staff  Communication: reviewed care plan with patient and nursing supervisor    Blanchie Serve, MD Internal Medicine [place], [place] [address] Cell Phone (Monday-Friday 8 am - 5 pm): [phone] On Call: [phone] and follow prompts after 5 pm and on weekends Office Phone: [phone] Office Fax: [phone]

## 2016-03-25 NOTE — Telephone Encounter (Signed)
Refill request from [facility]

## 2016-04-04 LAB — BASIC METABOLIC PANEL
BUN: 35 mg/dL — AB (ref 4–21)
CREATININE: 0.8 mg/dL (ref 0.5–1.1)
GLUCOSE: 122 mg/dL
POTASSIUM: 4.1 mmol/L (ref 3.4–5.3)
Sodium: 142 mmol/L (ref 137–147)

## 2016-04-04 LAB — CBC AND DIFFERENTIAL
HEMATOCRIT: 45 % (ref 36–46)
HEMOGLOBIN: 14.7 g/dL (ref 12.0–16.0)
Neutrophils Absolute: 12760 /uL
Platelets: 323 10*3/uL (ref 150–399)
WBC: 14.5 10*3/mL

## 2016-04-09 LAB — BASIC METABOLIC PANEL
BUN: 64 mg/dL — AB (ref 4–21)
CREATININE: 1.3 mg/dL — AB (ref 0.5–1.1)
Glucose: 100 mg/dL
POTASSIUM: 3.6 mmol/L (ref 3.4–5.3)
SODIUM: 145 mmol/L (ref 137–147)

## 2016-04-09 LAB — CBC AND DIFFERENTIAL
HCT: 49 % — AB (ref 36–46)
HEMOGLOBIN: 15.8 g/dL (ref 12.0–16.0)
Neutrophils Absolute: 14529 /uL
PLATELETS: 290 10*3/uL (ref 150–399)
WBC: 16.7 10^3/mL

## 2016-04-10 ENCOUNTER — Encounter: Payer: Self-pay | Admitting: Adult Health

## 2016-04-10 ENCOUNTER — Non-Acute Institutional Stay (SKILLED_NURSING_FACILITY): Payer: Medicare Other | Admitting: Adult Health

## 2016-04-10 DIAGNOSIS — S72001S Fracture of unspecified part of neck of right femur, sequela: Secondary | ICD-10-CM

## 2016-04-10 DIAGNOSIS — N289 Disorder of kidney and ureter, unspecified: Secondary | ICD-10-CM | POA: Diagnosis not present

## 2016-04-10 DIAGNOSIS — R1310 Dysphagia, unspecified: Secondary | ICD-10-CM | POA: Diagnosis not present

## 2016-04-10 DIAGNOSIS — D62 Acute posthemorrhagic anemia: Secondary | ICD-10-CM | POA: Diagnosis not present

## 2016-04-10 DIAGNOSIS — D72829 Elevated white blood cell count, unspecified: Secondary | ICD-10-CM | POA: Diagnosis not present

## 2016-04-10 DIAGNOSIS — R112 Nausea with vomiting, unspecified: Secondary | ICD-10-CM

## 2016-04-10 NOTE — Progress Notes (Signed)
Patient ID: Sherri Singh, female   DOB: [birth year], 75 y.o.   MRN: [id number]    DATE:  04/10/2016   MRN:  [id number]  BIRTHDAY: 08/27/1941  Facility:  Nursing Home Location:  [place] Room Number: 1008-P  LEVEL OF CARE:  SNF [phone])  Contact Information    Name Relation Home Work [place] Son [phone]     Iyengar,Richard Son   [phone]   Dennison Bulla Daughter   [phone]       Code Status History    Date Active Date Inactive Code Status Order ID Comments User Context   03/20/2016 12:54 AM 03/23/2016  6:58 PM Full Code [id number]  Ivor Costa, MD ED       Chief Complaint  Patient presents with  . Acute Visit    Dehydration, leukocytosis    HISTORY OF PRESENT ILLNESS:  This is a 75 year old female who has been nauseated and has reported to have vomited small amount of mucus X 2. She has poor appetite and PO intake. Latest creatinine 1.29, BUN 64, Na 145, wbc 16.7 and Monocytes 1169. CXR showed no infiltrates and awaiting UA CS result. Son @ bedside and discussed lab result and plans. She was on Prednisone tapering dose which might have contributed to the leukocytosis.  Son was at bedside and reports that it is hard for his mother to swallow medications. She has been noticed to spit out her medications. She has been missing her orthopedic appointment since she doesn't feel good enough to go.  She has been admitted to [facility] on 03/23/16 from [facility] with right femoral neck closed fracture sustained from a fall. She had surgical repair on 03/20/16. She had COPD exacerbation and E. Coli UTI post-op. She was given antibiotics with steroids and nebulizer treatment. She was transfused 2 units PRBC due to acute blood loss anemia.  She is currently having a short-term rehabilitation.  PAST MEDICAL HISTORY:  Past Medical History  Diagnosis Date  . Lung cancer ([place])     lung ca dx 04/2009  . Benign essential  tremor   . Lung cancer ([place])   . COPD (chronic obstructive pulmonary disease) ([facility])      CURRENT MEDICATIONS: Reviewed  Patient's Medications  New Prescriptions   No medications on file  Previous Medications   ASPIRIN EC 325 MG TABLET    Take 1 tablet (325 mg total) by mouth daily. Take x 1 month post op to decrease risk of blood clots.   BUDESONIDE-FORMOTEROL (SYMBICORT) 160-4.5 MCG/ACT INHALER    Inhale 2 puffs into the lungs 2 (two) times daily.   CHOLECALCIFEROL (VITAMIN D PO)    Take 1 tablet by mouth daily. D3   DOCUSATE SODIUM (COLACE) 100 MG CAPSULE    Take 1 capsule (100 mg total) by mouth 2 (two) times daily.   FERROUS SULFATE 325 (65 FE) MG TABLET    Take 1 tablet (325 mg total) by mouth 2 (two) times daily with a meal.   HYDROCODONE-ACETAMINOPHEN (NORCO) 5-325 MG TABLET    Take 1-2 tablets by mouth every 6 (six) hours as needed for severe pain.   IPRATROPIUM-ALBUTEROL (DUONEB) 0.5-2.5 (3) MG/3ML SOLN    Take 3 mLs by nebulization every 8 (eight) hours as needed (Dyspnea or wheezing).   METHOCARBAMOL (ROBAXIN) 500 MG TABLET    Take 500 mg by mouth every 8 (eight) hours as needed for muscle spasms.   METOPROLOL (LOPRESSOR) 100 MG  TABLET    Take 1 tablet (100 mg total) by mouth 2 (two) times daily.   ONDANSETRON (ZOFRAN) 4 MG TABLET    Take 4 mg by mouth every 6 (six) hours as needed for nausea.   ONDANSETRON (ZOFRAN) 4 MG TABLET    Take 4 mg by mouth 2 (two) times daily. Give before breakfast and lunch   POLYETHYLENE GLYCOL (MIRALAX / GLYCOLAX) PACKET    Take 17 g by mouth daily as needed for mild constipation.  Modified Medications   No medications on file  Discontinued Medications   PREDNISONE (DELTASONE) 10 MG TABLET    Prednisone 60 MG po once daily for 3 days, then 40 MG po once daily for 3 days, then 30 MG po once daily for 3 days, then 20 MG po once daily for 3 days and then 10 MG po once daily for 3 days and stop.   UMECLIDINIUM BROMIDE (INCRUSE ELLIPTA) 62.5 MCG/INH AEPB     Inhale 1 puff into the lungs daily.     Allergies  Allergen Reactions  . Codeine Other (See Comments)  . Other Other (See Comments)    Peanuts, Causes headaches Allergic to a type of pain medication, unable to recall name of medication at this time  . Budesonide-Formoterol Fumarate     REACTION: hoarseness  . Iohexol      Code: HIVES, Desc: pt developed hives 30+ yrs ago; needs 13 hour prep in future per Dr Leonia Reeves; 06/26/09 slg   . Morphine Other (See Comments)    Unsure of Reaction  . Shellfish-Derived Products Other (See Comments)    Positive allergy test- has never had a reaction  . Iodine Rash    Boils on skin     REVIEW OF SYSTEMS:  GENERAL: no change in appetite, no fatigue, no weight changes, no fever, chills or weakness EYES: Denies change in vision, dry eyes, eye pain, itching or discharge EARS: Denies change in hearing, ringing in ears, or earache NOSE: Denies nasal congestion or epistaxis MOUTH and THROAT: Denies oral discomfort, gingival pain or bleeding, pain from teeth or hoarseness   RESPIRATORY: no cough, SOB, DOE, wheezing, hemoptysis CARDIAC: no chest pain, edema or palpitations GI: no abdominal pain, diarrhea, constipation, heart burn, nausea or vomiting GU: Denies dysuria, frequency, hematuria, incontinence, or discharge PSYCHIATRIC: Denies feeling of depression or anxiety. No report of hallucinations, insomnia, paranoia, or agitation    PHYSICAL EXAMINATION  GENERAL APPEARANCE: Well nourished. In no acute distress. Normal body habitus SKIN:  Right femur surgical wound has staples intact HEAD: Normal in size and contour. No evidence of trauma EYES: Lids open and close normally. No blepharitis, entropion or ectropion. PERRL. Conjunctivae are clear and sclerae are white. Lenses are without opacity EARS: Pinnae are normal. Patient hears normal voice tunes of the examiner MOUTH and THROAT: Lips are without lesions. Oral mucosa is moist and without  lesions. Tongue is normal in shape, size, and color and without lesions NECK: supple, trachea midline, no neck masses, no thyroid tenderness, no thyromegaly LYMPHATICS: no LAN in the neck, no supraclavicular LAN RESPIRATORY: breathing is even & unlabored, BS CTAB CARDIAC: RRR, no murmur,no extra heart sounds, no edema, right chest pacemaker GI: abdomen soft, normal BS, no masses, no tenderness, no hepatomegaly, no splenomegaly EXTREMITIES:  Able to move X 4 extremities PSYCHIATRIC: Alert and oriented to person and place, disoriented to time. Affect and behavior are appropriate  LABS/RADIOLOGY: Labs reviewed: Basic Metabolic Panel:  Recent Labs  03/19/16 2139  03/21/16 0003 03/22/16 0927 04/04/16 04/09/16  NA 142 137 139 142 145  K 4.2 4.0 3.7 4.1 3.6  CL 108 104 102  --   --   CO2 '26 29 28  '$ --   --   GLUCOSE 106* 118* 161*  --   --   BUN '12 9 12 '$ 35* 64*  CREATININE 0.69 0.72 0.77 0.8 1.3*  CALCIUM 10.1 9.3 9.8  --   --   PHOS  --   --  2.6  --   --    Liver Function Tests:  Recent Labs  04/26/15 0940 09/13/15 1604 03/22/16 0927  AST 16 44*  --   ALT 13 29  --   ALKPHOS 120 89  --   BILITOT 0.6 0.8  --   PROT 6.6 6.6  --   ALBUMIN 4.1 4.0 2.6*   CBC:  Recent Labs  03/21/16 0026 03/22/16 0927  03/23/16 0758 04/04/16 04/09/16  WBC 10.7* 6.8  5.7  --  11.8* 14.5 16.7  NEUTROABS  --  6.1  --   --  12760 14529  HGB 9.7* 8.7*  7.7*  < > 12.1 14.7 15.8  HCT 29.9* 27.1*  23.8*  < > 36.5 45 49*  MCV 93.1 92.8  93.0  --  89.9  --   --   PLT 132* 111*  94*  --  135* 323 290  < > = values in this interval not displayed.  Cardiac Enzymes:  Recent Labs  09/13/15 1604  CKTOTAL 521*     Dg Chest 2 View  03/21/2016  CLINICAL DATA:  Heart failure EXAM: CHEST  2 VIEW COMPARISON:  03/19/2016 FINDINGS: Cardiac shadow is stable. Scarring is again noted in the left upper lung. AE stimulator pack is noted over the right chest wall. The lungs are mildly hyperinflated. No  acute bony abnormality is seen. 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  No acute abnormality noted. Electronically Signed   By: Inez Catalina M.D.   On: 03/21/2016 12:33   Ct Head Wo Contrast  03/20/2016  CLINICAL DATA:  [place] going to get mail. Headache and pain. History of lung cancer, tremor, deep brain stimulator. EXAM: CT HEAD WITHOUT CONTRAST TECHNIQUE: Contiguous axial images were obtained from the base of the skull through the vertex without intravenous contrast. COMPARISON:  CT HEAD September 15, 2015 FINDINGS: INTRACRANIAL CONTENTS: Similar moderate ventriculomegaly on the basis of global parenchymal brain volume loss. Bilateral deep brain stimulator leads terminate in the region of the thalamus. Stable gliosis along the catheter tract. Patchy supratentorial white matter hypodensities exclusive of the aforementioned abnormality. No intraparenchymal hemorrhage, mass effect, midline shift or acute large vascular territory infarcts. Basal cisterns are patent. Mild calcific atherosclerosis of the carotid siphon. ORBITS: The included ocular globes and orbital contents are non-suspicious. SINUSES: Mild RIGHT sphenoid sinusitis. Mastoid air cells are well aerated. SKULL/SOFT TISSUES: No skull fracture. No significant soft tissue swelling. Stimulators leads course in RIGHT neck. IMPRESSION: No acute intracranial process. DBS leads at the level of the bilateral thalamus. Moderate global parenchymal brain volume loss and moderate chronic small vessel ischemic disease. Electronically Signed   By: Elon Alas M.D.   On: 03/20/2016 01:42   Dg Chest Port 1 View  03/19/2016  CLINICAL DATA:  Right hip fracture EXAM: PORTABLE CHEST 1 VIEW COMPARISON:  None. FINDINGS: Curvilinear scarring in the left upper lung. No airspace consolidation. No large effusion. No pneumothorax. Normal pulmonary vasculature. Implanted stimulating device  superimposes the right hemi thorax with leads extending up the right neck.  IMPRESSION: Curvilinear scarring in the upper left lung, treatment related. No acute cardiopulmonary findings. Electronically Signed   By: Andreas Newport M.D.   On: 03/19/2016 22:59   Dg C-arm 1-60 Min  03/20/2016  CLINICAL DATA:  Right femur intra medullary nail. EXAM: DG C-ARM 61-120 MIN; RIGHT FEMUR 2 VIEWS COMPARISON:  Radiography from yesterday FINDINGS: Fluoroscopy shows intra medullary nail fixation of an intertrochanteric right femur fracture. No evidence of intraoperative fracture. Located hip. IMPRESSION: Fluoroscopy for intertrochanteric femur fracture fixation. Electronically Signed   By: Monte Fantasia M.D.   On: 03/20/2016 18:18   Dg Hip Unilat With Pelvis 2-3 Views Right  03/19/2016  CLINICAL DATA:  75 year old who fell and injured the right hip. Initial encounter. EXAM: DG HIP (WITH OR WITHOUT PELVIS) 2-3V RIGHT COMPARISON:  None. FINDINGS: Acute comminuted, mildly displaced intertrochanteric right femoral neck fracture. Hip joint intact with well preserved joint space for age. Included AP pelvis demonstrates a normal-appearing contralateral left hip joint. Sacroiliac joints and symphysis pubis intact. Osseous demineralization. IMPRESSION: Acute traumatic comminuted, mildly displaced intertrochanteric right femoral neck fracture. Electronically Signed   By: Evangeline Dakin M.D.   On: 03/19/2016 21:35   Dg Femur, Min 2 Views Right  03/20/2016  CLINICAL DATA:  Right femur intra medullary nail. EXAM: DG C-ARM 61-120 MIN; RIGHT FEMUR 2 VIEWS COMPARISON:  Radiography from yesterday FINDINGS: Fluoroscopy shows intra medullary nail fixation of an intertrochanteric right femur fracture. No evidence of intraoperative fracture. Located hip. IMPRESSION: Fluoroscopy for intertrochanteric femur fracture fixation. Electronically Signed   By: Monte Fantasia M.D.   On: 03/20/2016 18:18    ASSESSMENT/PLAN:  Acute renal insufficiency - start D5 1/2 NS IV @  80 ml/hr X 1L; BMP after  Leukocytosis  - wbc 16.7 ; no fever and was on Prednisone tapering dose; repeat CBC  Nausea and vomiting - continue Zofran 4 mg 1 tab PO BID and Q 6 hours PRN  Right femoral neck fracture S/P surgical repair - she has been missing her orthopedic appointment due to not feeling well. Call Dr. Berenice Primas' office and ask if staples can be removed @ [place]  Dysphagia - she has been spitting her medications and currently being seen by SLP; crush medications before giving to patient  Anemia, acute blood loss - hgb 15.8; discontinue FeSO4       Durenda Age, NP [employer] [phone]

## 2016-04-11 ENCOUNTER — Encounter (HOSPITAL_COMMUNITY): Payer: Self-pay | Admitting: Emergency Medicine

## 2016-04-11 ENCOUNTER — Inpatient Hospital Stay (HOSPITAL_COMMUNITY): Payer: Medicare Other

## 2016-04-11 ENCOUNTER — Inpatient Hospital Stay (HOSPITAL_COMMUNITY)
Admission: EM | Admit: 2016-04-11 | Discharge: 2016-04-23 | DRG: 388 | Disposition: E | Payer: Medicare Other | Attending: Internal Medicine | Admitting: Internal Medicine

## 2016-04-11 ENCOUNTER — Emergency Department (HOSPITAL_COMMUNITY): Payer: Medicare Other

## 2016-04-11 DIAGNOSIS — R1031 Right lower quadrant pain: Secondary | ICD-10-CM

## 2016-04-11 DIAGNOSIS — Z4682 Encounter for fitting and adjustment of non-vascular catheter: Secondary | ICD-10-CM | POA: Diagnosis not present

## 2016-04-11 DIAGNOSIS — R0682 Tachypnea, not elsewhere classified: Secondary | ICD-10-CM | POA: Diagnosis not present

## 2016-04-11 DIAGNOSIS — Z885 Allergy status to narcotic agent status: Secondary | ICD-10-CM

## 2016-04-11 DIAGNOSIS — E871 Hypo-osmolality and hyponatremia: Secondary | ICD-10-CM | POA: Diagnosis not present

## 2016-04-11 DIAGNOSIS — E870 Hyperosmolality and hypernatremia: Secondary | ICD-10-CM | POA: Diagnosis not present

## 2016-04-11 DIAGNOSIS — M4854XA Collapsed vertebra, not elsewhere classified, thoracic region, initial encounter for fracture: Secondary | ICD-10-CM | POA: Diagnosis not present

## 2016-04-11 DIAGNOSIS — Z79899 Other long term (current) drug therapy: Secondary | ICD-10-CM

## 2016-04-11 DIAGNOSIS — E876 Hypokalemia: Secondary | ICD-10-CM | POA: Diagnosis not present

## 2016-04-11 DIAGNOSIS — K565 Intestinal adhesions [bands] with obstruction (postprocedural) (postinfection): Secondary | ICD-10-CM | POA: Diagnosis not present

## 2016-04-11 DIAGNOSIS — Z66 Do not resuscitate: Secondary | ICD-10-CM | POA: Diagnosis present

## 2016-04-11 DIAGNOSIS — Z8249 Family history of ischemic heart disease and other diseases of the circulatory system: Secondary | ICD-10-CM

## 2016-04-11 DIAGNOSIS — R627 Adult failure to thrive: Secondary | ICD-10-CM | POA: Diagnosis present

## 2016-04-11 DIAGNOSIS — R000 Tachycardia, unspecified: Secondary | ICD-10-CM | POA: Diagnosis not present

## 2016-04-11 DIAGNOSIS — R471 Dysarthria and anarthria: Secondary | ICD-10-CM

## 2016-04-11 DIAGNOSIS — E46 Unspecified protein-calorie malnutrition: Secondary | ICD-10-CM

## 2016-04-11 DIAGNOSIS — I10 Essential (primary) hypertension: Secondary | ICD-10-CM | POA: Diagnosis present

## 2016-04-11 DIAGNOSIS — D72829 Elevated white blood cell count, unspecified: Secondary | ICD-10-CM | POA: Diagnosis present

## 2016-04-11 DIAGNOSIS — Z7951 Long term (current) use of inhaled steroids: Secondary | ICD-10-CM

## 2016-04-11 DIAGNOSIS — R64 Cachexia: Secondary | ICD-10-CM | POA: Diagnosis present

## 2016-04-11 DIAGNOSIS — E43 Unspecified severe protein-calorie malnutrition: Secondary | ICD-10-CM | POA: Diagnosis present

## 2016-04-11 DIAGNOSIS — R0600 Dyspnea, unspecified: Secondary | ICD-10-CM | POA: Diagnosis not present

## 2016-04-11 DIAGNOSIS — G250 Essential tremor: Secondary | ICD-10-CM | POA: Diagnosis not present

## 2016-04-11 DIAGNOSIS — Z515 Encounter for palliative care: Secondary | ICD-10-CM | POA: Insufficient documentation

## 2016-04-11 DIAGNOSIS — R9431 Abnormal electrocardiogram [ECG] [EKG]: Secondary | ICD-10-CM

## 2016-04-11 DIAGNOSIS — I714 Abdominal aortic aneurysm, without rupture, unspecified: Secondary | ICD-10-CM | POA: Diagnosis present

## 2016-04-11 DIAGNOSIS — Z681 Body mass index (BMI) 19 or less, adult: Secondary | ICD-10-CM | POA: Diagnosis not present

## 2016-04-11 DIAGNOSIS — R110 Nausea: Secondary | ICD-10-CM | POA: Insufficient documentation

## 2016-04-11 DIAGNOSIS — C3490 Malignant neoplasm of unspecified part of unspecified bronchus or lung: Secondary | ICD-10-CM | POA: Diagnosis not present

## 2016-04-11 DIAGNOSIS — K5660 Unspecified intestinal obstruction: Secondary | ICD-10-CM | POA: Diagnosis not present

## 2016-04-11 DIAGNOSIS — Z91048 Other nonmedicinal substance allergy status: Secondary | ICD-10-CM

## 2016-04-11 DIAGNOSIS — Z923 Personal history of irradiation: Secondary | ICD-10-CM

## 2016-04-11 DIAGNOSIS — Z809 Family history of malignant neoplasm, unspecified: Secondary | ICD-10-CM | POA: Diagnosis not present

## 2016-04-11 DIAGNOSIS — D649 Anemia, unspecified: Secondary | ICD-10-CM | POA: Diagnosis not present

## 2016-04-11 DIAGNOSIS — Z91013 Allergy to seafood: Secondary | ICD-10-CM

## 2016-04-11 DIAGNOSIS — R571 Hypovolemic shock: Secondary | ICD-10-CM | POA: Diagnosis not present

## 2016-04-11 DIAGNOSIS — Z4659 Encounter for fitting and adjustment of other gastrointestinal appliance and device: Secondary | ICD-10-CM | POA: Diagnosis not present

## 2016-04-11 DIAGNOSIS — I69391 Dysphagia following cerebral infarction: Secondary | ICD-10-CM

## 2016-04-11 DIAGNOSIS — R531 Weakness: Secondary | ICD-10-CM

## 2016-04-11 DIAGNOSIS — R100 Acute abdomen: Secondary | ICD-10-CM | POA: Diagnosis not present

## 2016-04-11 DIAGNOSIS — I69322 Dysarthria following cerebral infarction: Secondary | ICD-10-CM

## 2016-04-11 DIAGNOSIS — R112 Nausea with vomiting, unspecified: Secondary | ICD-10-CM | POA: Diagnosis not present

## 2016-04-11 DIAGNOSIS — Z91010 Allergy to peanuts: Secondary | ICD-10-CM

## 2016-04-11 DIAGNOSIS — J449 Chronic obstructive pulmonary disease, unspecified: Secondary | ICD-10-CM | POA: Diagnosis not present

## 2016-04-11 DIAGNOSIS — K5669 Other intestinal obstruction: Secondary | ICD-10-CM | POA: Diagnosis not present

## 2016-04-11 DIAGNOSIS — Z9221 Personal history of antineoplastic chemotherapy: Secondary | ICD-10-CM

## 2016-04-11 DIAGNOSIS — Z888 Allergy status to other drugs, medicaments and biological substances status: Secondary | ICD-10-CM

## 2016-04-11 DIAGNOSIS — Z7982 Long term (current) use of aspirin: Secondary | ICD-10-CM

## 2016-04-11 DIAGNOSIS — Z87891 Personal history of nicotine dependence: Secondary | ICD-10-CM

## 2016-04-11 DIAGNOSIS — K56609 Unspecified intestinal obstruction, unspecified as to partial versus complete obstruction: Secondary | ICD-10-CM | POA: Diagnosis present

## 2016-04-11 LAB — COMPREHENSIVE METABOLIC PANEL
ALK PHOS: 101 U/L (ref 38–126)
ALT: 30 U/L (ref 14–54)
ANION GAP: 8 (ref 5–15)
AST: 25 U/L (ref 15–41)
Albumin: 2.9 g/dL — ABNORMAL LOW (ref 3.5–5.0)
BILIRUBIN TOTAL: 1.2 mg/dL (ref 0.3–1.2)
BUN: 37 mg/dL — ABNORMAL HIGH (ref 6–20)
CALCIUM: 10 mg/dL (ref 8.9–10.3)
CO2: 29 mmol/L (ref 22–32)
Chloride: 109 mmol/L (ref 101–111)
Creatinine, Ser: 0.86 mg/dL (ref 0.44–1.00)
GFR calc non Af Amer: >60 mL/min (ref >60)
GLUCOSE: 98 mg/dL (ref 65–99)
Potassium: 3.2 mmol/L — ABNORMAL LOW (ref 3.5–5.1)
Sodium: 146 mmol/L — ABNORMAL HIGH (ref 135–145)
TOTAL PROTEIN: 5.2 g/dL — AB (ref 6.5–8.1)

## 2016-04-11 LAB — TYPE AND SCREEN
ABO/RH(D): A POS
ANTIBODY SCREEN: NEGATIVE

## 2016-04-11 LAB — URINALYSIS, ROUTINE W REFLEX MICROSCOPIC
Bilirubin Urine: NEGATIVE
GLUCOSE, UA: NEGATIVE mg/dL
HGB URINE DIPSTICK: NEGATIVE
KETONES UR: NEGATIVE mg/dL
Nitrite: NEGATIVE
PROTEIN: NEGATIVE mg/dL
Specific Gravity, Urine: 1.026 (ref 1.005–1.030)
pH: 5.5 (ref 5.0–8.0)

## 2016-04-11 LAB — CBC WITH DIFFERENTIAL/PLATELET
BASOS ABS: 0 10*3/uL (ref 0.0–0.1)
Basophils Relative: 0 %
Eosinophils Absolute: 0 10*3/uL (ref 0.0–0.7)
Eosinophils Relative: 0 %
HEMATOCRIT: 45 % (ref 36.0–46.0)
HEMOGLOBIN: 14.6 g/dL (ref 12.0–15.0)
LYMPHS PCT: 8 %
Lymphs Abs: 1.1 10*3/uL (ref 0.7–4.0)
MCH: 31 pg (ref 26.0–34.0)
MCHC: 32.4 g/dL (ref 30.0–36.0)
MCV: 95.5 fL (ref 78.0–100.0)
MONO ABS: 1.1 10*3/uL — AB (ref 0.1–1.0)
Monocytes Relative: 8 %
NEUTROS ABS: 11.3 10*3/uL — AB (ref 1.7–7.7)
NEUTROS PCT: 84 %
PLATELETS: 206 10*3/uL (ref 150–400)
RBC: 4.71 MIL/uL (ref 3.87–5.11)
RDW: 15 % (ref 11.5–15.5)
WBC: 13.4 10*3/uL — ABNORMAL HIGH (ref 4.0–10.5)

## 2016-04-11 LAB — HEMOGLOBIN AND HEMATOCRIT, BLOOD
HCT: 46.3 % — ABNORMAL HIGH (ref 36.0–46.0)
HEMOGLOBIN: 14.6 g/dL (ref 12.0–15.0)

## 2016-04-11 LAB — URINE MICROSCOPIC-ADD ON

## 2016-04-11 LAB — I-STAT TROPONIN, ED: Troponin i, poc: 0.01 ng/mL (ref 0.00–0.08)

## 2016-04-11 LAB — LIPASE, BLOOD: Lipase: 59 U/L — ABNORMAL HIGH (ref 11–51)

## 2016-04-11 LAB — OCCULT BLOOD GASTRIC / DUODENUM (SPECIMEN CUP): Occult Blood, Gastric: NEGATIVE

## 2016-04-11 LAB — I-STAT CG4 LACTIC ACID, ED: LACTIC ACID, VENOUS: 1.58 mmol/L (ref 0.5–1.9)

## 2016-04-11 MED ORDER — ONDANSETRON HCL 4 MG/2ML IJ SOLN
4.0000 mg | Freq: Four times a day (QID) | INTRAMUSCULAR | Status: DC | PRN
  Administered 2016-04-13 – 2016-04-17 (×2): 4 mg via INTRAVENOUS
  Filled 2016-04-11 (×2): qty 2

## 2016-04-11 MED ORDER — PROCHLORPERAZINE EDISYLATE 5 MG/ML IJ SOLN
10.0000 mg | Freq: Once | INTRAMUSCULAR | Status: AC
  Administered 2016-04-11: 10 mg via INTRAVENOUS
  Filled 2016-04-11: qty 2

## 2016-04-11 MED ORDER — PANTOPRAZOLE SODIUM 40 MG IV SOLR: 40.0000 mg | Freq: Two times a day (BID) | INTRAVENOUS | Status: DC

## 2016-04-11 MED ORDER — SODIUM CHLORIDE 0.9 % IV BOLUS (SEPSIS)
500.0000 mL | Freq: Once | INTRAVENOUS | Status: AC
  Administered 2016-04-11: 500 mL via INTRAVENOUS

## 2016-04-11 MED ORDER — BARIUM SULFATE 2.1 % PO SUSP
ORAL | Status: AC
  Filled 2016-04-11: qty 2

## 2016-04-11 MED ORDER — ARFORMOTEROL TARTRATE 15 MCG/2ML IN NEBU
15.0000 ug | INHALATION_SOLUTION | Freq: Two times a day (BID) | RESPIRATORY_TRACT | Status: DC
  Administered 2016-04-11: 15 ug via RESPIRATORY_TRACT
  Filled 2016-04-11 (×2): qty 2

## 2016-04-11 MED ORDER — ONDANSETRON HCL 4 MG PO TABS: 4.0000 mg | ORAL_TABLET | Freq: Four times a day (QID) | ORAL | Status: DC | PRN

## 2016-04-11 MED ORDER — SODIUM CHLORIDE 0.9 % IV SOLN
Freq: Once | INTRAVENOUS | Status: AC
  Administered 2016-04-11: 20:00:00 via INTRAVENOUS

## 2016-04-11 MED ORDER — ENOXAPARIN SODIUM 40 MG/0.4ML ~~LOC~~ SOLN: 40.0000 mg | SUBCUTANEOUS | Status: DC

## 2016-04-11 MED ORDER — IOPAMIDOL (ISOVUE-300) INJECTION 61%
INTRAVENOUS | Status: AC
  Filled 2016-04-11: qty 100

## 2016-04-11 MED ORDER — FLUCONAZOLE IN SODIUM CHLORIDE 200-0.9 MG/100ML-% IV SOLN
200.0000 mg | Freq: Once | INTRAVENOUS | Status: AC
  Administered 2016-04-11: 200 mg via INTRAVENOUS
  Filled 2016-04-11: qty 100

## 2016-04-11 MED ORDER — SODIUM CHLORIDE 0.9 % IV SOLN
80.0000 mg | Freq: Once | INTRAVENOUS | Status: DC
  Filled 2016-04-11: qty 80

## 2016-04-11 MED ORDER — IPRATROPIUM-ALBUTEROL 0.5-2.5 (3) MG/3ML IN SOLN: 3.0000 mL | RESPIRATORY_TRACT | Status: DC | PRN

## 2016-04-11 MED ORDER — SODIUM CHLORIDE 0.9% FLUSH
3.0000 mL | Freq: Two times a day (BID) | INTRAVENOUS | Status: DC
  Administered 2016-04-11: 3 mL via INTRAVENOUS

## 2016-04-11 MED ORDER — POTASSIUM CHLORIDE 10 MEQ/100ML IV SOLN
10.0000 meq | INTRAVENOUS | Status: AC
  Administered 2016-04-11 (×4): 10 meq via INTRAVENOUS
  Filled 2016-04-11 (×4): qty 100

## 2016-04-11 MED ORDER — FAMOTIDINE IN NACL 20-0.9 MG/50ML-% IV SOLN
20.0000 mg | Freq: Two times a day (BID) | INTRAVENOUS | Status: DC
  Administered 2016-04-12: 20 mg via INTRAVENOUS
  Filled 2016-04-11: qty 50

## 2016-04-11 MED ORDER — ONDANSETRON HCL 4 MG/2ML IJ SOLN: 4.0000 mg | Freq: Once | INTRAMUSCULAR | Status: DC

## 2016-04-11 MED ORDER — SODIUM CHLORIDE 0.9 % IV SOLN
8.0000 mg/h | INTRAVENOUS | Status: DC
  Filled 2016-04-11: qty 80

## 2016-04-11 MED ORDER — BUDESONIDE 0.5 MG/2ML IN SUSP
0.5000 mg | Freq: Two times a day (BID) | RESPIRATORY_TRACT | Status: DC
  Administered 2016-04-11: 0.5 mg via RESPIRATORY_TRACT
  Filled 2016-04-11: qty 2

## 2016-04-11 MED ORDER — SODIUM CHLORIDE 0.9 % IV SOLN
INTRAVENOUS | Status: DC
  Administered 2016-04-11: 21:00:00 via INTRAVENOUS

## 2016-04-11 NOTE — ED Notes (Signed)
Dr. Smith at bedside.

## 2016-04-11 NOTE — ED Provider Notes (Signed)
Care assumed from [facility].  Patient discussed, care assumed. Troponin negative. CT scan shows small bowel obstruction. Past surgical history includes previous open appendectomy and apparently laparoscopic GYN surgery. Discussed the case with [facility] hospitalist, she'll be admitted. Also discussed with Doreen Salvage,  general surgery. He will see the patient in consult. NG tube is being placed. MedSurg bed is been ordered.  Tanna Furry, MD 04/22/2016 2013

## 2016-04-11 NOTE — ED Notes (Signed)
Patient transported to CT 

## 2016-04-11 NOTE — ED Provider Notes (Signed)
CSN: [id number]     Arrival date & time 04/13/2016  1116 History   First MD Initiated Contact with Patient 03/28/2016 1132     Chief Complaint  Patient presents with  . Abdominal Pain  . Weakness     (Consider location/radiation/quality/duration/timing/severity/associated sxs/prior Treatment) The history is provided by the patient and medical records. No language interpreter was used.     Sherri Singh is a 75 y.o. 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  Pt denies N/V, fevers.  She is fully alert, but has significant difficulty articulating answers.  She reports multiple days without a bowel movement.    Record reports pt was admitted on 6/27 for right hip fracture after fall and was d/c to [facility] and rehab on 03/23/16.     LEVEL 5 Caveat for dysarthria.  Discussed with Sherri Singh who reports 1 week of abd discomfort with nausea and vomiting last week (but none this week, though her c/o abd pain has persisted).  He reports decreasing appetite since she arrived.  NP added zofran without increased appetite.  Labwork obtained at the facility showed negative CXR, UA and unremarkable labs aside from a leukocytosis.  Pt's son wanted her transported for further evaluation. Pt is normally A&Ox4, but slow to respond.  She is able to feed herself with pureed food and pills crushed. Pt is full code.    Sherri Singh (son) wanted her transported for further work-up.   Past Medical History  Diagnosis Date  . Lung cancer ([place])     lung ca dx 04/2009  . Benign essential tremor   . Lung cancer ([place])   . COPD (chronic obstructive pulmonary disease) [facility])    Past Surgical History  Procedure Laterality Date  . Deep brain stimulator placement    . Mouth surgery    . Breast surgery    . Appendectomy    . Ovary surgery    . Femur im nail Right 03/20/2016     Procedure: INTRAMEDULLARY (IM) NAIL FEMORAL;  Surgeon: Dorna Leitz, MD;  Location: [place];  Service: Orthopedics;  Laterality: Right;   Family History  Problem Relation Age of Onset  . Cancer Mother     lung  . Heart attack Mother   . Diabetes Mother    Social History  Substance Use Topics  . Smoking status: Former Smoker -- 96 years    Quit date: 09/23/1997  . Smokeless tobacco: None  . Alcohol Use: No   OB History    Gravida Para Term Preterm AB TAB SAB Ectopic Multiple Living   '4 4 4       4     '$ Review of Systems  Constitutional: Negative for fever, diaphoresis, appetite change, fatigue and unexpected weight change.  HENT: Negative for mouth sores.   Eyes: Negative for visual disturbance.  Respiratory: Negative for cough, chest tightness, shortness of breath and wheezing.   Cardiovascular: Negative for chest pain.  Gastrointestinal: Positive for nausea, vomiting and abdominal pain. Negative for diarrhea and constipation.  Endocrine: Negative for polydipsia, polyphagia and polyuria.  Genitourinary: Negative for dysuria, urgency, frequency and hematuria.  Musculoskeletal: Negative for back pain and neck stiffness.  Skin: Negative for rash.  Allergic/Immunologic: Negative for immunocompromised state.  Neurological: Positive for weakness. Negative for syncope, light-headedness and headaches.  Hematological: Does not bruise/bleed easily.  Psychiatric/Behavioral: Negative for sleep disturbance. The patient is  not nervous/anxious.       Allergies  Codeine; Other; Budesonide-formoterol fumarate; Iohexol; Morphine; Peanut-containing drug products; Shellfish-derived products; and Iodine  Home Medications   Prior to Admission medications   Medication Sig Start Date End Date Taking? Authorizing Provider  aspirin EC 325 MG tablet Take 1 tablet (325 mg total) by mouth daily. Take x 1 month post op to decrease risk of blood clots. 03/20/16  Yes Gary Fleet, PA-C   budesonide-formoterol (SYMBICORT) 160-4.5 MCG/ACT inhaler Inhale 2 puffs into the lungs 2 (two) times daily. 11/07/15  Yes Gregor Hams, MD  Cholecalciferol (VITAMIN D PO) Take 1 tablet by mouth daily. D3   Yes Historical Provider, MD  docusate sodium (COLACE) 100 MG capsule Take 1 capsule (100 mg total) by mouth 2 (two) times daily. 03/23/16  Yes Bonnell Public, MD  HYDROcodone-acetaminophen (NORCO) 5-325 MG tablet Take 1-2 tablets by mouth every 6 (six) hours as needed for severe pain. 03/25/16  Yes Tiffany L Reed, DO  ipratropium-albuterol (DUONEB) 0.5-2.5 (3) MG/3ML SOLN Take 3 mLs by nebulization every 8 (eight) hours as needed (Dyspnea or wheezing).   Yes Historical Provider, MD  methocarbamol (ROBAXIN) 500 MG tablet Take 500 mg by mouth every 8 (eight) hours as needed for muscle spasms.   Yes Historical Provider, MD  metoprolol (LOPRESSOR) 100 MG tablet Take 1 tablet (100 mg total) by mouth 2 (two) times daily. 03/23/16  Yes Bonnell Public, MD  ondansetron (ZOFRAN) 4 MG tablet Take 4 mg by mouth every 6 (six) hours as needed for nausea.   Yes Historical Provider, MD  ondansetron (ZOFRAN) 4 MG tablet Take 4 mg by mouth 2 (two) times daily. Give before breakfast and lunch   Yes Historical Provider, MD  polyethylene glycol (MIRALAX / GLYCOLAX) packet Take 17 g by mouth daily as needed for mild constipation. 03/23/16  Yes Bonnell Public, MD  ferrous sulfate 325 (65 FE) MG tablet Take 1 tablet (325 mg total) by mouth 2 (two) times daily with a meal. Patient not taking: Reported on 04/15/2016 03/23/16   Bonnell Public, MD   BP 134/69 mmHg  Pulse 107  Temp(Src) 97.3 F (36.3 C) (Oral)  Resp 23  SpO2 97% Physical Exam  Constitutional: She appears well-developed and well-nourished. No distress.  Awake, alert, nontoxic appearance  HENT:  Head: Normocephalic and atraumatic.  Mouth/Throat: Oropharynx is clear and moist. No oropharyngeal exudate.  Eyes: Conjunctivae are normal. No scleral  icterus.  Neck: Normal range of motion. Neck supple.  Cardiovascular: Normal rate, regular rhythm, normal heart sounds and intact distal pulses.   No murmur heard. Pulmonary/Chest: Effort normal and breath sounds normal. No respiratory distress. She has no wheezes.  Equal chest expansion  Abdominal: Soft. Bowel sounds are normal. She exhibits no mass. There is tenderness in the right lower quadrant and suprapubic area. There is no rebound, no guarding and no CVA tenderness.  Musculoskeletal: Normal range of motion. She exhibits no edema.  Right hip with bandage in place, but visible portion of the incision is without erythema or drainage; staples in place.    Neurological: She is alert.  Dysarthric speech Moves extremities without ataxia  Skin: Skin is warm and dry. She is not diaphoretic.  Psychiatric: She has a normal mood and affect.  Nursing note and vitals reviewed.   ED Course  Procedures (including critical care time) Labs Review Labs Reviewed  CBC WITH DIFFERENTIAL/PLATELET - Abnormal; Notable for the following:    WBC 13.4 (*)  Neutro Abs 11.3 (*)    Monocytes Absolute 1.1 (*)    All other components within normal limits  URINALYSIS, ROUTINE W REFLEX MICROSCOPIC (NOT AT [facility]) - Abnormal; Notable for the following:    APPearance HAZY (*)    Leukocytes, UA TRACE (*)    All other components within normal limits  URINE MICROSCOPIC-ADD ON - Abnormal; Notable for the following:    Squamous Epithelial / LPF 0-5 (*)    Bacteria, UA FEW (*)    Casts HYALINE CASTS (*)    All other components within normal limits  COMPREHENSIVE METABOLIC PANEL  LIPASE, BLOOD  I-STAT CG4 LACTIC ACID, ED  I-STAT TROPOININ, ED    Imaging Review No results found. I have personally reviewed and evaluated these images and lab results as part of my medical decision-making.   EKG Interpretation   Date/Time:  Thursday April 11 2016 15:09:44 EDT Ventricular Rate:  107 PR Interval:  130 QRS  Duration: 110 QT Interval:  378 QTC Calculation: 504 R Axis:   -72 Text Interpretation:  Sinus tachycardia Possible Left atrial enlargement  Left axis deviation Pulmonary disease pattern Right bundle branch block  Inferior infarct , age undetermined T wave abnormality, consider lateral  ischemia Abnormal ECG Interpretation limited secondary to artifact  Confirmed by KNOTT MD, Sherri Singh ([id number]) on 04/21/2016 3:13:24 PM      MDM   Final diagnoses:  RLQ abdominal pain  Abnormal ECG  Generalized weakness  Protein-calorie malnutrition ([place])  Dysarthria   Trinda Pascal presents with generalized weakness and abdominal pain for almost 2 weeks.  Nausea and vomiting seemed to have resolved. She has right lower quadrant and suprapubic tenderness on exam. She is tachycardic but without hypotension. Patient is afebrile. No evidence of sepsis. She does have a leukocytosis of 13.4. Lactic acid is normal. No evidence of a UTI. Troponin is negative.  EKG with new and worsening lateral ischemia. Patient denies chest pain however persistent generalized weakness is of concern.  Pt is several weeks s/p right hip repair with clean edges and no drainage.  CT scan is pending.  4:27 PM At shift change, care transferred to Dr. Jeneen Rinks who will follow, re-evaluate and plan for admission for trending troponin and continued generalized weakness work-up.  Family has not been at bedside.    Jarrett Soho Pocahontas Cohenour, PA-C 03/28/2016 [place], MD 04/15/2016 [phone]

## 2016-04-11 NOTE — ED Notes (Addendum)
Pt refusing to finish oral contrast, MD notified. Dr. Jeneen Rinks wants to proceed with CT.

## 2016-04-11 NOTE — H&P (Signed)
History and Physical    Sherri Singh HUT:[id number] DOB: April 20, 1941 DOA: 04/02/2016  Referring MD/NP/PA: Dr. Tanna Furry PCP: Lynne Leader, MD  Patient coming from: [facility] [place] [facility]  Chief Complaint:  Abdomen pain  HPI: Sherri Singh is a 75 y.o. female with medical history significant of hypertension, CVA with dysarthria, COPD, former smoker, SCLC (s/p of XRT and chemotherapy, in remission), essential tremor s/p deep brain stimulator, aortic regurgitation; who  presents with complaints of abdominal pain and generalized weakness. History is obtained from her son Delfino Lovett as the patient has significant dysarthria and is unable to provide her own history at this time.son notes that his mother had gone to [facility] on 7/1, after she had fallen fractured her right hip. Surgery was done by Dr. Berenice Primas on 6/28. Patient appeared to be doing well the initial week actively participating with therapy sessions. However, over the last 10 days she has had a progressive decline. Last week the patient was noted to have complaint of abdominal discomfort and had multiple episodes of nausea and vomiting. This week she has not had any nausea or vomiting but had continued to complain of abdominal pain. Speech therapy have been trying to work with her with her swallowing she was holding food and drink in her mouth. Her appetite significantly decreased and they noted that she had not had a bowel movement in several days. Facility staff had tried to add Zofran and at one point had given her IV fluids without any change in overall appetite. They also obtain lab work, chest x-ray and UA. Lab work showed a leukocytosis, but chest x-ray and UA appeared clear. Son noted that she just did not appear to be doing well and had gotten significantly weak.   ED Course: On admission into the emergency department patient is evaluated seen to be afebrile, heart rates up to 116, respirations up to 28, blood pressure and O2  saturations maintained. Lab work revealed WBC 13.4, sodium 146, potassium 3.2, chloride 109, CO2 29, BUN 37, creatinine 0.86, glucose 98, Iactic acid 1.58. CT of the abdomen showed a mid to distal small bowel obstruction, T12 compression fracture likely from previous fall, and a 2.3 centimeter infrarenal abdominal aortic aneurysm. Urinalysis was positive for yeast. General surgery was consulted by the ED . Patient was ordered to have NG tube to suction and TRH called to admit.  Review of Systems: Unable to obtain due to the patient being acutely altered  Past Medical History  Diagnosis Date  . Lung cancer ([place])     lung ca dx 04/2009  . Benign essential tremor   . Lung cancer ([place])   . COPD (chronic obstructive pulmonary disease) [facility])     Past Surgical History  Procedure Laterality Date  . Deep brain stimulator placement    . Mouth surgery    . Breast surgery    . Appendectomy    . Ovary surgery    . Femur im nail Right 03/20/2016    Procedure: INTRAMEDULLARY (IM) NAIL FEMORAL;  Surgeon: Dorna Leitz, MD;  Location: [facility];  Service: Orthopedics;  Laterality: Right;     reports that she quit smoking about 18 years ago. She does not have any smokeless tobacco history on file. She reports that she does not drink alcohol or use illicit drugs.  Allergies  Allergen Reactions  . Codeine Other (See Comments)  . Other Other (See Comments)    Peanuts, Causes headaches Allergic to a type of pain medication,  unable to recall name of medication at this time  . Budesonide-Formoterol Fumarate     REACTION: hoarseness  . Iohexol      Code: HIVES, Desc: pt developed hives 30+ yrs ago; needs 13 hour prep in future per Dr Leonia Reeves; 06/26/09 slg   . Morphine Other (See Comments)    Unsure of Reaction  . Peanut-Containing Drug Products Other (See Comments)    unknown  . Shellfish-Derived Products Other (See Comments)    Positive allergy test- has never had a reaction  . Iodine Rash    Boils on  skin    Family History  Problem Relation Age of Onset  . Cancer Mother     lung  . Heart attack Mother   . Diabetes Mother     Prior to Admission medications   Medication Sig Start Date End Date Taking? Authorizing Provider  aspirin EC 325 MG tablet Take 1 tablet (325 mg total) by mouth daily. Take x 1 month post op to decrease risk of blood clots. 03/20/16  Yes Gary Fleet, PA-C  budesonide-formoterol (SYMBICORT) 160-4.5 MCG/ACT inhaler Inhale 2 puffs into the lungs 2 (two) times daily. 11/07/15  Yes Gregor Hams, MD  Cholecalciferol (VITAMIN D PO) Take 1 tablet by mouth daily. D3   Yes Historical Provider, MD  docusate sodium (COLACE) 100 MG capsule Take 1 capsule (100 mg total) by mouth 2 (two) times daily. 03/23/16  Yes Bonnell Public, MD  HYDROcodone-acetaminophen (NORCO) 5-325 MG tablet Take 1-2 tablets by mouth every 6 (six) hours as needed for severe pain. 03/25/16  Yes Tiffany L Reed, DO  ipratropium-albuterol (DUONEB) 0.5-2.5 (3) MG/3ML SOLN Take 3 mLs by nebulization every 8 (eight) hours as needed (Dyspnea or wheezing).   Yes Historical Provider, MD  methocarbamol (ROBAXIN) 500 MG tablet Take 500 mg by mouth every 8 (eight) hours as needed for muscle spasms.   Yes Historical Provider, MD  metoprolol (LOPRESSOR) 100 MG tablet Take 1 tablet (100 mg total) by mouth 2 (two) times daily. 03/23/16  Yes Bonnell Public, MD  ondansetron (ZOFRAN) 4 MG tablet Take 4 mg by mouth every 6 (six) hours as needed for nausea.   Yes Historical Provider, MD  ondansetron (ZOFRAN) 4 MG tablet Take 4 mg by mouth 2 (two) times daily. Give before breakfast and lunch   Yes Historical Provider, MD  polyethylene glycol (MIRALAX / GLYCOLAX) packet Take 17 g by mouth daily as needed for mild constipation. 03/23/16  Yes Bonnell Public, MD  ferrous sulfate 325 (65 FE) MG tablet Take 1 tablet (325 mg total) by mouth 2 (two) times daily with a meal. Patient not taking: Reported on 03/29/2016 03/23/16   Bonnell Public, MD    Physical Exam:    Constitutional: Elderly female who is cachectic and appears appears very ill.  Filed Vitals:   04/16/2016 1715 04/05/2016 1730 03/31/2016 1830 04/01/2016 1845  BP: 123/49 125/59 126/62 117/61  Pulse: 102 99 110 116  Temp:      TempSrc:      Resp: '18 18 21 23  '$ SpO2: 97% 97% 95% 94%   Eyes: PERRL, lids and conjunctivae normal ENMT: Mucous membranes are dry. Posterior pharynx clear of any exudate or lesions.Normal dentition.  Neck: normal, supple, no masses, no thyromegaly Respiratory: clear to auscultation bilaterally, no wheezing, no crackles. Normal respiratory effort. No accessory muscle use.  Cardiovascular: tachycardia positive SEM. No lower extremity edema. Patient has a large box of the right chest  wall that son confirms this is a deep brain stimulator for tremor. Abdomen: generalized tenderness to palpation, no masses palpated. No hepatosplenomegaly. Bowel sounds significantly decreased. Musculoskeletal: no clubbing / cyanosis. No joint deformity upper and lower extremities. Good ROM, no contractures. Normal muscle tone.  Skin: no rashes, lesions, ulcers. No induration tenting present Neurologic: CN 2-12 grossly intact. Strength 3/5 in all 4.  Psychiatric: Unable to understand speech. Patient is Alert, but unable to ascertain if oriented.     Labs on Admission: I have personally reviewed following labs and imaging studies  CBC:  Recent Labs Lab 04/09/16 03/31/2016 1140  WBC 16.7 13.4*  NEUTROABS 14529 11.3*  HGB 15.8 14.6  HCT 49* 45.0  MCV  --  95.5  PLT 290 536   Basic Metabolic Panel:  Recent Labs Lab 04/09/16 04/12/2016 1421  NA 145 146*  K 3.6 3.2*  CL  --  109  CO2  --  29  GLUCOSE  --  98  BUN 64* 37*  CREATININE 1.3* 0.86  CALCIUM  --  10.0   GFR: Estimated Creatinine Clearance: 43.6 mL/min (by C-G formula based on Cr of 0.86). Liver Function Tests:  Recent Labs Lab 04/16/2016 1421  AST 25  ALT 30  ALKPHOS 101    BILITOT 1.2  PROT 5.2*  ALBUMIN 2.9*    Recent Labs Lab 04/04/2016 1421  LIPASE 59*   No results for input(s): AMMONIA in the last 168 hours. Coagulation Profile: No results for input(s): INR, PROTIME in the last 168 hours. Cardiac Enzymes: No results for input(s): CKTOTAL, CKMB, CKMBINDEX, TROPONINI in the last 168 hours. BNP (last 3 results)  Recent Labs  04/26/15 0940  PROBNP 228.70*   HbA1C: No results for input(s): HGBA1C in the last 72 hours. CBG: No results for input(s): GLUCAP in the last 168 hours. Lipid Profile: No results for input(s): CHOL, HDL, LDLCALC, TRIG, CHOLHDL, LDLDIRECT in the last 72 hours. Thyroid Function Tests: No results for input(s): TSH, T4TOTAL, FREET4, T3FREE, THYROIDAB in the last 72 hours. Anemia Panel: No results for input(s): VITAMINB12, FOLATE, FERRITIN, TIBC, IRON, RETICCTPCT in the last 72 hours. Urine analysis:    Component Value Date/Time   COLORURINE YELLOW 04/14/2016 1521   APPEARANCEUR HAZY* 04/06/2016 1521   LABSPEC 1.026 03/31/2016 1521   PHURINE 5.5 04/04/2016 1521   GLUCOSEU NEGATIVE 04/22/2016 1521   HGBUR NEGATIVE 04/22/2016 1521   BILIRUBINUR NEGATIVE 03/25/2016 1521   KETONESUR NEGATIVE 03/23/2016 1521   PROTEINUR NEGATIVE 04/22/2016 1521   UROBILINOGEN 0.2 05/02/2012 0055   NITRITE NEGATIVE 04/21/2016 1521   LEUKOCYTESUR TRACE* 04/08/2016 1521   Sepsis Labs: No results found for this or any previous visit (from the past 240 hour(s)).   Radiological Exams on Admission: Ct Abdomen Pelvis Wo Contrast  04/17/2016  CLINICAL DATA:  Progressive diffuse abdominal pain with nausea and vomiting for 1 week. Leukocytosis. Personal history of left small cell lung carcinoma. EXAM: CT ABDOMEN AND PELVIS WITHOUT CONTRAST TECHNIQUE: Multidetector CT imaging of the abdomen and pelvis was performed following the standard protocol without IV contrast. COMPARISON:  PET-CT on 05/02/2009 FINDINGS: Lower chest:  No acute findings.  Hepatobiliary: No mass visualized on this un-enhanced exam. Small cyst in dome of right hepatic lobe is stable. Gallbladder is unremarkable. Pancreas: No mass or inflammatory process identified on this un-enhanced exam. Spleen: Within normal limits in size. Adrenals/Urinary Tract: Stable small low-attenuation left adrenal mass is consistent with a benign adenoma. Fluid attenuation renal cysts again seen in both  kidneys. Large cyst in the right kidney and shows a thin calcified septation, without change in appearance since previous study. No evidence of ureteral calculi or dilatation. A few tiny calculi are seen along the left posterior bladder wall. Stomach/Bowel: Stomach, proximal, and mid small bowel loops are moderately dilated, with transition point to nondilated distal small bowel loops seen in the right posterior pelvis adjacent to right iliac vessels on image 58/series 201. Surgical clips in are seen in the pelvis and this most likely represents an adhesion. No definite bowel wall thickening or mass seen. Diverticulosis is seen involving the descending sigmoid colon, however there is no evidence of diverticulitis. Vascular/Lymphatic: No pathologically enlarged lymph nodes. Saccular aneurysm of the infrarenal abdominal aorta seen measuring 3.2 cm in maximum diameter. This is increased from 2.8 cm in 2010. Reproductive: No mass or other significant abnormality. Other: None. Musculoskeletal: No suspicious bone lesions identified. Benign-appearing compression fracture of the T12 vertebral body is seen, which may be acute or subacute in age, and is new compared to prior lumbar spine radiographs on 11/13/2014. Internal fixation hardware seen within the right hip. IMPRESSION: Mid to distal small bowel obstruction with transition point in the right posterior pelvis adjacent to right iliac vessels, likely due to adhesion. Colonic diverticulosis. No radiographic evidence of diverticulitis. Tiny calculi within urinary  bladder. No evidence of urolithiasis or hydronephrosis. Benign appearing T12 vertebral body compression fracture, new compared to previous lumbar spine radiographs on 11/13/2014. 3.2 cm infrarenal abdominal aortic aneurysm. Recommend followup by ultrasound in 3 years. This recommendation follows ACR consensus guidelines: White Paper of the ACR Incidental Findings Committee II on Vascular Findings. Nat[facility] Coll Radiol 2013; 10:789-794 Electronically Signed   By: Earle Gell M.D.   On: 04/17/2016 18:21    EKG: Independently reviewed.   Assessment/Plan Adendum: After initial evaluation patient had subsequent decline with hypotension secondary to inability to obtain IV access. Patient was noted to wax and and out of consciousness. Tried escalating care per family's request with the addition of dopamine. However, qverall prognosis was noted to be very poor. Critical care called to evaluate the patient and patient was made comfort care measures only.  Small bowel obstruction/abdominal pain, nausea, and vomiting: Acute. Patient presents with approximately 10 day history of complaints of abdominal pain, nausea, and vomiting. CT imaging showing a mid to distal small bowel loops to be secondary to the effusion. Risk factors include previous surgeries(appendectomy and ovary), opoid pain medication, and immobility with recent hip surgery. - Admit to MedSurg bed - NPO - Continue NG tube to suction - IV fluids of normal saline at 100 mL per hour - surgery consulted   Leukocytosis: Acute. WBC 13.4 on admission. Suspect that symptoms could be secondary to the acute bowel obstruction. - check blood cultures - Empiric antibiotics of vancomycin and Zosyn for now, no clear source of infection - Recheck CBC in a.m.  Hypernatremia 2/2 Dehydration: Acute. Decreased oral intake over the last week. Sodium mildly elevated at 146. - IV fluids - will recheck BMP in a.m.   Sinus tachycardia: Acute. Heart rates as high as  122 admission. Suspect likely secondary to dehydration. - administering IV Fluids and will continue to monitor  Essential hypertension - Will monitor for now and will add on IV metoprolol if need  History of CVA with residual dysarthria and dysphagia: Patient has significant dysarthria. -  Will need to be evaluated by PT/speech therapy once acute issue is resolved  Hypokalemia: Acute. Potassium 3.2  on admission. - Potassium chloride IV 30 mEq - continue monitoring and replace as needed  Yeast infection - Follow-up urine culture - Fluconazole IV 1 dose now   AAA: acute. Incidental finding on the day 3.2 cm  infrarenal abdominal aortic aneurysm seen on CT scan of the abdomen.  - Outpatient follow-up and monitoring with ultrasound   GERD prophylaxis: Pepcid DVT prophylaxis: Lovenox and SCD  Code Status:  Full Family Communication: Disposition  Disposition Plan: Possible discharge home 1-3 days Consults called: Surgery Admission status: Medsurg Inpatient  Norval Morton MD [facility] Pager [phone]  If 7PM-7AM, please contact night-coverage [email or web address] Password [email or web address]  04/06/2016, 7:20 PM

## 2016-04-11 NOTE — ED Notes (Signed)
Pt in from [facility] via [facility] with c/o weakness, fatigue and abd pain that began 2 weeks ago after R Hip surgery. Per EMS, pt has been increasingly lethargic at facility. Pt vomited yesterday, received 1L NS bolus at [facility], last Hg per staff was 15.8. Hx of anemia, COPD, HTN, pacemaker. Pt a&ox4, sats 97% on RA.

## 2016-04-11 NOTE — ED Notes (Signed)
Attempted to call report

## 2016-04-11 NOTE — ED Notes (Addendum)
Richard (son's) # [phone]

## 2016-04-11 NOTE — ED Notes (Signed)
Dr. Tamala Julian states that the pts implant in her right upper chest is a "deep brain stimulator for her tremors".

## 2016-04-12 DIAGNOSIS — R471 Dysarthria and anarthria: Secondary | ICD-10-CM | POA: Insufficient documentation

## 2016-04-12 DIAGNOSIS — K5669 Other intestinal obstruction: Secondary | ICD-10-CM

## 2016-04-12 DIAGNOSIS — I714 Abdominal aortic aneurysm, without rupture, unspecified: Secondary | ICD-10-CM | POA: Diagnosis present

## 2016-04-12 DIAGNOSIS — R9431 Abnormal electrocardiogram [ECG] [EKG]: Secondary | ICD-10-CM

## 2016-04-12 DIAGNOSIS — Z66 Do not resuscitate: Secondary | ICD-10-CM

## 2016-04-12 DIAGNOSIS — R0600 Dyspnea, unspecified: Secondary | ICD-10-CM

## 2016-04-12 DIAGNOSIS — Z515 Encounter for palliative care: Secondary | ICD-10-CM

## 2016-04-12 DIAGNOSIS — Z4659 Encounter for fitting and adjustment of other gastrointestinal appliance and device: Secondary | ICD-10-CM

## 2016-04-12 DIAGNOSIS — E870 Hyperosmolality and hypernatremia: Secondary | ICD-10-CM | POA: Diagnosis present

## 2016-04-12 DIAGNOSIS — R531 Weakness: Secondary | ICD-10-CM

## 2016-04-12 DIAGNOSIS — R110 Nausea: Secondary | ICD-10-CM

## 2016-04-12 DIAGNOSIS — I10 Essential (primary) hypertension: Secondary | ICD-10-CM | POA: Diagnosis present

## 2016-04-12 LAB — MRSA PCR SCREENING: MRSA by PCR: NEGATIVE

## 2016-04-12 MED ORDER — VANCOMYCIN HCL IN DEXTROSE 1-5 GM/200ML-% IV SOLN
1000.0000 mg | INTRAVENOUS | Status: AC
  Administered 2016-04-12: 1000 mg via INTRAVENOUS
  Filled 2016-04-12: qty 200

## 2016-04-12 MED ORDER — DOPAMINE-DEXTROSE 3.2-5 MG/ML-% IV SOLN
0.0000 ug/kg/min | INTRAVENOUS | Status: DC | PRN
  Administered 2016-04-12: 5 ug/kg/min via INTRAVENOUS
  Filled 2016-04-12: qty 250

## 2016-04-12 MED ORDER — SCOPOLAMINE 1 MG/3DAYS TD PT72
1.0000 | MEDICATED_PATCH | TRANSDERMAL | Status: DC
Start: 2016-04-12 — End: 2016-04-19
  Administered 2016-04-12 – 2016-04-15 (×2): 1.5 mg via TRANSDERMAL
  Filled 2016-04-12 (×3): qty 1

## 2016-04-12 MED ORDER — MORPHINE SULFATE 25 MG/ML IV SOLN
3.0000 mg/h | INTRAVENOUS | Status: DC
  Administered 2016-04-12: 10 mg/h via INTRAVENOUS
  Administered 2016-04-14: 1 mg/h via INTRAVENOUS
  Filled 2016-04-12 (×2): qty 10

## 2016-04-12 MED ORDER — SODIUM CHLORIDE 0.9 % IV SOLN
Freq: Once | INTRAVENOUS | Status: AC
  Administered 2016-04-12: 02:00:00 via INTRAVENOUS

## 2016-04-12 MED ORDER — GLYCOPYRROLATE 0.2 MG/ML IJ SOLN
0.2000 mg | INTRAMUSCULAR | Status: DC | PRN
  Administered 2016-04-12 – 2016-04-13 (×3): 0.2 mg via INTRAVENOUS
  Filled 2016-04-12 (×3): qty 1

## 2016-04-12 MED ORDER — PIPERACILLIN-TAZOBACTAM 3.375 G IVPB: 3.3750 g | Freq: Three times a day (TID) | INTRAVENOUS | Status: DC

## 2016-04-12 MED ORDER — LORAZEPAM 1 MG PO TABS: 1.0000 mg | ORAL_TABLET | ORAL | Status: DC | PRN

## 2016-04-12 MED ORDER — PIPERACILLIN-TAZOBACTAM 3.375 G IVPB 30 MIN
3.3750 g | INTRAVENOUS | Status: AC
  Administered 2016-04-12: 3.375 g via INTRAVENOUS
  Filled 2016-04-12: qty 50

## 2016-04-12 MED ORDER — MORPHINE BOLUS VIA INFUSION
5.0000 mg | INTRAVENOUS | Status: DC | PRN
  Filled 2016-04-12: qty 20

## 2016-04-12 MED ORDER — ENOXAPARIN SODIUM 30 MG/0.3ML ~~LOC~~ SOLN: 30.0000 mg | Freq: Every day | SUBCUTANEOUS | Status: DC

## 2016-04-12 MED ORDER — LORAZEPAM 2 MG/ML IJ SOLN
1.0000 mg | INTRAMUSCULAR | Status: DC | PRN
Start: 2016-04-12 — End: 2016-04-19
  Administered 2016-04-12 – 2016-04-18 (×6): 1 mg via INTRAVENOUS
  Filled 2016-04-12 (×6): qty 1

## 2016-04-12 MED ORDER — MORPHINE BOLUS VIA INFUSION
2.0000 mg | INTRAVENOUS | Status: DC | PRN
  Administered 2016-04-12 (×4): 2 mg via INTRAVENOUS
  Administered 2016-04-18: 1 mg via INTRAVENOUS
  Filled 2016-04-12 (×5): qty 2

## 2016-04-12 MED ORDER — VANCOMYCIN HCL 500 MG IV SOLR: 500.0000 mg | INTRAVENOUS | Status: DC

## 2016-04-12 MED ORDER — HALOPERIDOL LACTATE 5 MG/ML IJ SOLN: 1.0000 mg | Freq: Four times a day (QID) | INTRAMUSCULAR | Status: DC | PRN

## 2016-04-12 MED ORDER — LORAZEPAM 2 MG/ML PO CONC: 1.0000 mg | ORAL | Status: DC | PRN

## 2016-04-12 MED ORDER — MORPHINE SULFATE (PF) 2 MG/ML IV SOLN
1.0000 mg | INTRAVENOUS | Status: DC | PRN
  Administered 2016-04-12: 1 mg via INTRAVENOUS
  Filled 2016-04-12: qty 1

## 2016-04-12 NOTE — ED Notes (Signed)
Pts son states that at [facility] she was supposed to have a "swallow test done because she has been keeping everything in her mouth, they want to make sure she is swallowing ok". Pts son and sister are requesting a "swallow evaluation" be completed prior to the pt leaving the hospital.

## 2016-04-12 NOTE — ED Notes (Signed)
Dr. Tamala Julian at bedside - Pt is only responsive to painful stimuli.

## 2016-04-12 NOTE — Progress Notes (Signed)
   04/12/16 0916  Clinical Encounter Type  Visited With Patient and family together  Visit Type Patient actively dying  Referral From Chaplain  Consult/Referral To Chaplain  Spiritual Encounters  Spiritual Needs Prayer  Stress Factors  Family Stress Factors Health changes;Major life changes  Chaplain responded to consult, patient transitioning, family at [employer], provided prayer and spiritual presence, stepped out to allow family reflection. Advised that Chaplain is available as needed 24/7.

## 2016-04-12 NOTE — ED Notes (Signed)
Chaplin at bedside

## 2016-04-12 NOTE — ED Notes (Signed)
Pts brief was saturated with urine when this RN changed it.

## 2016-04-12 NOTE — ED Notes (Signed)
Blood pressure cuff moved to pts arm.

## 2016-04-12 NOTE — ED Notes (Signed)
Dr. Tamala Julian at bedside - pts sister and son are both ok with starting Dopamine to elevate blood pressure

## 2016-04-12 NOTE — Progress Notes (Signed)
Patient ID: Sherri Singh, female   DOB: [birth year], 75 y.o.   MRN: [id number]    PROGRESS NOTE    LEIGHANNE ADOLPH  CWU:[id number] DOB: [birth year] DOA: 04/20/2016  PCP: Lynne Leader, MD   Brief Narrative:  75 y.o. female with medical history significant of hypertension, CVA with dysarthria, COPD, former smoker, SCLC (s/p of XRT and chemotherapy, in remission), essential tremor s/p deep brain stimulator, aortic regurgitation; who presented with abdominal pain and generalized weakness. Pt has underlying dysarthria and unable to provide history, had recent hip fracture and repair by Dr. Berenice Primas, was at [facility] place. Over the last 10 days she has had a progressive decline.   ED Course: In ED, CT of the abdomen showed a mid to distal small bowel obstruction, T12 compression fracture likely from previous fall, and a 2.3 centimeter infrarenal abdominal aortic aneurysm. Urinalysis was positive for yeast. General surgery was consulted by the ED. Patient was ordered to have NG tube to suction and TRH called to admit.  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   Objective: Filed Vitals:   04/12/16 0515 04/12/16  0520 04/12/16 0545 04/12/16 0637  BP: '91/47 96/45 86/44 '$ 84/34  Pulse: 97 95 105 93  Temp:    98.1 F (36.7 C)  TempSrc:    Axillary  Resp: '20 20 22   '$ SpO2: 100% 100% 100% 100%    Intake/Output Summary (Last 24 hours) at 04/12/16 0957 Last data filed at 04/12/16 0827  Gross per 24 hour  Intake   3210 ml  Output   2120 ml  Net   1090 ml   There were no vitals filed for this visit.  Examination:  General exam: Appears calm and comfortable Respiratory system: Respiratory effort normal. Cardiovascular system: S1 & S2 heard, No JVD, murmurs, rubs, gallops or clicks. No pedal edema. Gastrointestinal system: Abdomen is distended, hard and nontender.    Data Reviewed: I have personally reviewed following labs and imaging studies  CBC:  Recent Labs Lab 04/09/16 04/08/2016 1140 03/28/2016 2230  WBC 16.7 13.4*  --   NEUTROABS 14529 11.3*  --   HGB 15.8 14.6 14.6  HCT 49* 45.0 46.3*  MCV  --  95.5  --   PLT 290 206  --    Basic Metabolic Panel:  Recent Labs Lab 04/09/16 04/13/2016 1421  NA 145 146*  K 3.6 3.2*  CL  --  109  CO2  --  29  GLUCOSE  --  98  BUN 64* 37*  CREATININE 1.3* 0.86  CALCIUM  --  10.0   Liver Function Tests:  Recent Labs Lab 04/20/2016 1421  AST 25  ALT 30  ALKPHOS 101  BILITOT 1.2  PROT 5.2*  ALBUMIN 2.9*    Recent Labs Lab 04/08/2016 1421  LIPASE 59*   BNP (last 3 results)  Recent Labs  04/26/15 0940  PROBNP 228.70*   Urine analysis:    Component Value Date/Time   COLORURINE YELLOW 03/29/2016 1521   APPEARANCEUR HAZY* 03/28/2016 1521   LABSPEC 1.026 04/20/2016 1521   PHURINE 5.5 04/21/2016 1521   GLUCOSEU NEGATIVE 04/03/2016 1521   HGBUR NEGATIVE 03/26/2016 1521   BILIRUBINUR NEGATIVE 04/12/2016 1521   KETONESUR NEGATIVE 03/23/2016 1521   PROTEINUR NEGATIVE 04/12/2016 1521   UROBILINOGEN 0.2 05/02/2012 0055   NITRITE NEGATIVE 04/13/2016 1521   LEUKOCYTESUR TRACE* 03/27/2016 1521   Recent Results (from the past 240  hour(s))  MRSA PCR Screening     Status: None   Collection Time: 04/12/16  7:40 AM  Result Value Ref Range Status   MRSA by PCR NEGATIVE NEGATIVE Final    Comment:        The GeneXpert MRSA Assay (FDA approved for NASAL specimens only), is one component of a comprehensive MRSA colonization surveillance program. It is not intended to diagnose MRSA infection nor to guide or monitor treatment for MRSA infections.       Radiology Studies: Ct Abdomen Pelvis Wo Contrast  04/20/2016  CLINICAL DATA:  Progressive diffuse abdominal pain with nausea and vomiting for 1 week. Leukocytosis. Personal history of left small cell lung carcinoma. EXAM: CT ABDOMEN AND PELVIS WITHOUT CONTRAST TECHNIQUE: Multidetector CT imaging of the abdomen and pelvis was performed following the standard protocol without IV contrast. COMPARISON:  PET-CT on 05/02/2009 FINDINGS: Lower chest:  No acute findings. Hepatobiliary: No mass visualized on this un-enhanced exam. Small cyst in dome of right hepatic lobe is stable. Gallbladder is unremarkable. Pancreas: No mass or inflammatory process identified on this un-enhanced exam. Spleen: Within normal limits in size. Adrenals/Urinary Tract: Stable small low-attenuation left adrenal mass is consistent with a benign adenoma. Fluid attenuation renal cysts again seen in both kidneys. Large cyst in the right kidney and shows a thin calcified septation, without change in appearance since previous study. No evidence of ureteral calculi or dilatation. A few tiny calculi are seen along the left posterior bladder wall. Stomach/Bowel: Stomach, proximal, and mid small bowel loops are moderately dilated, with transition point to nondilated distal small bowel loops seen in the right posterior pelvis adjacent to right iliac vessels on image 58/series 201. Surgical clips in are seen in the pelvis and this most likely represents an adhesion. No definite bowel wall thickening or mass seen.  Diverticulosis is seen involving the descending sigmoid colon, however there is no evidence of diverticulitis. Vascular/Lymphatic: No pathologically enlarged lymph nodes. Saccular aneurysm of the infrarenal abdominal aorta seen measuring 3.2 cm in maximum diameter. This is increased from 2.8 cm in 2010. Reproductive: No mass or other significant abnormality. Other: None. Musculoskeletal: No suspicious bone lesions identified. Benign-appearing compression fracture of the T12 vertebral body is seen, which may be acute or subacute in age, and is new compared to prior lumbar spine radiographs on 11/13/2014. Internal fixation hardware seen within the right hip. IMPRESSION: Mid to distal small bowel obstruction with transition point in the right posterior pelvis adjacent to right iliac vessels, likely due to adhesion. Colonic diverticulosis. No radiographic evidence of diverticulitis. Tiny calculi within urinary bladder. No evidence of urolithiasis or hydronephrosis. Benign appearing T12 vertebral body compression fracture, new compared to previous lumbar spine radiographs  on 11/13/2014. 3.2 cm infrarenal abdominal aortic aneurysm. Recommend followup by ultrasound in 3 years. This recommendation follows ACR consensus guidelines: White Paper of the ACR Incidental Findings Committee II on Vascular Findings. [facility] Radiol 2013; 10:789-794 Electronically Signed   By: Earle Gell M.D.   On: 03/31/2016 18:21   Dg Chest Portable 1 View  04/12/2016  CLINICAL DATA:  Tachypnea. EXAM: PORTABLE CHEST 1 VIEW COMPARISON:  March 21, 2016 FINDINGS: Scarring is again seen in the left upper lung, abutting the hilum. This is stable. The heart, hila, and mediastinum are unchanged. No pulmonary nodules or masses. No infiltrates identified. IMPRESSION: No active disease. Electronically Signed   By: Dorise Bullion III M.D   On: 04/16/2016 22:32   Dg Abd Portable 1v  04/04/2016  CLINICAL DATA:  Evaluate NG tube EXAM: PORTABLE ABDOMEN -  1 VIEW COMPARISON:  None. FINDINGS: The NG tube terminates with the side port and distal tip in the stomach. IMPRESSION: The NG tube is in good position. Electronically Signed   By: Dorise Bullion III M.D   On: 03/25/2016 22:32      Scheduled Meds: . scopolamine  1 patch Transdermal Q72H   Continuous Infusions: . morphine 1 mg/hr (04/12/16 0655)     LOS: 1 day   Time spent: 20 minutes   Faye Ramsay, MD [facility] Pager [phone]  If 7PM-7AM, please contact night-coverage [email or web address] Password [facility] 04/12/2016, 9:57 AM

## 2016-04-12 NOTE — Consult Note (Signed)
PULMONARY / CRITICAL CARE MEDICINE   Name: Sherri Singh MRN: [id number] DOB: 13-Oct-1940    ADMISSION DATE:  04/08/2016 CONSULTATION DATE:  7/21  REFERRING MD:  Dr Tamala Julian  CHIEF COMPLAINT:  shock  HISTORY OF PRESENT ILLNESS:  20 multiple med issues, lung cancer small cell seen by Dr Chase Caller , recent hip FX fall June 28th, to NH and failure to thriove wt loss, recent E coli UTi also. Presented 1 week weakness, not eating, n/ v, no d, limitd output from below. To ER, hypernatremia, CT with bwoel obstruction, dop started for hypotension. Lactic wnl. called to assess with shock and dop needs. Received 1.7 liters prior to arrival. NGt was placed to suction. ABX started.   PAST MEDICAL HISTORY :  She  has a past medical history of Lung cancer ([place]); Benign essential tremor; Lung cancer ([place]); and COPD (chronic obstructive pulmonary disease) ([place]).  PAST SURGICAL HISTORY: She  has past surgical history that includes Deep brain stimulator placement; Mouth surgery; Breast surgery; Appendectomy; Ovary surgery; Femur IM nail (Right, 03/20/2016); and Hip surgery (Right, july 2017).  Allergies  Allergen Reactions  . Codeine Other (See Comments)  . Other Other (See Comments)    Peanuts, Causes headaches Allergic to a type of pain medication, unable to recall name of medication at this time  . Budesonide-Formoterol Fumarate     REACTION: hoarseness  . Iohexol      Code: HIVES, Desc: pt developed hives 30+ yrs ago; needs 13 hour prep in future per Dr Leonia Reeves; 06/26/09 slg   . Morphine Other (See Comments)    Unsure of Reaction  . Peanut-Containing Drug Products Other (See Comments)    unknown  . Shellfish-Derived Products Other (See Comments)    Positive allergy test- has never had a reaction  . Iodine Rash    Boils on skin    No current facility-administered medications on file prior to encounter.   Current Outpatient Prescriptions on File Prior to Encounter  Medication Sig  . aspirin  EC 325 MG tablet Take 1 tablet (325 mg total) by mouth daily. Take x 1 month post op to decrease risk of blood clots.  . budesonide-formoterol (SYMBICORT) 160-4.5 MCG/ACT inhaler Inhale 2 puffs into the lungs 2 (two) times daily.  . Cholecalciferol (VITAMIN D PO) Take 1 tablet by mouth daily. D3  . docusate sodium (COLACE) 100 MG capsule Take 1 capsule (100 mg total) by mouth 2 (two) times daily.  Marland Kitchen HYDROcodone-acetaminophen (NORCO) 5-325 MG tablet Take 1-2 tablets by mouth every 6 (six) hours as needed for severe pain.  Marland Kitchen ipratropium-albuterol (DUONEB) 0.5-2.5 (3) MG/3ML SOLN Take 3 mLs by nebulization every 8 (eight) hours as needed (Dyspnea or wheezing).  . methocarbamol (ROBAXIN) 500 MG tablet Take 500 mg by mouth every 8 (eight) hours as needed for muscle spasms.  . metoprolol (LOPRESSOR) 100 MG tablet Take 1 tablet (100 mg total) by mouth 2 (two) times daily.  . ondansetron (ZOFRAN) 4 MG tablet Take 4 mg by mouth every 6 (six) hours as needed for nausea.  . ondansetron (ZOFRAN) 4 MG tablet Take 4 mg by mouth 2 (two) times daily. Give before breakfast and lunch  . polyethylene glycol (MIRALAX / GLYCOLAX) packet Take 17 g by mouth daily as needed for mild constipation.  . ferrous sulfate 325 (65 FE) MG tablet Take 1 tablet (325 mg total) by mouth 2 (two) times daily with a meal. (Patient not taking: Reported on 03/27/2016)    FAMILY HISTORY:  Her has no family status information on file.   SOCIAL HISTORY: She  reports that she quit smoking about 18 years ago. She does not have any smokeless tobacco history on file. She reports that she does not drink alcohol or use illicit drugs.  REVIEW OF SYSTEMS:   Unable, unresponsive  SUBJECTIVE:  Worsening agonal bretahing  VITAL SIGNS: BP 96/45 mmHg  Pulse 95  Temp(Src) 98.8 F (37.1 C) (Rectal)  Resp 20  SpO2 100%  HEMODYNAMICS:    VENTILATOR SETTINGS:    INTAKE / OUTPUT:    PHYSICAL EXAMINATION: General:  Agonal,  cachetic Neuro:  Agonal, perr, groaning once, no movementlower HEENT:  jvd down Cardiovascular:  s1 s2 rrr Lungs:  coarse Abdomen:  Soft, distended, low BS, no r/g Musculoskeletal:  Poor muslce mass Skin:  No rash  LABS:  BMET  Recent Labs Lab 04/09/16 03/26/2016 1421  NA 145 146*  K 3.6 3.2*  CL  --  109  CO2  --  29  BUN 64* 37*  CREATININE 1.3* 0.86  GLUCOSE  --  98    Electrolytes  Recent Labs Lab 04/09/2016 1421  CALCIUM 10.0    CBC  Recent Labs Lab 04/09/16 04/12/2016 1140 04/22/2016 2230  WBC 16.7 13.4*  --   HGB 15.8 14.6 14.6  HCT 49* 45.0 46.3*  PLT 290 206  --     Coag's No results for input(s): APTT, INR in the last 168 hours.  Sepsis Markers  Recent Labs Lab 04/05/2016 1440  LATICACIDVEN 1.58    ABG No results for input(s): PHART, PCO2ART, PO2ART in the last 168 hours.  Liver Enzymes  Recent Labs Lab 03/23/2016 1421  AST 25  ALT 30  ALKPHOS 101  BILITOT 1.2  ALBUMIN 2.9*    Cardiac Enzymes No results for input(s): TROPONINI, PROBNP in the last 168 hours.  Glucose No results for input(s): GLUCAP in the last 168 hours.  Imaging Ct Abdomen Pelvis Wo Contrast  04/01/2016  CLINICAL DATA:  Progressive diffuse abdominal pain with nausea and vomiting for 1 week. Leukocytosis. Personal history of left small cell lung carcinoma. EXAM: CT ABDOMEN AND PELVIS WITHOUT CONTRAST TECHNIQUE: Multidetector CT imaging of the abdomen and pelvis was performed following the standard protocol without IV contrast. COMPARISON:  PET-CT on 05/02/2009 FINDINGS: Lower chest:  No acute findings. Hepatobiliary: No mass visualized on this un-enhanced exam. Small cyst in dome of right hepatic lobe is stable. Gallbladder is unremarkable. Pancreas: No mass or inflammatory process identified on this un-enhanced exam. Spleen: Within normal limits in size. Adrenals/Urinary Tract: Stable small low-attenuation left adrenal mass is consistent with a benign adenoma. Fluid  attenuation renal cysts again seen in both kidneys. Large cyst in the right kidney and shows a thin calcified septation, without change in appearance since previous study. No evidence of ureteral calculi or dilatation. A few tiny calculi are seen along the left posterior bladder wall. Stomach/Bowel: Stomach, proximal, and mid small bowel loops are moderately dilated, with transition point to nondilated distal small bowel loops seen in the right posterior pelvis adjacent to right iliac vessels on image 58/series 201. Surgical clips in are seen in the pelvis and this most likely represents an adhesion. No definite bowel wall thickening or mass seen. Diverticulosis is seen involving the descending sigmoid colon, however there is no evidence of diverticulitis. Vascular/Lymphatic: No pathologically enlarged lymph nodes. Saccular aneurysm of the infrarenal abdominal aorta seen measuring 3.2 cm in maximum diameter. This is increased from 2.8 cm  in 2010. Reproductive: No mass or other significant abnormality. Other: None. Musculoskeletal: No suspicious bone lesions identified. Benign-appearing compression fracture of the T12 vertebral body is seen, which may be acute or subacute in age, and is new compared to prior lumbar spine radiographs on 11/13/2014. Internal fixation hardware seen within the right hip. IMPRESSION: Mid to distal small bowel obstruction with transition point in the right posterior pelvis adjacent to right iliac vessels, likely due to adhesion. Colonic diverticulosis. No radiographic evidence of diverticulitis. Tiny calculi within urinary bladder. No evidence of urolithiasis or hydronephrosis. Benign appearing T12 vertebral body compression fracture, new compared to previous lumbar spine radiographs on 11/13/2014. 3.2 cm infrarenal abdominal aortic aneurysm. Recommend followup by ultrasound in 3 years. This recommendation follows ACR consensus guidelines: White Paper of the ACR Incidental Findings  Committee II on Vascular Findings. [facility] Radiol 2013; 10:789-794 Electronically Signed   By: Earle Gell M.D.   On: 04/09/2016 18:21   Dg Chest Portable 1 View  04/10/2016  CLINICAL DATA:  Tachypnea. EXAM: PORTABLE CHEST 1 VIEW COMPARISON:  March 21, 2016 FINDINGS: Scarring is again seen in the left upper lung, abutting the hilum. This is stable. The heart, hila, and mediastinum are unchanged. No pulmonary nodules or masses. No infiltrates identified. IMPRESSION: No active disease. Electronically Signed   By: Dorise Bullion III M.D   On: 03/28/2016 22:32   Dg Abd Portable 1v  03/25/2016  CLINICAL DATA:  Evaluate NG tube EXAM: PORTABLE ABDOMEN - 1 VIEW COMPARISON:  None. FINDINGS: The NG tube terminates with the side port and distal tip in the stomach. IMPRESSION: The NG tube is in good position. Electronically Signed   By: Dorise Bullion III M.D   On: 04/13/2016 22:32      ASSESSMENT / PLAN:  Bowel Obstruction Hypovolemic shock Failure to thrive Small cell lung cancer THIS IS NOT SEPSIS  Extensive d/w family. She has been declining and failing to throve with fall and fx in setting of multipe med problems and small cell lung ca We discussed her prior wishes, she would NOT have wanted any forms life support plac was to dc dop after 1 liter. We discused options for pure comfort as her breathing also is worsening.  Extensive discussion with family sona dn daughter. We discussed the poor prognosis and likely poor quality of life. Family has decided to offer full comfort care. They are aware that the patient may be transferred to palliative care floor for continued comfort care needs. They have been fully updated on the process and expectations. Updated Dr Tamala Julian and ER doc and RN to dc dop once morphine is started for rr increasing and agonal Keep NGt  For comfort to avoid aspiration and to decompress for comfort She requires morphien and drip Will sign off Would NOT escalate O2 needs will  prolong suffering   Lavon Paganini. Titus Mould, MD, FACP Pgr: [facility] Pager: [phone]  04/12/2016, 5:33 AM

## 2016-04-12 NOTE — Progress Notes (Signed)
Pharmacy Antibiotic Note  Sherri Singh is a 75 y.o. female admitted on 03/30/2016 with abdominal pain/SBO, hypotension and leukocytosis and possible sepsis .  Pharmacy has been consulted for Vancomycin and Zosyn dosing.  Plan: Vancomycin 1 g IV now, then 500 mg IV q24h Zosyn 3.375 g IV q8h    Temp (24hrs), Avg:98.4 F (36.9 C), Min:97.3 F (36.3 C), Max:99.2 F (37.3 C)   Recent Labs Lab 04/09/16 04/03/2016 1140 04/07/2016 1421 04/10/2016 1440  WBC 16.7 13.4*  --   --   CREATININE 1.3*  --  0.86  --   LATICACIDVEN  --   --   --  1.58    Estimated Creatinine Clearance: 43.6 mL/min (by C-G formula based on Cr of 0.86).    Allergies  Allergen Reactions  . Codeine Other (See Comments)  . Other Other (See Comments)    Peanuts, Causes headaches Allergic to a type of pain medication, unable to recall name of medication at this time  . Budesonide-Formoterol Fumarate     REACTION: hoarseness  . Iohexol      Code: HIVES, Desc: pt developed hives 30+ yrs ago; needs 13 hour prep in future per Dr Leonia Reeves; 06/26/09 slg   . Morphine Other (See Comments)    Unsure of Reaction  . Peanut-Containing Drug Products Other (See Comments)    unknown  . Shellfish-Derived Products Other (See Comments)    Positive allergy test- has never had a reaction  . Iodine Rash    Boils on skin    Sherri Singh 04/12/2016 3:36 AM

## 2016-04-12 NOTE — ED Notes (Signed)
Dr. Smith at bedside.

## 2016-04-12 NOTE — Progress Notes (Signed)
Pt transfer from ED. Pt is unresponsive and here for full comfort measure. Family at bedside. Will cont. pt

## 2016-04-12 NOTE — ED Notes (Signed)
Dr. Tamala Julian at bedside - aware of blood pressure and nasal cannula.

## 2016-04-12 NOTE — ED Notes (Signed)
Pt is sitting up in bed, she is able to properly name and identify her three family members that are present.

## 2016-04-12 NOTE — ED Notes (Signed)
Message sent to pharmacy requesting dopamine be verified and sent to Pod B.

## 2016-04-12 NOTE — ED Notes (Signed)
Suction to NG tube turned off.

## 2016-04-12 NOTE — ED Notes (Signed)
Charge RN David at bedside attempting IV with ultrasound.

## 2016-04-12 NOTE — ED Notes (Signed)
Dr. Tamala Julian aware of pts blood pressure and NG output. He would like the pt hooked back up to low intermittent suction, if the pt has more than 500 ml of out put before 0330 then the suction needs to be turned off again.

## 2016-04-12 NOTE — Consult Note (Signed)
Consultation Note Date: 04/12/2016   Patient Name: Sherri Singh  DOB: [birth year]  MRN: [id number]  Age / Sex: 75 y.o., female  PCP: Gregor Hams, MD Referring Physician: Theodis Blaze, MD  Reason for Consultation: Terminal Care  HPI/Patient Profile: 75 y.o. female  with past medical history of small cell lung cancer, recent hip fracture, failure to thrive, Escherichia coli UTI admitted on 04/05/2016 with hypernatremia, hypotension, and bowel obstruction admitted on comfort care.   Clinical Assessment and Goals of Care: Met with patient and her 2 sons. Patient is largely nonresponsive during encounter  Her sons report having a good understanding of her clinical condition. There able to discuss with multiple doctors in the emergency room including critical care medicine. The most important thing to them is ensuring that her mother's comfort moving forward.   SUMMARY OF RECOMMENDATIONS   - DO NOT RESUSCITATE /DO NOT INTUBATE - Full comfort care - Reviewed orders and updated using end-of-life order set - She is actively dying. I anticipate her prognosis to be a matter of hours to days at best. - Significant time spent with family answering questions and providing anticipatory guidance on what to expect as she approaches end-of-life.   Code Status/Advance Care Planning:  DNR    Symptom Management:   Shortness of breath/pain: Continue morphine as needed. She is currently on continuous infusion. We'll also plan for continue bolus dosing as needed to effectively control her symptoms.  Nausea: She currently has G-tube in place. I agree with working to remove this. We'll ensure that she is not having any residual volumes with trial of suction followed by removal NG tube if not significant output.  Palliative Prophylaxis:   Frequent Pain Assessment  Additional Recommendations (Limitations, Scope,  Preferences):  Full Comfort Care  Psycho-social/Spiritual:   Desire for further Chaplaincy support:no  Additional Recommendations: Education on Hospice  Prognosis:   Hours - Days  Discharge Planning: Anticipated Hospital Death. If her condition stabilizes enough to consider transition, she would best be served a residential hospice placement       Primary Diagnoses: Present on Admission:  . SBO (small bowel obstruction) ([place]) . HTN (hypertension) . Hypernatremia . Essential hypertension . DNR (do not resuscitate) . Abdominal aortic aneurysm (AAA) ([place])  I have reviewed the medical record, interviewed the patient and family, and examined the patient. The following aspects are pertinent.  Past Medical History  Diagnosis Date  . Lung cancer ([place])     lung ca dx 04/2009  . Benign essential tremor   . Lung cancer ([place])   . COPD (chronic obstructive pulmonary disease) ([place])    Social History   Social History  . Marital Status: Single    Spouse Name: N/A  . Number of Children: N/A  . Years of Education: N/A   Social History Main Topics  . Smoking status: Former Smoker -- 33 years    Quit date: 09/23/1997  . Smokeless tobacco: None  . Alcohol Use: No  . Drug Use: No  .  Sexual Activity: No   Other Topics Concern  . None   Social History Narrative   Family History  Problem Relation Age of Onset  . Cancer Mother     lung  . Heart attack Mother   . Diabetes Mother    Scheduled Meds: . scopolamine  1 patch Transdermal Q72H   Continuous Infusions: . morphine 1 mg/hr (04/12/16 0949)   PRN Meds:.glycopyrrolate, haloperidol lactate, LORazepam **OR** [DISCONTINUED] LORazepam **OR** LORazepam, morphine, ondansetron **OR** ondansetron (ZOFRAN) IV Medications Prior to Admission:  Prior to Admission medications   Medication Sig Start Date End Date Taking? Authorizing Provider  aspirin EC 325 MG tablet Take 1 tablet (325 mg total) by mouth daily. Take x 1 month  post op to decrease risk of blood clots. 03/20/16  Yes Gary Fleet, PA-C  budesonide-formoterol (SYMBICORT) 160-4.5 MCG/ACT inhaler Inhale 2 puffs into the lungs 2 (two) times daily. 11/07/15  Yes Gregor Hams, MD  Cholecalciferol (VITAMIN D PO) Take 1 tablet by mouth daily. D3   Yes Historical Provider, MD  docusate sodium (COLACE) 100 MG capsule Take 1 capsule (100 mg total) by mouth 2 (two) times daily. 03/23/16  Yes Bonnell Public, MD  HYDROcodone-acetaminophen (NORCO) 5-325 MG tablet Take 1-2 tablets by mouth every 6 (six) hours as needed for severe pain. 03/25/16  Yes Tiffany L Reed, DO  ipratropium-albuterol (DUONEB) 0.5-2.5 (3) MG/3ML SOLN Take 3 mLs by nebulization every 8 (eight) hours as needed (Dyspnea or wheezing).   Yes Historical Provider, MD  methocarbamol (ROBAXIN) 500 MG tablet Take 500 mg by mouth every 8 (eight) hours as needed for muscle spasms.   Yes Historical Provider, MD  metoprolol (LOPRESSOR) 100 MG tablet Take 1 tablet (100 mg total) by mouth 2 (two) times daily. 03/23/16  Yes Bonnell Public, MD  ondansetron (ZOFRAN) 4 MG tablet Take 4 mg by mouth every 6 (six) hours as needed for nausea.   Yes Historical Provider, MD  ondansetron (ZOFRAN) 4 MG tablet Take 4 mg by mouth 2 (two) times daily. Give before breakfast and lunch   Yes Historical Provider, MD  polyethylene glycol (MIRALAX / GLYCOLAX) packet Take 17 g by mouth daily as needed for mild constipation. 03/23/16  Yes Bonnell Public, MD  ferrous sulfate 325 (65 FE) MG tablet Take 1 tablet (325 mg total) by mouth 2 (two) times daily with a meal. Patient not taking: Reported on 04/02/2016 03/23/16   Bonnell Public, MD   Allergies  Allergen Reactions  . Codeine Other (See Comments)  . Other Other (See Comments)    Peanuts, Causes headaches Allergic to a type of pain medication, unable to recall name of medication at this time  . Budesonide-Formoterol Fumarate     REACTION: hoarseness  . Iohexol      Code: HIVES,  Desc: pt developed hives 30+ yrs ago; needs 13 hour prep in future per Dr Leonia Reeves; 06/26/09 slg   . Morphine Other (See Comments)    Unsure of Reaction  . Peanut-Containing Drug Products Other (See Comments)    unknown  . Shellfish-Derived Products Other (See Comments)    Positive allergy test- has never had a reaction  . Iodine Rash    Boils on skin   Review of Systems  Physical Exam  General: Elderly female lying in bed. Largely nonresponsive during examination  HEENT: No bruits, no goiter, no JVD Heart: Regular rate and rhythm. No murmur appreciated. Lungs: Good air movement, clear Abdomen: Soft, nontender, distended, positive  bowel sounds.  Ext: Trace edema Skin: Warm and dry  Vital Signs: BP 101/36 mmHg  Pulse 93  Temp(Src) 98.1 F (36.7 C) (Axillary)  Resp 20  SpO2 100% Pain Assessment: PAINAD   Pain Score: Asleep   SpO2: SpO2: 100 % O2 Device:SpO2: 100 % O2 Flow Rate: .O2 Flow Rate (L/min): 2 L/min  IO: Intake/output summary:  Intake/Output Summary (Last 24 hours) at 04/12/16 1838 Last data filed at 04/12/16 1700  Gross per 24 hour  Intake   3220 ml  Output   2435 ml  Net    785 ml    LBM:   Baseline Weight:   Most recent weight:       Palliative Assessment/Data:   Flowsheet Rows        Most Recent Value   Intake Tab    Referral Department  Hospitalist   Unit at Time of Referral  Med/Surg Unit   Palliative Care Primary Diagnosis  Cancer   Date Notified  04/02/2016   Palliative Care Type  New Palliative care   Reason for referral  End of Life Care Assistance   Date of Admission  03/30/2016   Date first seen by Palliative Care  04/12/16   # of days Palliative referral response time  1 Day(s)   # of days IP prior to Palliative referral  0   Clinical Assessment    Palliative Performance Scale Score  10%   Pain Max last 24 hours  Not able to report   Pain Min Last 24 hours  Not able to report   Psychosocial & Spiritual Assessment    Palliative Care  Outcomes    Patient/Family meeting held?  Yes   Who was at the meeting?  Patient's sons   Palliative Care Outcomes  Improved pain interventions, Improved non-pain symptom therapy      Time In: 0910 Time Out: 0950 Time Total: 40 Greater than 50%  of this time was spent counseling and coordinating care related to the above assessment and plan.  Signed by: Micheline Rough, MD   Please contact Palliative Medicine Team phone at [phone] for questions and concerns.  For individual provider: See Shea Evans

## 2016-04-12 NOTE — ED Notes (Signed)
Chaplin paged for pts family members.

## 2016-04-12 NOTE — ED Notes (Signed)
DrtTamala Julian at bedside

## 2016-04-12 NOTE — ED Notes (Signed)
Blood pressure cuff moved to the pts leg

## 2016-04-12 NOTE — ED Notes (Signed)
This RN gave Rande Brunt RN the unopened bag of morphine for the pt that was sent up from pharmacy.  Morphine 250 mg in dextrose 5% 250 ml ('1mg'$ /ml).

## 2016-04-12 NOTE — ED Notes (Signed)
Eddie Dibbles NP and Dr. Titus Mould at bedside

## 2016-04-12 NOTE — ED Notes (Signed)
IV team at bedside 

## 2016-04-12 NOTE — ED Notes (Addendum)
Dr. Tamala Julian at bedside - Dr. Tamala Julian states that the pt is still step down appropriate.

## 2016-04-12 NOTE — Progress Notes (Signed)
I was asked to see this patient with a bowel obstruction that was being admitted to the medical service who was hemodynamically stable without abdominal pain.  Lactic acid level was 1.53.  She had recently been discharged from the hospital 2.5 weeks ago after hospitalization for a right hip fracture and UTI.  She has been on a slow decline since that time.  I reviewed her CT scan (not abdominal films) which showed a high grade bowel obstruction without evidence of ischemia.  CT was done without contrast.  As I came by to see the patient this AM at about 0545 I was advised that she had elected to have comfort care only.  Apparently she declined physiologically after I initially spoke with the ED PA.  This was confirmed by the admitting medical doctor at approximately 2:30 AM, but at that time she was not comfort care.  I advised him that the patient would not have been a candidate for surgery at that time unless there was evidence of peritonitis, which there clearly was not according to initial and subsequent reports.  The initial treatment of her bowel obstruction is NGT decompression and IV hydration if there is no peritonitis.  Surgical management is not initially indicated since the majority olf these will resolve without surgery.  We will signoff now in face of her current status.  Kathryne Eriksson. Dahlia Bailiff, MD, [place] [phone] [facility]

## 2016-04-12 NOTE — Progress Notes (Signed)
Spoke with palliative MD this morning- wanted to flush NG tube to see if it would start to pull gastric fluid. Family asked that if the NG did not pull much- if it could be d/c'ed. NG has not pulled much besides the flush that was put in.

## 2016-04-12 NOTE — ED Provider Notes (Signed)
Pt in ED holding for bed.  Admitted by Dr. Tamala Julian.  Pt needed peripheral IV access. I placed a 20-gauge peripheral IV in her left AC.  I was otherwise not involved in her care. Patient being managed by hospitalist.   Angiocath insertion Performed by: Nyra Jabs  Consent: Verbal consent obtained. Risks and benefits: risks, benefits and alternatives were discussed Time out: Immediately prior to procedure a "time out" was called to verify the correct patient, procedure, equipment, support staff and site/side marked as required.  Preparation: Patient was prepped and draped in the usual sterile fashion.  Vein Location: left AC  Ultrasound Guided  Gauge: 20  Normal blood return and flush without difficulty Patient tolerance: Patient tolerated the procedure well with no immediate complications.     [place], DO 04/12/16 [phone]

## 2016-04-12 NOTE — ED Notes (Addendum)
Dr. Tamala Julian at bedside - aware of the pts blood pressure

## 2016-04-13 DIAGNOSIS — R1031 Right lower quadrant pain: Secondary | ICD-10-CM

## 2016-04-13 LAB — URINE CULTURE: Culture: >=100000 — AB

## 2016-04-13 MED ORDER — CHLORHEXIDINE GLUCONATE 0.12 % MT SOLN
15.0000 mL | Freq: Two times a day (BID) | OROMUCOSAL | Status: DC
  Administered 2016-04-13 – 2016-04-18 (×10): 15 mL via OROMUCOSAL
  Filled 2016-04-13 (×6): qty 15

## 2016-04-13 MED ORDER — CETYLPYRIDINIUM CHLORIDE 0.05 % MT LIQD
7.0000 mL | Freq: Two times a day (BID) | OROMUCOSAL | Status: DC
  Administered 2016-04-13 – 2016-04-18 (×10): 7 mL via OROMUCOSAL

## 2016-04-13 NOTE — Progress Notes (Signed)
Family requesting to talk to doctor, Dr. Domingo Cocking called and he will be up soon to speak with the family.

## 2016-04-13 NOTE — Progress Notes (Signed)
Patient ID: Sherri Singh, female   DOB: [birth year], 75 y.o.   MRN: [id number]    PROGRESS NOTE    NATASHA BURDA  OIN:[id number] DOB: [birth year] DOA: 03/24/2016  PCP: Lynne Leader, MD   Brief Narrative:  75 y.o. female with medical history significant of hypertension, CVA with dysarthria, COPD, former smoker, SCLC (s/p of XRT and chemotherapy, in remission), essential tremor s/p deep brain stimulator, aortic regurgitation; who presented with abdominal pain and generalized weakness. Pt has underlying dysarthria and unable to provide history, had recent hip fracture and repair by Dr. Berenice Primas, was at [facility] place. Over the last 10 days she has had a progressive decline.   ED Course: In ED, CT of the abdomen showed a mid to distal small bowel obstruction, T12 compression fracture likely from previous fall, and a 2.3 centimeter infrarenal abdominal aortic aneurysm. Urinalysis was positive for yeast. General surgery was consulted by the ED. Patient was ordered to have NG tube to suction and TRH called to admit.  Assessment & Plan:  SBO and subsequent Hypovolemic shock - PCCM and surgery team consulted - full comfort desired per pt and family in agreement  - pt comfortable - allow morphine and titrate to comfort   Hyponatremia - pre renal - no further tests to ensure comfort   Hypokalemia - from GI losses - no further blood tests as indicated above  Failure to thrive - comfort care   Small cell lung cancer - no further interventions   DVT prophylaxis: SCD's Code Status: DNR Family Communication: Family at bedside  Disposition Plan: to be determined   Consultants:   PCCM  Surgery   Procedures:   None  Antimicrobials:   None   Subjective: Appears comfortable, family at bedside, asking to remove NGT to allow full comfort.   Objective: Filed Vitals:   04/12/16 0520 04/12/16 0545 04/12/16 0637 04/12/16 1358  BP: '96/45 86/44 84/34 '$ 101/36  Pulse: 95 105 93 93    Temp:   98.1 F (36.7 C) 98.1 F (36.7 C)  TempSrc:   Axillary Axillary  Resp: '20 22  20  '$ SpO2: 100% 100% 100% 100%    Intake/Output Summary (Last 24 hours) at 04/13/16 0945 Last data filed at 04/12/16 1700  Gross per 24 hour  Intake     10 ml  Output    315 ml  Net   -305 ml   There were no vitals filed for this visit.  Examination:  General exam: Appears calm and comfortable Respiratory system: Respiratory effort normal. Cardiovascular system: S1 & S2 heard, No JVD, murmurs, rubs, gallops or clicks. No pedal edema. Gastrointestinal system: Abdomen is distended, hard and nontender.    Data Reviewed: I have personally reviewed following labs and imaging studies  CBC:  Recent Labs Lab 04/09/16 04/08/2016 1140 04/11/2016 2230  WBC 16.7 13.4*  --   NEUTROABS 14529 11.3*  --   HGB 15.8 14.6 14.6  HCT 49* 45.0 46.3*  MCV  --  95.5  --   PLT 290 206  --    Basic Metabolic Panel:  Recent Labs Lab 04/09/16 03/28/2016 1421  NA 145 146*  K 3.6 3.2*  CL  --  109  CO2  --  29  GLUCOSE  --  98  BUN 64* 37*  CREATININE 1.3* 0.86  CALCIUM  --  10.0   Liver Function Tests:  Recent Labs Lab 04/20/2016 1421  AST 25  ALT 30  ALKPHOS 101  BILITOT 1.2  PROT 5.2*  ALBUMIN 2.9*    Recent Labs Lab 03/29/2016 1421  LIPASE 59*   BNP (last 3 results)  Recent Labs  04/26/15 0940  PROBNP 228.70*   Urine analysis:    Component Value Date/Time   COLORURINE YELLOW 03/26/2016 1521   APPEARANCEUR HAZY* 04/06/2016 1521   LABSPEC 1.026 04/02/2016 1521   PHURINE 5.5 03/30/2016 1521   GLUCOSEU NEGATIVE 04/17/2016 1521   HGBUR NEGATIVE 03/29/2016 1521   BILIRUBINUR NEGATIVE 03/31/2016 1521   KETONESUR NEGATIVE 04/10/2016 1521   PROTEINUR NEGATIVE 04/10/2016 1521   UROBILINOGEN 0.2 05/02/2012 0055   NITRITE NEGATIVE 04/15/2016 1521   LEUKOCYTESUR TRACE* 04/03/2016 1521   Recent Results (from the past 240 hour(s))  Culture, blood (routine x 2)     Status: None  (Preliminary result)   Collection Time: 04/01/2016  8:45 PM  Result Value Ref Range Status   Specimen Description BLOOD LEFT HAND  Final   Special Requests IN PEDIATRIC BOTTLE 2CC  Final   Culture NO GROWTH < 24 HOURS  Final   Report Status PENDING  Incomplete  Culture, blood (routine x 2)     Status: None (Preliminary result)   Collection Time: 04/06/2016  9:03 PM  Result Value Ref Range Status   Specimen Description BLOOD RIGHT HAND  Final   Special Requests BOTTLES DRAWN AEROBIC AND ANAEROBIC 5CC  Final   Culture NO GROWTH < 24 HOURS  Final   Report Status PENDING  Incomplete  MRSA PCR Screening     Status: None   Collection Time: 04/12/16  7:40 AM  Result Value Ref Range Status   MRSA by PCR NEGATIVE NEGATIVE Final    Comment:        The GeneXpert MRSA Assay (FDA approved for NASAL specimens only), is one component of a comprehensive MRSA colonization surveillance program. It is not intended to diagnose MRSA infection nor to guide or monitor treatment for MRSA infections.      Radiology Studies: Ct Abdomen Pelvis Wo Contrast  04/04/2016  CLINICAL DATA:  Progressive diffuse abdominal pain with nausea and vomiting for 1 week. Leukocytosis. Personal history of left small cell lung carcinoma. EXAM: CT ABDOMEN AND PELVIS WITHOUT CONTRAST TECHNIQUE: Multidetector CT imaging of the abdomen and pelvis was performed following the standard protocol without IV contrast. COMPARISON:  PET-CT on 05/02/2009 FINDINGS: Lower chest:  No acute findings. Hepatobiliary: No mass visualized on this un-enhanced exam. Small cyst in dome of right hepatic lobe is stable. Gallbladder is unremarkable. Pancreas: No mass or inflammatory process identified on this un-enhanced exam. Spleen: Within normal limits in size. Adrenals/Urinary Tract: Stable small low-attenuation left adrenal mass is consistent with a benign adenoma. Fluid attenuation renal cysts again seen in both kidneys. Large cyst in the right kidney  and shows a thin calcified septation, without change in appearance since previous study. No evidence of ureteral calculi or dilatation. A few tiny calculi are seen along the left posterior bladder wall. Stomach/Bowel: Stomach, proximal, and mid small bowel loops are moderately dilated, with transition point to nondilated distal small bowel loops seen in the right posterior pelvis adjacent to right iliac vessels on image 58/series 201. Surgical clips in are seen in the pelvis and this most likely represents an adhesion. No definite bowel wall thickening or mass seen. Diverticulosis is seen involving the descending sigmoid colon, however there is no evidence of diverticulitis. Vascular/Lymphatic: No pathologically enlarged lymph nodes. Saccular aneurysm of the infrarenal abdominal aorta seen measuring 3.2 cm in maximum  diameter. This is increased from 2.8 cm in 2010. Reproductive: No mass or other significant abnormality. Other: None. Musculoskeletal: No suspicious bone lesions identified. Benign-appearing compression fracture of the T12 vertebral body is seen, which may be acute or subacute in age, and is new compared to prior lumbar spine radiographs on 11/13/2014. Internal fixation hardware seen within the right hip. IMPRESSION: Mid to distal small bowel obstruction with transition point in the right posterior pelvis adjacent to right iliac vessels, likely due to adhesion. Colonic diverticulosis. No radiographic evidence of diverticulitis. Tiny calculi within urinary bladder. No evidence of urolithiasis or hydronephrosis. Benign appearing T12 vertebral body compression fracture, new compared to previous lumbar spine radiographs on 11/13/2014. 3.2 cm infrarenal abdominal aortic aneurysm. Recommend followup by ultrasound in 3 years. This recommendation follows ACR consensus guidelines: White Paper of the ACR Incidental Findings Committee II on Vascular Findings. Natasha Mead Coll Radiol 2013; 10:789-794 Electronically Signed    By: Earle Gell M.D.   On: 04/02/2016 18:21   Dg Chest Portable 1 View  04/12/2016  CLINICAL DATA:  Tachypnea. EXAM: PORTABLE CHEST 1 VIEW COMPARISON:  March 21, 2016 FINDINGS: Scarring is again seen in the left upper lung, abutting the hilum. This is stable. The heart, hila, and mediastinum are unchanged. No pulmonary nodules or masses. No infiltrates identified. IMPRESSION: No active disease. Electronically Signed   By: Dorise Bullion III M.D   On: 04/22/2016 22:32   Dg Abd Portable 1v  03/31/2016  CLINICAL DATA:  Evaluate NG tube EXAM: PORTABLE ABDOMEN - 1 VIEW COMPARISON:  None. FINDINGS: The NG tube terminates with the side port and distal tip in the stomach. IMPRESSION: The NG tube is in good position. Electronically Signed   By: Dorise Bullion III M.D   On: 04/10/2016 22:32   Scheduled Meds: . antiseptic oral rinse  7 mL Mouth Rinse q12n4p  . chlorhexidine  15 mL Mouth Rinse BID  . scopolamine  1 patch Transdermal Q72H   Continuous Infusions: . morphine 1 mg/hr (04/12/16 0949)    LOS: 2 days   Time spent: 20 minutes   Faye Ramsay, MD [facility] Pager [phone]  If 7PM-7AM, please contact night-coverage [email or web address] Password [facility] 04/13/2016, 9:45 AM

## 2016-04-14 DIAGNOSIS — R1031 Right lower quadrant pain: Secondary | ICD-10-CM

## 2016-04-14 NOTE — Progress Notes (Signed)
Daily Progress Note   Patient Name: Sherri Singh       Date: 0/88/1103 DOB: 1941/06/15  Age: 75 y.o. MRN#: [id number] Attending Physician: Theodis Blaze, MD Primary Care Physician: Lynne Leader, MD Admit Date: 04/16/2016  Reason for Consultation/Follow-up: Establishing goals of care, Non pain symptom management, Pain control and Terminal Care  Subjective: I received a call from her nurse that the patient's family had concerns regarding care plan.  I met with family and we talked at length about plan for comfort moving forward. Their major concern revolved around the fact that she is currently nothing by mouth. We talked about the fact that this decision was made based upon the fact that she was still very nauseous yesterday. As she is now asking for small sips, we talked about the risk of aspiration as well as recommendation to allow sips and bites for comfort with understanding that this can lead to aspiration event.  Length of Stay: 3  Current Medications: Scheduled Meds:  . antiseptic oral rinse  7 mL Mouth Rinse q12n4p  . chlorhexidine  15 mL Mouth Rinse BID  . scopolamine  1 patch Transdermal Q72H    Continuous Infusions: . morphine 1 mg/hr (04/12/16 0949)    PRN Meds: glycopyrrolate, haloperidol lactate, LORazepam **OR** [DISCONTINUED] LORazepam **OR** LORazepam, morphine, ondansetron **OR** ondansetron (ZOFRAN) IV  Physical Exam         General: Elderly female lying in bed. Largely nonresponsive during examination  HEENT: No bruits, no goiter, no JVD Heart: Regular rate and rhythm. No murmur appreciated. Lungs: Fair air movement, clear Abdomen: Soft, nontender, distended, positive bowel sounds.  Ext: Trace edema Skin: Cooling of feet  Vital Signs: BP (!) 108/49 (BP  Location: Left Arm)   Pulse (!) 111   Temp 97.6 F (36.4 C) (Axillary)   Resp 18   SpO2 91%  SpO2: SpO2: 91 % O2 Device: O2 Device: Nasal Cannula O2 Flow Rate: O2 Flow Rate (L/min): 2 L/min  Intake/output summary:  Intake/Output Summary (Last 24 hours) at 04/14/16 0915 Last data filed at 04/14/16 0530  Gross per 24 hour  Intake               60 ml  Output              825 ml  Net             -765 ml   LBM:   Baseline Weight:   Most recent weight:         Palliative Assessment/Data: 20%   Flowsheet Rows   Flowsheet Row Most Recent Value  Intake Tab  Referral Department  Hospitalist  Unit at Time of Referral  Med/Surg Unit  Palliative Care Primary Diagnosis  Cancer  Date Notified  04/10/2016  Palliative Care Type  New Palliative care  Reason for referral  End of Life Care Assistance  Date of Admission  03/29/2016  Date first seen by Palliative Care  04/12/16  # of days Palliative referral response time  1 Day(s)  # of days IP prior to Palliative referral  0  Clinical Assessment  Palliative Performance Scale Score  10%  Pain Max last 24 hours  Not able to report  Pain Min Last 24 hours  Not able to report  Psychosocial & Spiritual Assessment  Palliative Care Outcomes  Patient/Family meeting held?  Yes  Who was at the meeting?  Patient's sons  Palliative Care Outcomes  Improved pain interventions, Improved non-pain symptom therapy      Patient Active Problem List   Diagnosis Date Noted  . Comfort measures only status 04/12/2016  . HTN (hypertension) 04/12/2016  . Hypernatremia 04/12/2016  . Essential hypertension 04/12/2016  . Abdominal aortic aneurysm (AAA) ([place]) 04/12/2016  . Abnormal ECG   . Dysarthria   . Encounter for nasogastric (NG) tube placement   . Generalized weakness   . Dyspnea   . Nausea without vomiting   . Palliative care encounter   . Small bowel obstruction ([place]) 03/28/2016  . SBO (small bowel obstruction) ([place]) 04/22/2016  . UTI (lower  urinary tract infection) 03/20/2016  . Fracture of femoral neck, right, closed 03/20/2016  . Fall   . Protein-calorie malnutrition ([place]) 01/16/2016  . AI (aortic incompetence) 10/18/2015  . DNR (do not resuscitate) 09/14/2015  . Falls frequently 09/13/2015  . Vaginal irritation 07/18/2015  . Aortic valve regurgitation 05/15/2015  . Essential hypertension, benign 01/27/2015  . Essential tremor 01/22/2015  . Tachycardia 01/20/2015  . Bilateral leg edema 01/20/2015  . TOBACCO ABUSE 05/11/2009  . CARCINOMA, LUNG, SMALL CELL 04/27/2009  . COPD (chronic obstructive pulmonary disease) with chronic bronchitis ([place]) 04/27/2009  . 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    Palliative Care Assessment & Plan    Recommendations/Plan:  I met today with patient's son and we called her daughter on the phone. They report being concerned they have made the right decision (to focus on comfort) as she is still alive and they thought that she was going to die the day she was admitted. We again talked at length about the decline in nutrition, functional status, and cognition that has been going on for the last several weeks and the fact that this is likely an indicator that her body was shutting down prior to this acute worsening.  Her family remains in agreement that her wishes would be to focus on comfort at this stage. With further discussion, their main concern is that she has not been able to eat or drink and she had been requesting water because she was thirsty. I talked with family about the risk of aspiration as well as my recommendation to allow her to eat and drink small sips and bites as she desired for comfort.  There and agreement with this plan and will add on comfort feeds as tolerated.  Otherwise, she appears to be resting comfortably and will continue with current care plan.  Goals of Care and Additional Recommendations:  Limitations on Scope of Treatment: Full Comfort Care  Code  Status:    Code Status Orders        Start     Ordered   04/12/16 0533  DNR (Do not attempt resuscitation)  Continuous    Question Answer Comment  In the event of cardiac or respiratory ARREST Do not call a "code blue"   In the event of cardiac or respiratory ARREST Do not perform Intubation, CPR, defibrillation or ACLS   In the event of cardiac or respiratory ARREST Use medication by any route, position, wound care, and other measures to relive pain and suffering. May use oxygen, suction and manual treatment of airway obstruction as needed for comfort.      04/12/16 0532    Code Status History    Date Active Date Inactive Code Status Order ID Comments User Context   04/12/2016  5:27 AM 04/12/2016  5:32 AM DNR [id number]  Norval Morton, MD ED   04/12/2016  1:15 AM 04/12/2016  5:27 AM DNR [id number]  Norval Morton, MD ED   04/13/2016  8:34 PM 04/12/2016  1:15 AM Full Code [id number]  Norval Morton, MD ED   03/20/2016 12:54 AM 03/23/2016  6:58 PM Full Code [id number]  Ivor Costa, MD ED       Prognosis:   Hours - Days  Discharge Planning:  Anticipated Hospital Death  Care plan was discussed with son, daughter, bedside nurse  Thank you for allowing the Palliative Medicine Team to assist in the care of this patient.   Time In: 1110 Time Out: 1150 Total Time 40 Prolonged Time Billed No      Greater than 50%  of this time was spent counseling and coordinating care related to the above assessment and plan.  Micheline Rough, MD  Please contact Palliative Medicine Team phone at [phone] for questions and concerns.

## 2016-04-14 NOTE — Progress Notes (Signed)
Patient ID: Sherri Singh, female   DOB: [birth year], 75 y.o.   MRN: [id number]    PROGRESS NOTE    Sherri Singh  KDT:[id number] DOB: [birth year] DOA: 04/02/2016  PCP: Lynne Leader, MD   Brief Narrative:  75 y.o. female with medical history significant of hypertension, CVA with dysarthria, COPD, former smoker, SCLC (s/p of XRT and chemotherapy, in remission), essential tremor s/p deep brain stimulator, aortic regurgitation; who presented with abdominal pain and generalized weakness. Pt has underlying dysarthria and unable to provide history, had recent hip fracture and repair by Dr. Berenice Primas, was at [facility]. Over the last 10 days she has had a progressive decline.   ED Course: In ED, CT of the abdomen showed a mid to distal small bowel obstruction, T12 compression fracture likely from previous fall, and a 2.3 centimeter infrarenal abdominal aortic aneurysm. Urinalysis was positive for yeast. General surgery was consulted by the ED. Patient was ordered to have NG tube to suction and TRH called to admit.  Assessment & Plan:  SBO and subsequent Hypovolemic shock - PCCM and surgery team consulted - pt looks comfortable this AM but confused - family asked for comfort feeding, this is certainly acceptable - aspiration precautions   Hyponatremia - pre renal - no further tests to ensure comfort   Hypokalemia - from GI losses - no further blood tests as indicated above  Failure to thrive - comfort care   Small cell lung cancer - no further interventions   DVT prophylaxis: SCD's Code Status: DNR Family Communication: no family at bedside this AM  Disposition Plan: to be determined   Consultants:   PCCM  Surgery   Procedures:   None  Antimicrobials:   None   Subjective: Appears comfortable.  Objective: Vitals:   04/12/16 0637 04/12/16 1358 04/13/16 1357 04/14/16 0526  BP: (!) 84/34 (!) 101/36 (!) 109/49 (!) 108/49  Pulse: 93 93 94 (!) 111  Resp:  '20 20 18    '$ Temp: 98.1 F (36.7 C) 98.1 F (36.7 C) 98.2 F (36.8 C) 97.6 F (36.4 C)  TempSrc: Axillary Axillary Axillary Axillary  SpO2: 100% 100% 100% 91%    Intake/Output Summary (Last 24 hours) at 04/14/16 1018 Last data filed at 04/14/16 0530  Gross per 24 hour  Intake               60 ml  Output              825 ml  Net             -765 ml   There were no vitals filed for this visit.  Examination:  General exam: Appears calm and comfortable, moving lines around and more alert but confused  Respiratory system: Respiratory effort normal.Diminished sounds at bases  Cardiovascular system: Tachycardic, No JVD, murmurs, rubs, gallops or clicks. No pedal edema. Gastrointestinal system: Abdomen is distended, hard and nontender.    Data Reviewed: I have personally reviewed following labs and imaging studies  CBC:  Recent Labs Lab 04/09/16 04/16/2016 1140 03/24/2016 2230  WBC 16.7 13.4*  --   NEUTROABS 14,529 11.3*  --   HGB 15.8 14.6 14.6  HCT 49* 45.0 46.3*  MCV  --  95.5  --   PLT 290 206  --    Basic Metabolic Panel:  Recent Labs Lab 04/09/16 04/14/2016 1421  NA 145 146*  K 3.6 3.2*  CL  --  109  CO2  --  29  GLUCOSE  --  98  BUN 64* 37*  CREATININE 1.3* 0.86  CALCIUM  --  10.0   Liver Function Tests:  Recent Labs Lab 04/06/2016 1421  AST 25  ALT 30  ALKPHOS 101  BILITOT 1.2  PROT 5.2*  ALBUMIN 2.9*    Recent Labs Lab 03/27/2016 1421  LIPASE 59*   BNP (last 3 results)  Recent Labs  04/26/15 0940  PROBNP 228.70*   Urine analysis:    Component Value Date/Time   COLORURINE YELLOW 04/01/2016 1521   APPEARANCEUR HAZY (A) 03/30/2016 1521   LABSPEC 1.026 04/20/2016 1521   PHURINE 5.5 03/27/2016 1521   GLUCOSEU NEGATIVE 04/22/2016 1521   HGBUR NEGATIVE 03/26/2016 1521   BILIRUBINUR NEGATIVE 04/17/2016 1521   KETONESUR NEGATIVE 03/30/2016 1521   PROTEINUR NEGATIVE 04/13/2016 1521   UROBILINOGEN 0.2 05/02/2012 0055   NITRITE NEGATIVE 04/15/2016 1521    LEUKOCYTESUR TRACE (A) 04/15/2016 1521   Recent Results (from the past 240 hour(s))  Culture, Urine     Status: Abnormal   Collection Time: 04/09/2016  3:21 PM  Result Value Ref Range Status   Specimen Description URINE, RANDOM  Final   Special Requests NONE  Final   Culture (A)  Final    >=100,000 COLONIES/mL LACTOBACILLUS SPECIES Standardized susceptibility testing for this organism is not available. 20,000 COLONIES/mL YEAST    Report Status 04/13/2016 FINAL  Final  Culture, blood (routine x 2)     Status: None (Preliminary result)   Collection Time: 04/22/2016  8:45 PM  Result Value Ref Range Status   Specimen Description BLOOD LEFT HAND  Final   Special Requests IN PEDIATRIC BOTTLE 2CC  Final   Culture NO GROWTH 2 DAYS  Final   Report Status PENDING  Incomplete  Culture, blood (routine x 2)     Status: None (Preliminary result)   Collection Time: 03/26/2016  9:03 PM  Result Value Ref Range Status   Specimen Description BLOOD RIGHT HAND  Final   Special Requests BOTTLES DRAWN AEROBIC AND ANAEROBIC 5CC  Final   Culture NO GROWTH 2 DAYS  Final   Report Status PENDING  Incomplete  MRSA PCR Screening     Status: None   Collection Time: 04/12/16  7:40 AM  Result Value Ref Range Status   MRSA by PCR NEGATIVE NEGATIVE Final    Comment:        The GeneXpert MRSA Assay (FDA approved for NASAL specimens only), is one component of a comprehensive MRSA colonization surveillance program. It is not intended to diagnose MRSA infection nor to guide or monitor treatment for MRSA infections.      Radiology Studies: No results found. Scheduled Meds: . antiseptic oral rinse  7 mL Mouth Rinse q12n4p  . chlorhexidine  15 mL Mouth Rinse BID  . scopolamine  1 patch Transdermal Q72H   Continuous Infusions: . morphine 1 mg/hr (04/14/16 0937)    LOS: 3 days   Time spent: 20 minutes   Faye Ramsay, MD [facility] Pager [phone]  If 7PM-7AM, please contact  night-coverage [email or web address] Password [facility] 04/14/2016, 10:18 AM

## 2016-04-14 NOTE — Progress Notes (Signed)
Daily Progress Note   Patient Name: Sherri Singh       Date: 04/13/8287 DOB: 04-14-1941  Age: 75 y.o. MRN#: [id number] Attending Physician: Theodis Blaze, MD Primary Care Physician: Lynne Leader, MD Admit Date: 04/17/2016  Reason for Consultation/Follow-up: Establishing goals of care, Non pain symptom management, Pain control and Terminal Care  Subjective: I met with patient's son.  He reports his mother is been much more comfortable today. She still has some periods of awakening and saying one or 2 words. Overall she continues to decline and has had no meaningful by mouth intake (few small sips or bites for comfort).  Length of Stay: 3  Current Medications: Scheduled Meds:  . antiseptic oral rinse  7 mL Mouth Rinse q12n4p  . chlorhexidine  15 mL Mouth Rinse BID  . scopolamine  1 patch Transdermal Q72H    Continuous Infusions: . morphine 1 mg/hr (04/14/16 0937)    PRN Meds: glycopyrrolate, haloperidol lactate, LORazepam **OR** [DISCONTINUED] LORazepam **OR** LORazepam, morphine, ondansetron **OR** ondansetron (ZOFRAN) IV  Physical Exam         General: Elderly female lying in bed. Largely nonresponsive during examination  HEENT: No bruits, no goiter, no JVD Heart: Regular rate and rhythm. No murmur appreciated. Lungs: Fair air movement, clear Abdomen: Soft, nontender, distended, positive bowel sounds.  Ext: Trace edema Skin: Cooling of feet  Vital Signs: BP (!) 108/49 (BP Location: Left Arm)   Pulse (!) 111   Temp 97.6 F (36.4 C) (Axillary)   Resp 18   SpO2 91%  SpO2: SpO2: 91 % O2 Device: O2 Device: Nasal Cannula O2 Flow Rate: O2 Flow Rate (L/min): 2 L/min  Intake/output summary:   Intake/Output Summary (Last 24 hours) at 04/14/16 1747 Last data filed at  04/14/16 1350  Gross per 24 hour  Intake           238.08 ml  Output              600 ml  Net          -361.92 ml   LBM:   Baseline Weight:   Most recent weight:         Palliative Assessment/Data: 20%   Flowsheet Rows   Flowsheet Row Most Recent Value  Intake Tab  Referral Department  Hospitalist  Unit  at Time of Referral  Med/Surg Unit  Palliative Care Primary Diagnosis  Cancer  Date Notified  04/17/2016  Palliative Care Type  New Palliative care  Reason for referral  End of Life Care Assistance  Date of Admission  04/15/2016  Date first seen by Palliative Care  04/12/16  # of days Palliative referral response time  1 Day(s)  # of days IP prior to Palliative referral  0  Clinical Assessment  Palliative Performance Scale Score  10%  Pain Max last 24 hours  Not able to report  Pain Min Last 24 hours  Not able to report  Psychosocial & Spiritual Assessment  Palliative Care Outcomes  Patient/Family meeting held?  Yes  Who was at the meeting?  Patient's sons  Palliative Care Outcomes  Improved pain interventions, Improved non-pain symptom therapy      Patient Active Problem List   Diagnosis Date Noted  . RLQ abdominal pain   . Comfort measures only status 04/12/2016  . HTN (hypertension) 04/12/2016  . Hypernatremia 04/12/2016  . Essential hypertension 04/12/2016  . Abdominal aortic aneurysm (AAA) ([place]) 04/12/2016  . Abnormal ECG   . Dysarthria   . Encounter for nasogastric (NG) tube placement   . Generalized weakness   . Dyspnea   . Nausea without vomiting   . Palliative care encounter   . Small bowel obstruction ([place]) 04/20/2016  . SBO (small bowel obstruction) ([place]) 04/21/2016  . UTI (lower urinary tract infection) 03/20/2016  . Fracture of femoral neck, right, closed 03/20/2016  . Fall   . Protein-calorie malnutrition ([place]) 01/16/2016  . AI (aortic incompetence) 10/18/2015  . DNR (do not resuscitate) 09/14/2015  . Falls frequently 09/13/2015  . Vaginal  irritation 07/18/2015  . Aortic valve regurgitation 05/15/2015  . Essential hypertension, benign 01/27/2015  . Essential tremor 01/22/2015  . Tachycardia 01/20/2015  . Bilateral leg edema 01/20/2015  . TOBACCO ABUSE 05/11/2009  . CARCINOMA, LUNG, SMALL CELL 04/27/2009  . COPD (chronic obstructive pulmonary disease) with chronic bronchitis ([place]) 04/27/2009  . 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    Palliative Care Assessment & Plan    Recommendations/Plan:  Patient is actively dying.  Continue comfort care  Goals of Care and Additional Recommendations:  Limitations on Scope of Treatment: Full Comfort Care  Code Status:    Code Status Orders        Start     Ordered   04/12/16 0533  DNR (Do not attempt resuscitation)  Continuous    Question Answer Comment  In the event of cardiac or respiratory ARREST Do not call a "code blue"   In the event of cardiac or respiratory ARREST Do not perform Intubation, CPR, defibrillation or ACLS   In the event of cardiac or respiratory ARREST Use medication by any route, position, wound care, and other measures to relive pain and suffering. May use oxygen, suction and manual treatment of airway obstruction as needed for comfort.      04/12/16 0532    Code Status History    Date Active Date Inactive Code Status Order ID Comments User Context   04/12/2016  5:27 AM 04/12/2016  5:32 AM DNR [id number]  Norval Morton, MD ED   04/12/2016  1:15 AM 04/12/2016  5:27 AM DNR [id number]  Norval Morton, MD ED   04/14/2016  8:34 PM 04/12/2016  1:15 AM Full Code [id number]  Norval Morton, MD ED   03/20/2016 12:54 AM 03/23/2016  6:58 PM Full Code [id number]  Ivor Costa,  MD ED       Prognosis:   Hours - Days  Discharge Planning:  Anticipated Hospital Death  Care plan was discussed with son, bedside nurse  Thank you for allowing the Palliative Medicine Team to assist in the care of this patient.   Time In: 1515 Time Out: 1535 Total Time 20 Prolonged  Time Billed No      Greater than 50%  of this time was spent counseling and coordinating care related to the above assessment and plan.  Micheline Rough, MD  Please contact Palliative Medicine Team phone at [phone] for questions and concerns.

## 2016-04-14 NOTE — Progress Notes (Addendum)
200 ml of morphine drip wasted in the sink and witnessed by Barrett Henle, RN.  Wasted due to bag expiring. I witnessed the waste of 231m of morphine drip by JHulan Amato due to bag had expired.

## 2016-04-15 NOTE — Progress Notes (Signed)
Nutrition Brief Note  Chart reviewed. Pt now transitioning to comfort care.  No further nutrition interventions warranted at this time.  Please re-consult as needed.   Shaurya Rawdon A. Candita Borenstein, RD, LDN, CDE Pager: [phone] After hours Pager: [phone]  

## 2016-04-15 NOTE — Progress Notes (Addendum)
Patient ID: Sherri Singh, female   DOB: [birth year], 75 y.o.   MRN: [id number]    PROGRESS NOTE    KAWENA LYDAY  MPN:[id number] DOB: Feb 18, 1941 DOA: 03/23/2016  PCP: Lynne Leader, MD   Brief Narrative:  75 y.o. female with medical history significant of hypertension, CVA with dysarthria, COPD, former smoker, SCLC (s/p of XRT and chemotherapy, in remission), essential tremor s/p deep brain stimulator, aortic regurgitation; who presented with abdominal pain and generalized weakness. Pt has underlying dysarthria and unable to provide history, had recent hip fracture and repair by Dr. Berenice Primas, was at [facility] place. Over the last 10 days she has had a progressive decline.   ED Course: In ED, CT of the abdomen showed a mid to distal small bowel obstruction, T12 compression fracture likely from previous fall, and a 2.3 centimeter infrarenal abdominal aortic aneurysm. Urinalysis was positive for yeast. General surgery was consulted by the ED. Patient was ordered to have NG tube to suction and TRH called to admit.  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  Objective: Vitals:   04/12/16 1358 04/13/16 1357 04/14/16 0526 04/15/16 0505  BP: (!) 101/36 (!) 109/49 (!) 108/49 (!) 98/56  Pulse: 93 94 (!) 111 (!) 112  Resp: '20 20 18 15  '$ Temp: 98.1 F (36.7 C) 98.2 F (36.8 C) 97.6 F (36.4 C) 98.2 F (36.8 C)  TempSrc: Axillary Axillary Axillary Axillary  SpO2: 100% 100% 91% 91%    Intake/Output Summary (Last 24 hours) at 04/15/16 1215 Last data filed at 04/15/16 0509  Gross per 24 hour  Intake           127.58 ml  Output              225 ml  Net           -97.42 ml   There were no vitals filed for this visit.  Examination:  General exam: Appears calm and comfortable, moving lines around and more alert but confused  Respiratory system: Respiratory effort normal.Diminished sounds at bases  Cardiovascular system: Tachycardic, No JVD, murmurs, rubs, gallops or clicks. No pedal edema. Gastrointestinal system: Abdomen is distended, hard and nontender.    Data Reviewed: I have personally reviewed following labs and imaging studies  CBC:  Recent Labs Lab 04/09/16 03/26/2016 1140 04/15/2016 2230  WBC 16.7 13.4*  --   NEUTROABS 14,529 11.3*  --   HGB 15.8 14.6 14.6  HCT 49* 45.0 46.3*  MCV  --  95.5  --   PLT 290 206  --    Basic Metabolic Panel:  Recent  Labs Lab 04/09/16 04/14/2016 1421  NA 145 146*  K 3.6 3.2*  CL  --  109  CO2  --  29  GLUCOSE  --  98  BUN 64* 37*  CREATININE 1.3* 0.86  CALCIUM  --  10.0   Liver Function Tests:  Recent Labs Lab 04/07/2016 1421  AST 25  ALT 30  ALKPHOS 101  BILITOT 1.2  PROT 5.2*  ALBUMIN 2.9*    Recent Labs Lab 03/23/2016 1421  LIPASE 59*   BNP (last 3 results)  Recent Labs  04/26/15 0940  PROBNP 228.70*   Urine analysis:    Component Value Date/Time   COLORURINE YELLOW 04/04/2016 1521   APPEARANCEUR HAZY (A) 04/17/2016 1521   LABSPEC 1.026 04/03/2016 1521   PHURINE 5.5 04/14/2016 1521   GLUCOSEU NEGATIVE 04/13/2016 1521   HGBUR NEGATIVE 04/06/2016 1521   BILIRUBINUR NEGATIVE 03/27/2016  1521   KETONESUR NEGATIVE 04/09/2016 1521   PROTEINUR NEGATIVE 03/29/2016 1521   UROBILINOGEN 0.2 05/02/2012 0055   NITRITE NEGATIVE 04/01/2016 1521   LEUKOCYTESUR TRACE (A) 04/04/2016 1521   Recent Results (from the past 240 hour(s))  Culture, Urine     Status: Abnormal   Collection Time: 04/10/2016  3:21 PM  Result Value Ref Range Status   Specimen Description URINE, RANDOM  Final   Special Requests NONE  Final   Culture (A)  Final    >=100,000 COLONIES/mL LACTOBACILLUS SPECIES Standardized susceptibility testing for this organism is not available. 20,000 COLONIES/mL YEAST    Report Status 04/13/2016 FINAL  Final  Culture, blood (routine x 2)     Status: None (Preliminary result)   Collection Time: 04/09/2016  8:45 PM  Result Value Ref Range Status   Specimen Description BLOOD LEFT HAND  Final   Special Requests IN PEDIATRIC BOTTLE 2CC  Final   Culture NO GROWTH 3 DAYS  Final   Report Status PENDING  Incomplete  Culture, blood (routine x 2)     Status: None (Preliminary result)   Collection Time: 04/07/2016  9:03 PM  Result Value Ref Range Status   Specimen Description BLOOD RIGHT HAND  Final   Special Requests BOTTLES DRAWN AEROBIC AND ANAEROBIC 5CC  Final   Culture NO GROWTH 3 DAYS  Final   Report Status PENDING  Incomplete  MRSA PCR Screening     Status: None   Collection Time: 04/12/16  7:40 AM  Result Value Ref Range Status   MRSA by PCR NEGATIVE NEGATIVE Final    Comment:        The GeneXpert MRSA Assay (FDA approved for NASAL specimens only), is one component of a comprehensive MRSA colonization surveillance program. It is not intended to diagnose MRSA infection nor to guide or monitor treatment for MRSA infections.      Radiology Studies: No results found. Scheduled Meds: . antiseptic oral rinse  7 mL Mouth Rinse q12n4p  . chlorhexidine  15 mL Mouth Rinse BID  . scopolamine  1 patch Transdermal Q72H   Continuous Infusions: . morphine 1 mg/hr (04/14/16  0937)    LOS: 4 days   Time spent: 20 minutes   Faye Ramsay, MD [facility] Pager [phone]  If 7PM-7AM, please contact night-coverage [email or web address] Password [facility] 04/15/2016, 12:15 PM

## 2016-04-15 NOTE — Care Management Important Message (Signed)
Important Message  Patient Details  Name: Sherri Singh MRN: [id number] Date of Birth: 1941-08-29   Medicare Important Message Given:  Yes    Matei Magnone, Leroy Sea 04/15/2016, 11:10 AM

## 2016-04-15 NOTE — Progress Notes (Signed)
Daily Progress Note   Patient Name: Sherri Singh       Date: 2/58/5277 DOB: Apr 14, 1941  Age: 75 y.o. MRN#: [id number] Attending Physician: Theodis Blaze, MD Primary Care Physician: Lynne Leader, MD Admit Date: 04/10/2016  Reason for Consultation/Follow-up: Establishing goals of care, Non pain symptom management, Pain control and Terminal Care  Subjective: Patient seen and examined this morning. She opened eyes to tactile stimulation but did not really participate in meaningful conversation.   Overall she continues to decline and has had no meaningful by mouth intake (few small sips or bites for comfort).  Length of Stay: 4  Current Medications: Scheduled Meds:  . antiseptic oral rinse  7 mL Mouth Rinse q12n4p  . chlorhexidine  15 mL Mouth Rinse BID  . scopolamine  1 patch Transdermal Q72H    Continuous Infusions: . morphine 1 mg/hr (04/14/16 0937)    PRN Meds: glycopyrrolate, haloperidol lactate, LORazepam **OR** [DISCONTINUED] LORazepam **OR** LORazepam, morphine, ondansetron **OR** ondansetron (ZOFRAN) IV  Physical Exam         General: Elderly female lying in bed. Largely nonresponsive during examination  HEENT: No bruits, no goiter, no JVD Heart: Regular rate and rhythm. No murmur appreciated. Lungs: Fair air movement, clear Abdomen: Soft, nontender, distended, positive bowel sounds.  Ext: Trace edema Skin: Cooling of feet  Vital Signs: BP (!) 98/56 (BP Location: Left Arm)   Pulse (!) 112   Temp 98.2 F (36.8 C) (Axillary)   Resp 15   SpO2 91%  SpO2: SpO2: 91 % O2 Device: O2 Device: Nasal Cannula O2 Flow Rate: O2 Flow Rate (L/min): 2 L/min  Intake/output summary:   Intake/Output Summary (Last 24 hours) at 04/15/16 1105 Last data filed at 04/15/16 4431  Gross per 24 hour  Intake           127.58 ml  Output              225 ml  Net           -97.42 ml   LBM: Last BM Date:  (unknown) Baseline Weight:   Most recent weight:         Palliative Assessment/Data: 20%   Flowsheet Rows   Flowsheet Row Most Recent Value  Intake Tab  Referral Department  Hospitalist  Unit at Time of Referral  Med/Surg Unit  Palliative Care Primary Diagnosis  Cancer  Date Notified  03/28/2016  Palliative Care Type  New Palliative care  Reason for referral  End of Life Care Assistance  Date of Admission  03/26/2016  Date first seen by Palliative Care  04/12/16  # of days Palliative referral response time  1 Day(s)  # of days IP prior to Palliative referral  0  Clinical Assessment  Palliative Performance Scale Score  10%  Pain Max last 24 hours  Not able to report  Pain Min Last 24 hours  Not able to report  Psychosocial & Spiritual Assessment  Palliative Care Outcomes  Patient/Family meeting held?  Yes  Who was at the meeting?  Patient's sons  Palliative Care Outcomes  Improved pain interventions, Improved non-pain symptom therapy      Patient Active Problem List   Diagnosis Date Noted  . RLQ abdominal pain   . Comfort measures only status 04/12/2016  . HTN (hypertension) 04/12/2016  . Hypernatremia 04/12/2016  . Essential hypertension 04/12/2016  . Abdominal aortic aneurysm (AAA) ([place]) 04/12/2016  . Abnormal ECG   . Dysarthria   . Encounter for nasogastric (NG) tube placement   . Generalized weakness   . Dyspnea   . Nausea without vomiting   . Palliative care encounter   . Small bowel obstruction ([place]) 03/26/2016  . SBO (small bowel obstruction) ([place]) 04/17/2016  . UTI (lower urinary tract infection) 03/20/2016  . Fracture of femoral neck, right, closed 03/20/2016  . Fall   . Protein-calorie malnutrition ([place]) 01/16/2016  . AI (aortic incompetence) 10/18/2015  . DNR (do not resuscitate) 09/14/2015  . Falls frequently 09/13/2015  .  Vaginal irritation 07/18/2015  . Aortic valve regurgitation 05/15/2015  . Essential hypertension, benign 01/27/2015  . Essential tremor 01/22/2015  . Tachycardia 01/20/2015  . Bilateral leg edema 01/20/2015  . TOBACCO ABUSE 05/11/2009  . CARCINOMA, LUNG, SMALL CELL 04/27/2009  . COPD (chronic obstructive pulmonary disease) with chronic bronchitis ([place]) 04/27/2009  . 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    Palliative Care Assessment & Plan    Recommendations/Plan:  Patient is actively dying.  Continue comfort care  Goals of Care and Additional Recommendations:  Limitations on Scope of Treatment: Full Comfort Care  Code Status:    Code Status Orders        Start     Ordered   04/12/16 0533  DNR (Do not attempt resuscitation)  Continuous    Question Answer Comment  In the event of cardiac or respiratory ARREST Do not call a "code blue"   In the event of cardiac or respiratory ARREST Do not perform Intubation, CPR, defibrillation or ACLS   In the event of cardiac or respiratory ARREST Use medication by any route, position, wound care, and other measures to relive pain and suffering. May use oxygen, suction and manual treatment of airway obstruction as needed for comfort.      04/12/16 0532    Code Status History    Date Active Date Inactive Code Status Order ID Comments User Context   04/12/2016  5:27 AM 04/12/2016  5:32 AM DNR [id number]  Norval Morton, MD ED   04/12/2016  1:15 AM 04/12/2016  5:27 AM DNR [id number]  Norval Morton, MD ED   04/01/2016  8:34 PM 04/12/2016  1:15 AM Full Code [id number]  Norval Morton, MD ED   03/20/2016 12:54 AM 03/23/2016  6:58 PM Full Code [id number]  Ivor Costa, MD ED  Prognosis:   Hours - Days  Discharge Planning:  Anticipated Hospital Death  Care plan was discussed with son, bedside nurse  Thank you for allowing the Palliative Medicine Team to assist in the care of this patient.   Time In: 0900 Time Out: 0915 Total Time 15  Prolonged Time Billed No      Greater than 50%  of this time was spent counseling and coordinating care related to the above assessment and plan.  Micheline Rough, MD  Please contact Palliative Medicine Team phone at [phone] for questions and concerns.

## 2016-04-16 LAB — CULTURE, BLOOD (ROUTINE X 2)
Culture: NO GROWTH
Culture: NO GROWTH

## 2016-04-16 NOTE — Progress Notes (Signed)
PMT RN Note: Discussed pt in team rounds. Recommend transfer to residential hospice if pt is stable and ok with attending.  Marjie Skiff Carisha Kantor, RN, BSN, [facility] 04/16/2016 12:17 PM Cell [phone] 8:00-4:00 Monday-Friday Office [phone]

## 2016-04-16 NOTE — Progress Notes (Signed)
Patient ID: Sherri Singh, female   DOB: [birth year], 75 y.o.   MRN: [id number]    PROGRESS NOTE    Sherri Singh  AGT:[id number] DOB: Mar 29, 1941 DOA: 03/27/2016  PCP: Sherri Leader, MD   Brief Narrative:  75 y.o. female with medical history significant of hypertension, CVA with dysarthria, COPD, former smoker, SCLC (s/p of XRT and chemotherapy, in remission), essential tremor s/p deep brain stimulator, aortic regurgitation; who presented with abdominal pain and generalized weakness. Pt has underlying dysarthria and unable to provide history, had recent hip fracture and repair by Dr. Berenice Primas, was at [facility] place. Over the last 10 days she has had a progressive decline.   ED Course: In ED, CT of the abdomen showed a mid to distal small bowel obstruction, T12 compression fracture likely from previous fall, and a 2.3 centimeter infrarenal abdominal aortic aneurysm. Urinalysis was positive for yeast. General surgery was consulted by the ED. Patient was ordered to have NG tube to suction and TRH called to admit.  Assessment & Plan:  SBO and subsequent Hypovolemic shock - PCCM and surgery team consulted - pt now full comfort and PCT following  - family asked for comfort feeding, this is certainly acceptable if pt is alert enough but this AM she more lethargic and not opening eyes - aspiration precautions  - appreciate PCT assistance   Severe PCM - now still NPO but diet allowed if pt able to take  - pt cachectic and with BMP < 18  Hyponatremia - pre renal - no further tests to ensure comfort   Hypokalemia - from GI losses - no further blood tests as indicated above  Failure to thrive - comfort care   Small cell lung cancer - no further interventions   DVT prophylaxis: SCD's Code Status: DNR Family Communication: no family at bedside this AM  Disposition Plan: in hospital death expected per PCT  Consultants:   PCCM  Surgery   PCT  Procedures:   None  Antimicrobials:    None   Subjective: Appears comfortable.  Objective: Vitals:   04/13/16 1357 04/14/16 0526 04/15/16 0505 04/16/16 0522  BP: (!) 109/49 (!) 108/49 (!) 98/56 (!) 135/58  Pulse: 94 (!) 111 (!) 112 (!) 115  Resp: '20 18 15 17  '$ Temp: 98.2 F (36.8 C) 97.6 F (36.4 C) 98.2 F (36.8 C) 98 F (36.7 C)  TempSrc: Axillary Axillary Axillary Oral  SpO2: 100% 91% 91% (!) 89%    Intake/Output Summary (Last 24 hours) at 04/16/16 1057 Last data filed at 04/16/16 0608  Gross per 24 hour  Intake                0 ml  Output              750 ml  Net             -750 ml   There were no vitals filed for this visit.  Examination:  General exam: Appears lethargic, non verbal, NAD  Respiratory system: Respiratory effort normal.Diminished sounds at bases  Cardiovascular system: Tachycardic, No JVD, murmurs, rubs, gallops or clicks. No pedal edema. Gastrointestinal system: Abdomen is distended, hard and nontender.    Data Reviewed: I have personally reviewed following labs and imaging studies  CBC:  Recent Labs Lab 03/31/2016 1140 03/26/2016 2230  WBC 13.4*  --   NEUTROABS 11.3*  --   HGB 14.6 14.6  HCT 45.0 46.3*  MCV 95.5  --   PLT 206  --  Basic Metabolic Panel:  Recent Labs Lab 04/22/2016 1421  NA 146*  K 3.2*  CL 109  CO2 29  GLUCOSE 98  BUN 37*  CREATININE 0.86  CALCIUM 10.0   Liver Function Tests:  Recent Labs Lab 03/29/2016 1421  AST 25  ALT 30  ALKPHOS 101  BILITOT 1.2  PROT 5.2*  ALBUMIN 2.9*    Recent Labs Lab 04/04/2016 1421  LIPASE 59*   BNP (last 3 results)  Recent Labs  04/26/15 0940  PROBNP 228.70*   Urine analysis:    Component Value Date/Time   COLORURINE YELLOW 04/02/2016 1521   APPEARANCEUR HAZY (A) 04/12/2016 1521   LABSPEC 1.026 04/21/2016 1521   PHURINE 5.5 03/27/2016 1521   GLUCOSEU NEGATIVE 04/09/2016 1521   HGBUR NEGATIVE 04/02/2016 1521   BILIRUBINUR NEGATIVE 03/28/2016 1521   KETONESUR NEGATIVE 03/27/2016 1521    PROTEINUR NEGATIVE 03/24/2016 1521   UROBILINOGEN 0.2 05/02/2012 0055   NITRITE NEGATIVE 04/12/2016 1521   LEUKOCYTESUR TRACE (A) 03/23/2016 1521   Recent Results (from the past 240 hour(s))  Culture, Urine     Status: Abnormal   Collection Time: 04/10/2016  3:21 PM  Result Value Ref Range Status   Specimen Description URINE, RANDOM  Final   Special Requests NONE  Final   Culture (A)  Final    >=100,000 COLONIES/mL LACTOBACILLUS SPECIES Standardized susceptibility testing for this organism is not available. 20,000 COLONIES/mL YEAST    Report Status 04/13/2016 FINAL  Final  Culture, blood (routine x 2)     Status: None (Preliminary result)   Collection Time: 04/20/2016  8:45 PM  Result Value Ref Range Status   Specimen Description BLOOD LEFT HAND  Final   Special Requests IN PEDIATRIC BOTTLE 2CC  Final   Culture NO GROWTH 4 DAYS  Final   Report Status PENDING  Incomplete  Culture, blood (routine x 2)     Status: None (Preliminary result)   Collection Time: 03/23/2016  9:03 PM  Result Value Ref Range Status   Specimen Description BLOOD RIGHT HAND  Final   Special Requests BOTTLES DRAWN AEROBIC AND ANAEROBIC 5CC  Final   Culture NO GROWTH 4 DAYS  Final   Report Status PENDING  Incomplete  MRSA PCR Screening     Status: None   Collection Time: 04/12/16  7:40 AM  Result Value Ref Range Status   MRSA by PCR NEGATIVE NEGATIVE Final    Comment:        The GeneXpert MRSA Assay (FDA approved for NASAL specimens only), is one component of a comprehensive MRSA colonization surveillance program. It is not intended to diagnose MRSA infection nor to guide or monitor treatment for MRSA infections.      Radiology Studies: No results found. Scheduled Meds: . antiseptic oral rinse  7 mL Mouth Rinse q12n4p  . chlorhexidine  15 mL Mouth Rinse BID  . scopolamine  1 patch Transdermal Q72H   Continuous Infusions: . morphine 1 mg/hr (04/14/16 0937)    LOS: 5 days   Time spent: 20 minutes    Faye Ramsay, MD [facility] Pager [phone]  If 7PM-7AM, please contact night-coverage [email or web address] Password [facility] 04/16/2016, 10:57 AM

## 2016-04-17 NOTE — Progress Notes (Signed)
PMT RN Note: Saw and spoke with pt (limited interaction), who indicated she has nausea. She is grimacing and guarding her abdomen. Son is at bedside and feels that pain is not controlled. Respirations are shallow. Pt has BP WNL, but she is tachycardic; along with PAINAD, this indicates objectively that she has uncontrolled pain. While pt did have some PO intake yesterday, she has not had any today except for 2 half ice chips given by PMT RN.   Discussed pt with Dr Hilma Favors. She gave orders to increase morphine drip to 2 mg/hour with same clinician bolus dose. Pt was previously stable for transfer to residential hospice. PMT recommends waiting to see how she responds to increased morphine. I did begin the discussion regarding residential hospice with her son. He will absolutely not agree today, as he has multiple siblings to discuss this with. Plan f/u tomorrow am with PMT member to evaluate whether she remains stable for discharge, which is questionable at best today, and to f/u on medication changes and symptoms.  Marjie Skiff Janeth Terry, RN, BSN, [facility] 04/17/2016 11:47 AM Cell [phone] 8:00-4:00 Monday-Friday Office [phone]

## 2016-04-17 NOTE — Progress Notes (Signed)
Patient ID: Sherri Singh, female   DOB: [birth year], 75 y.o.   MRN: [id number]    PROGRESS NOTE    Sherri Singh  JSH:[id number] DOB: Apr 15, 1941 DOA: 04/12/2016  PCP: Lynne Leader, MD   Brief Narrative:  75 y.o. female with medical history significant of hypertension, CVA with dysarthria, COPD, former smoker, SCLC (s/p of XRT and chemotherapy, in remission), essential tremor s/p deep brain stimulator, aortic regurgitation; who presented with abdominal pain and generalized weakness. Pt has underlying dysarthria and unable to provide history, had recent hip fracture and repair by Dr. Berenice Primas, was at [facility] place. Over the last 10 days she has had a progressive decline.   ED Course: In ED, CT of the abdomen showed a mid to distal small bowel obstruction, T12 compression fracture likely from previous fall, and a 2.3 centimeter infrarenal abdominal aortic aneurysm. Urinalysis was positive for yeast. General surgery was consulted by the ED. Patient was ordered to have NG tube to suction and TRH called to admit.  Assessment & Plan:  SBO and subsequent Hypovolemic shock - PCCM and surgery team consulted and signed off as pt now full comfort  - family asked for comfort feeding, this is certainly acceptable if pt is alert enough but this AM she more lethargic and not opening eyes - aspiration precautions  - appreciate PCT assistance  - ? D/c to residential hospice if PCT team thinks this is reasonable   Severe PCM - now still NPO but diet allowed if pt able to take  - pt cachectic and with BMP < 18  Hyponatremia - pre renal - no further tests to ensure comfort   Hypokalemia - from GI losses - no further blood tests as indicated above  Failure to thrive - comfort care   Small cell lung cancer - no further interventions   DVT prophylaxis: SCD's Code Status: DNR Family Communication: no family at bedside this AM  Disposition Plan: in hospital death expected per PCT, ? If reasonable  to consider residential hospice   Consultants:   PCCM  Surgery   PCT  Procedures:   None  Antimicrobials:   None   Subjective: Appears comfortable.  Objective: Vitals:   04/14/16 0526 04/15/16 0505 04/16/16 0522 04/17/16 0502  BP: (!) 108/49 (!) 98/56 (!) 135/58 123/79  Pulse: (!) 111 (!) 112 (!) 115 (!) 118  Resp: '18 15 17 20  '$ Temp: 97.6 F (36.4 C) 98.2 F (36.8 C) 98 F (36.7 C) 98.1 F (36.7 C)  TempSrc: Axillary Axillary Oral Axillary  SpO2: 91% 91% (!) 89% 94%    Intake/Output Summary (Last 24 hours) at 04/17/16 0841 Last data filed at 04/17/16 0542  Gross per 24 hour  Intake               50 ml  Output              575 ml  Net             -525 ml   There were no vitals filed for this visit.  Examination:  General exam: Appears lethargic, non verbal, NAD  Respiratory system: Respiratory effort normal.Diminished sounds at bases  Cardiovascular system: Tachycardic, No JVD, murmurs, rubs, gallops or clicks. No pedal edema. Gastrointestinal system: Abdomen is distended, hard and nontender.    Data Reviewed: I have personally reviewed following labs and imaging studies  CBC:  Recent Labs Lab 03/23/2016 1140 03/28/2016 2230  WBC 13.4*  --   NEUTROABS 11.3*  --  HGB 14.6 14.6  HCT 45.0 46.3*  MCV 95.5  --   PLT 206  --    Basic Metabolic Panel:  Recent Labs Lab 04/12/2016 1421  NA 146*  K 3.2*  CL 109  CO2 29  GLUCOSE 98  BUN 37*  CREATININE 0.86  CALCIUM 10.0   Liver Function Tests:  Recent Labs Lab 04/08/2016 1421  AST 25  ALT 30  ALKPHOS 101  BILITOT 1.2  PROT 5.2*  ALBUMIN 2.9*    Recent Labs Lab 04/13/2016 1421  LIPASE 59*   BNP (last 3 results)  Recent Labs  04/26/15 0940  PROBNP 228.70*   Urine analysis:    Component Value Date/Time   COLORURINE YELLOW 04/01/2016 1521   APPEARANCEUR HAZY (A) 04/13/2016 1521   LABSPEC 1.026 04/02/2016 1521   PHURINE 5.5 03/30/2016 1521   GLUCOSEU NEGATIVE 03/23/2016 1521    HGBUR NEGATIVE 03/28/2016 1521   BILIRUBINUR NEGATIVE 04/03/2016 1521   KETONESUR NEGATIVE 04/07/2016 1521   PROTEINUR NEGATIVE 04/16/2016 1521   UROBILINOGEN 0.2 05/02/2012 0055   NITRITE NEGATIVE 04/06/2016 1521   LEUKOCYTESUR TRACE (A) 04/01/2016 1521   Recent Results (from the past 240 hour(s))  Culture, Urine     Status: Abnormal   Collection Time: 03/29/2016  3:21 PM  Result Value Ref Range Status   Specimen Description URINE, RANDOM  Final   Special Requests NONE  Final   Culture (A)  Final    >=100,000 COLONIES/mL LACTOBACILLUS SPECIES Standardized susceptibility testing for this organism is not available. 20,000 COLONIES/mL YEAST    Report Status 04/13/2016 FINAL  Final  Culture, blood (routine x 2)     Status: None   Collection Time: 03/30/2016  8:45 PM  Result Value Ref Range Status   Specimen Description BLOOD LEFT HAND  Final   Special Requests IN PEDIATRIC BOTTLE 2CC  Final   Culture NO GROWTH 5 DAYS  Final   Report Status 04/16/2016 FINAL  Final  Culture, blood (routine x 2)     Status: None   Collection Time: 03/28/2016  9:03 PM  Result Value Ref Range Status   Specimen Description BLOOD RIGHT HAND  Final   Special Requests BOTTLES DRAWN AEROBIC AND ANAEROBIC 5CC  Final   Culture NO GROWTH 5 DAYS  Final   Report Status 04/16/2016 FINAL  Final  MRSA PCR Screening     Status: None   Collection Time: 04/12/16  7:40 AM  Result Value Ref Range Status   MRSA by PCR NEGATIVE NEGATIVE Final    Comment:        The GeneXpert MRSA Assay (FDA approved for NASAL specimens only), is one component of a comprehensive MRSA colonization surveillance program. It is not intended to diagnose MRSA infection nor to guide or monitor treatment for MRSA infections.      Radiology Studies: No results found. Scheduled Meds: . antiseptic oral rinse  7 mL Mouth Rinse q12n4p  . chlorhexidine  15 mL Mouth Rinse BID  . scopolamine  1 patch Transdermal Q72H   Continuous  Infusions: . morphine 1 mg/hr (04/14/16 0937)    LOS: 6 days   Time spent: 20 minutes   Faye Ramsay, MD [facility] Pager [phone]  If 7PM-7AM, please contact night-coverage [email or web address] Password [facility] 04/17/2016, 8:41 AM

## 2016-04-17 NOTE — Plan of Care (Signed)
Problem: Education: Goal: Knowledge of  General Education information/materials will improve Outcome: Not Applicable Date Met: 69/24/93 Pt is on comfort care  Problem: Pain Managment: Goal: General experience of comfort will improve Outcome: Progressing Morphine drip increased to 76m/hr  Problem: Nutrition: Goal: Adequate nutrition will be maintained Outcome: Not Applicable Date Met: 024/19/91Pt is now npo except for comfort foods

## 2016-04-17 NOTE — Progress Notes (Signed)
Family member refused for [place] to turn the pt.

## 2016-04-17 NOTE — Progress Notes (Signed)
Morphine drip increased to '2mg'$ /hr by palliative care nurse. Pt now npo except for comfort foods

## 2016-04-18 MED ORDER — WHITE PETROLATUM GEL
Status: AC
  Filled 2016-04-18: qty 1

## 2016-04-18 NOTE — Progress Notes (Signed)
Pt expired at 2210 pronounced by 2 RNs. Pt was DNR and comfort care. Death certificate completed.

## 2016-04-18 NOTE — Progress Notes (Signed)
At 2210 pt was not breathing, auscultated heart no heart sounds present, charge RN at bedside, MD paged.  Morphine gtt d/c'd with 150 ml was left in the bag Alena Bills witnessed.   donner called ref # [id number] - not candidate for donation

## 2016-04-18 NOTE — Care Management Important Message (Signed)
Important Message  Patient Details  Name: Sherri Singh MRN: [id number] Date of Birth: 1941/03/27   Medicare Important Message Given:  Yes    Loann Quill 04/15/2016, 11:49 AM

## 2016-04-18 NOTE — Progress Notes (Signed)
PMT RN Note: Arrived as Chartered certified accountant was finishing pt's bath. Pt is staring at the ceiling. She will only meet my gaze when stimulated by both touch and calling her name and only for 2-3 seconds. Yesterday, she would track my movements. Lips are grey. Nailbeds are very pale; extremities cool. Respirations are very shallow with periods of 10 seconds of apnea; rate averages 8 breaths per minute. Family reports asking for morphine doses but pt getting other meds instead; this is not supported by the [facility]. No matter the medications she has gotten, the family feels she is not well-controlled with pain or anxiety.  Patient can only answer questions about pain now, and only if yes/no; speaking one word requires use of abdominal muscles and what appears to be great effort.   Pt remains on O2, which is a life-prolonging therapy. Discussed with family; they agree that stopping O2 is in line with their goals. They state that should choice have to be made, they would rather pt be asleep and comfortable and off O2 than awake and in any pain/distress. Discussed changes in respirations/breathing as death nears; they state that they feel she is working hard to breathe when they are with her.  Spoke w Dr Hilma Favors w PMT. She gave orders to increase continuous morphine infusion; will stop Wood O2 2 hours after the increase. New order also that all ativan doses must be accompanied by morphine doses, as family is very concerned that pt is not being medicated aggressively enough with boluses.  Discussed inpatient hospice. Family remains fearful that she will die alone during the ride to the hospice. Explained that nursing care is completely focused on comfort and that she will get more aggressive symptom management; they state they do not want her moved. Prognosis is likely a few days. Recommend continuing the discussion re: hospice. Plan f/u by PMT tomorrow am at 1000 (will meet again w son). Please call with questions or  concerns.  Marjie Skiff Jillyan Plitt, RN, BSN, [facility] 03/24/2016 9:50 AM Cell [phone] 8:00-4:00 Monday-Friday Office [phone]

## 2016-04-18 NOTE — Progress Notes (Signed)
Patient ID: Sherri Singh, female   DOB: [birth year], 75 y.o.   MRN: [id number]    PROGRESS NOTE    ROGENE METH  VQQ:[id number] DOB: Dec 11, 1940 DOA: 03/30/2016  PCP: Lynne Leader, MD   Brief Narrative:  75 y.o. female with medical history significant of hypertension, CVA with dysarthria, COPD, former smoker, SCLC (s/p of XRT and chemotherapy, in remission), essential tremor s/p deep brain stimulator, aortic regurgitation; who presented with abdominal pain and generalized weakness. Pt has underlying dysarthria and unable to provide history, had recent hip fracture and repair by Dr. Berenice Primas, was at [facility] place. Over the last 10 days she has had a progressive decline.   ED Course: In ED, CT of the abdomen showed a mid to distal small bowel obstruction, T12 compression fracture likely from previous fall, and a 2.3 centimeter infrarenal abdominal aortic aneurysm. Urinalysis was positive for yeast. General surgery was consulted by the ED. Patient was ordered to have NG tube to suction and TRH called to admit.  Assessment & Plan:  SBO and subsequent Hypovolemic shock - PCCM and surgery team consulted and signed off as pt now full comfort  - family asked for comfort feeding, this is certainly acceptable if pt is alert enough but this AM she more lethargic and not opening eyes - aspiration precautions  - appreciate PCT assistance  - was uncomfortable yesterday afternoon requiring increase in morphine dose, now comfortable this AM   Severe PCM - now still NPO but diet allowed if pt able to take  - pt cachectic and with BMP < 18  Hyponatremia - pre renal - no further tests to ensure comfort   Hypokalemia - from GI losses - no further blood tests as indicated above  Failure to thrive - comfort care   Small cell lung cancer - no further interventions   DVT prophylaxis: SCD's Code Status: DNR Family Communication: no family at bedside this AM  Disposition Plan: in hospital death  expected per PCT, ? If reasonable to consider residential hospice   Consultants:   PCCM  Surgery   PCT  Procedures:   None  Antimicrobials:   None   Subjective: Appears comfortable.  Objective: Vitals:   04/15/16 0505 04/16/16 0522 04/17/16 0502 03/24/2016 0700  BP: (!) 98/56 (!) 135/58 123/79 (!) 118/55  Pulse: (!) 112 (!) 115 (!) 118 (!) 106  Resp: '15 17 20 19  '$ Temp: 98.2 F (36.8 C) 98 F (36.7 C) 98.1 F (36.7 C) 97.6 F (36.4 C)  TempSrc: Axillary Oral Axillary Oral  SpO2: 91% (!) 89% 94% 94%    Intake/Output Summary (Last 24 hours) at 03/27/2016 0935 Last data filed at 04/03/2016 0900  Gross per 24 hour  Intake                0 ml  Output               50 ml  Net              -50 ml   There were no vitals filed for this visit.  Examination:  General exam: Appears lethargic, non verbal, NAD  Respiratory system: Respiratory effort normal.Diminished sounds at bases  Cardiovascular system: Tachycardic, No JVD, murmurs, rubs, gallops or clicks. No pedal edema. Gastrointestinal system: Abdomen is distended, hard and nontender.    Data Reviewed: I have personally reviewed following labs and imaging studies  CBC:  Recent Labs Lab 04/07/2016 1140 04/08/2016 2230  WBC 13.4*  --  NEUTROABS 11.3*  --   HGB 14.6 14.6  HCT 45.0 46.3*  MCV 95.5  --   PLT 206  --    Basic Metabolic Panel:  Recent Labs Lab 04/14/2016 1421  NA 146*  K 3.2*  CL 109  CO2 29  GLUCOSE 98  BUN 37*  CREATININE 0.86  CALCIUM 10.0   Liver Function Tests:  Recent Labs Lab 04/01/2016 1421  AST 25  ALT 30  ALKPHOS 101  BILITOT 1.2  PROT 5.2*  ALBUMIN 2.9*    Recent Labs Lab 04/06/2016 1421  LIPASE 59*   BNP (last 3 results)  Recent Labs  04/26/15 0940  PROBNP 228.70*   Urine analysis:    Component Value Date/Time   COLORURINE YELLOW 04/14/2016 1521   APPEARANCEUR HAZY (A) 04/15/2016 1521   LABSPEC 1.026 04/07/2016 1521   PHURINE 5.5 04/13/2016 1521    GLUCOSEU NEGATIVE 03/27/2016 1521   HGBUR NEGATIVE 04/22/2016 1521   BILIRUBINUR NEGATIVE 03/31/2016 1521   KETONESUR NEGATIVE 04/17/2016 1521   PROTEINUR NEGATIVE 04/14/2016 1521   UROBILINOGEN 0.2 05/02/2012 0055   NITRITE NEGATIVE 04/20/2016 1521   LEUKOCYTESUR TRACE (A) 04/07/2016 1521   Recent Results (from the past 240 hour(s))  Culture, Urine     Status: Abnormal   Collection Time: 04/21/2016  3:21 PM  Result Value Ref Range Status   Specimen Description URINE, RANDOM  Final   Special Requests NONE  Final   Culture (A)  Final    >=100,000 COLONIES/mL LACTOBACILLUS SPECIES Standardized susceptibility testing for this organism is not available. 20,000 COLONIES/mL YEAST    Report Status 04/13/2016 FINAL  Final  Culture, blood (routine x 2)     Status: None   Collection Time: 04/04/2016  8:45 PM  Result Value Ref Range Status   Specimen Description BLOOD LEFT HAND  Final   Special Requests IN PEDIATRIC BOTTLE 2CC  Final   Culture NO GROWTH 5 DAYS  Final   Report Status 04/16/2016 FINAL  Final  Culture, blood (routine x 2)     Status: None   Collection Time: 04/01/2016  9:03 PM  Result Value Ref Range Status   Specimen Description BLOOD RIGHT HAND  Final   Special Requests BOTTLES DRAWN AEROBIC AND ANAEROBIC 5CC  Final   Culture NO GROWTH 5 DAYS  Final   Report Status 04/16/2016 FINAL  Final  MRSA PCR Screening     Status: None   Collection Time: 04/12/16  7:40 AM  Result Value Ref Range Status   MRSA by PCR NEGATIVE NEGATIVE Final    Comment:        The GeneXpert MRSA Assay (FDA approved for NASAL specimens only), is one component of a comprehensive MRSA colonization surveillance program. It is not intended to diagnose MRSA infection nor to guide or monitor treatment for MRSA infections.      Radiology Studies: No results found. Scheduled Meds: . antiseptic oral rinse  7 mL Mouth Rinse q12n4p  . chlorhexidine  15 mL Mouth Rinse BID  . scopolamine  1 patch  Transdermal Q72H   Continuous Infusions: . morphine 2 mg/hr (04/17/16 1124)    LOS: 7 days   Time spent: 20 minutes   Faye Ramsay, MD [facility] Pager [phone]  If 7PM-7AM, please contact night-coverage [email or web address] Password [facility] 04/17/2016, 9:35 AM

## 2016-04-23 DEATH — deceased

## 2016-05-24 NOTE — Discharge Summary (Signed)
Death Summary  Sherri Singh:[id number] DOB: Nov 06, 1940 DOA: Apr 18, 2016  PCP: Lynne Leader, MD  Admit date: Apr 18, 2016 Date of Death: 04/25/2016 Time of Death: 22:10 Notification: Lynne Leader, MD notified of death   Brief Narrative:  75 y.o. female with medical history significant of hypertension, CVA with dysarthria, COPD, former smoker, SCLC (s/p of XRT and chemotherapy, in remission), essential tremor s/p deep brain stimulator, aortic regurgitation; who presented with abdominal pain and generalized weakness. Pt has underlying dysarthria and unable to provide history, had recent hip fracture and repair by Dr. Berenice Primas, was at [facility] place. Over the last 10 days she has had a progressive decline.   ED Course: In ED, CT of the abdomen showed a mid to distal small bowel obstruction, T12 compression fracture likely from previous fall, and a 2.3 centimeter infrarenal abdominal aortic aneurysm. Urinalysis was positive for yeast. General surgery was consulted by the ED. Patient was ordered to have NG tube to suction and TRH called to admit.  Assessment & Plan:  SBO and subsequent Hypovolemic shock - PCCM and surgery team consulted and signed off as pt now full comfort  - pt appeared comfortable with no signs of suffering  - appreciate PCT assistance   Severe PCM - now still NPO but diet allowed if pt able to take  - pt cachectic and with BMP < 18  Hyponatremia - pre renal - no further tests done to ensure comfort   Hypokalemia - from GI losses - no further blood tests as indicated above  Failure to thrive - comfort care   Small cell lung cancer - no further interventions    Code Status: DNR  Consultants:   PCCM  Surgery   PCT  Procedures:   None  Antimicrobials:   None    The results of significant diagnostics from this hospitalization (including imaging, microbiology, ancillary and laboratory) are listed below for reference.    Significant  Diagnostic Studies: Ct Abdomen Pelvis Wo Contrast  Result Date: 18-Apr-2016 CLINICAL DATA:  Progressive diffuse abdominal pain with nausea and vomiting for 1 week. Leukocytosis. Personal history of left small cell lung carcinoma. EXAM: CT ABDOMEN AND PELVIS WITHOUT CONTRAST TECHNIQUE: Multidetector CT imaging of the abdomen and pelvis was performed following the standard protocol without IV contrast. COMPARISON:  PET-CT on 05/02/2009 FINDINGS: Lower chest:  No acute findings. Hepatobiliary: No mass visualized on this un-enhanced exam. Small cyst in dome of right hepatic lobe is stable. Gallbladder is unremarkable. Pancreas: No mass or inflammatory process identified on this un-enhanced exam. Spleen: Within normal limits in size. Adrenals/Urinary Tract: Stable small low-attenuation left adrenal mass is consistent with a benign adenoma. Fluid attenuation renal cysts again seen in both kidneys. Large cyst in the right kidney and shows a thin calcified septation, without change in appearance since previous study. No evidence of ureteral calculi or dilatation. A few tiny calculi are seen along the left posterior bladder wall. Stomach/Bowel: Stomach, proximal, and mid small bowel loops are moderately dilated, with transition point to nondilated distal small bowel loops seen in the right posterior pelvis adjacent to right iliac vessels on image 58/series 201. Surgical clips in are seen in the pelvis and this most likely represents an adhesion. No definite bowel wall thickening or mass seen. Diverticulosis is seen involving the descending sigmoid colon, however there is no evidence of diverticulitis. Vascular/Lymphatic: No pathologically enlarged lymph nodes. Saccular aneurysm of the infrarenal abdominal aorta seen measuring 3.2 cm in maximum diameter. This is increased from  2.8 cm in 2010. Reproductive: No mass or other significant abnormality. Other: None. Musculoskeletal: No suspicious bone lesions identified.  Benign-appearing compression fracture of the T12 vertebral body is seen, which may be acute or subacute in age, and is new compared to prior lumbar spine radiographs on 11/13/2014. Internal fixation hardware seen within the right hip. IMPRESSION: Mid to distal small bowel obstruction with transition point in the right posterior pelvis adjacent to right iliac vessels, likely due to adhesion. Colonic diverticulosis. No radiographic evidence of diverticulitis. Tiny calculi within urinary bladder. No evidence of urolithiasis or hydronephrosis. Benign appearing T12 vertebral body compression fracture, new compared to previous lumbar spine radiographs on 11/13/2014. 3.2 cm infrarenal abdominal aortic aneurysm. Recommend followup by ultrasound in 3 years. This recommendation follows ACR consensus guidelines: White Paper of the ACR Incidental Findings Committee II on Vascular Findings. Natasha Mead Coll Radiol 2013; 10:789-794 Electronically Signed   By: Earle Gell M.D.   On: 04/04/2016 18:21   Dg Chest Portable 1 View  Result Date: 04/12/2016 CLINICAL DATA:  Tachypnea. EXAM: PORTABLE CHEST 1 VIEW COMPARISON:  March 21, 2016 FINDINGS: Scarring is again seen in the left upper lung, abutting the hilum. This is stable. The heart, hila, and mediastinum are unchanged. No pulmonary nodules or masses. No infiltrates identified. IMPRESSION: No active disease. Electronically Signed   By: Dorise Bullion III M.D   On: 04/05/2016 22:32   Dg Abd Portable 1v  Result Date: 03/29/2016 CLINICAL DATA:  Evaluate NG tube EXAM: PORTABLE ABDOMEN - 1 VIEW COMPARISON:  None. FINDINGS: The NG tube terminates with the side port and distal tip in the stomach. IMPRESSION: The NG tube is in good position. Electronically Signed   By: Dorise Bullion III M.D   On: 04/12/2016 22:32   Urinalysis    Component Value Date/Time   COLORURINE YELLOW 04/09/2016 1521   APPEARANCEUR HAZY (A) 04/13/2016 1521   LABSPEC 1.026 03/31/2016 1521   PHURINE 5.5  04/07/2016 1521   GLUCOSEU NEGATIVE 04/07/2016 1521   HGBUR NEGATIVE 03/28/2016 1521   BILIRUBINUR NEGATIVE 04/12/2016 1521   KETONESUR NEGATIVE 04/04/2016 1521   PROTEINUR NEGATIVE 04/06/2016 1521   UROBILINOGEN 0.2 05/02/2012 0055   NITRITE NEGATIVE 03/25/2016 1521   LEUKOCYTESUR TRACE (A) 04/06/2016 1521   Sepsis Labs Invalid input(s): PROCALCITONIN,  WBC,  LACTICIDVEN   SIGNED:  Faye Ramsay, MD  [facility] 04/25/2016, 11:23 PM Pager [phone]  If 7PM-7AM, please contact night-coverage [email or web address] Password [email or web address]

## 2017-06-17 IMAGING — CT CT HEAD W/O CM
3 of 4 series · 15 of 47 positions shown, 18 images · non-contrast
Comparison: CT HEAD September 15, 2015

CLINICAL DATA: Fell going to get mail. Headache and pain. History
of lung cancer, tremor, deep brain stimulator.

EXAM:
CT HEAD WITHOUT CONTRAST
TECHNIQUE: Contiguous axial images were obtained from the base of the skull
through the vertex without intravenous contrast.

[Series 2: head w/o · axial · non-contrast · 0.45mm/px · z∈[+1086,+1206]mm · 9 of 32 slices shown, 12 images]
[im 4/32  brain]
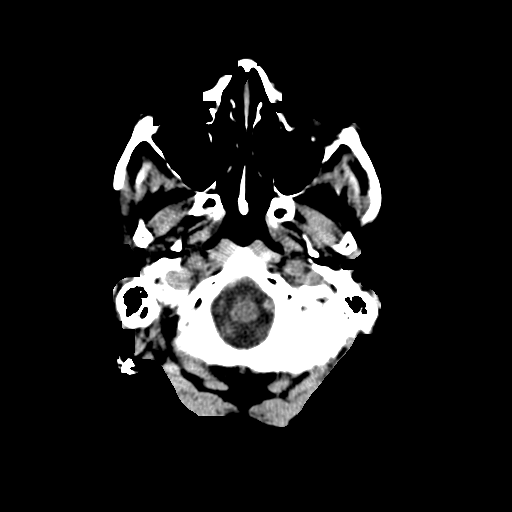
[im 4/32  bone]
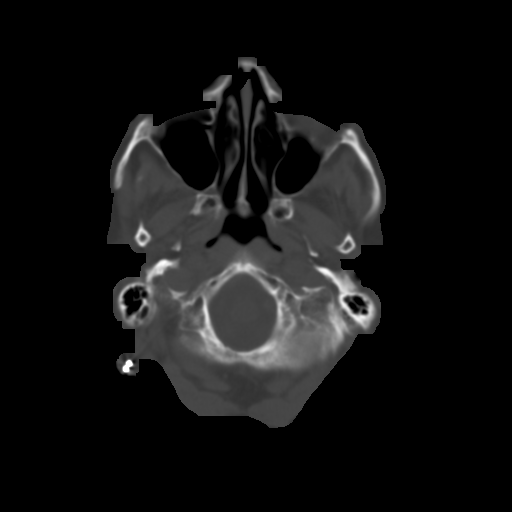
[im 7/32  brain]
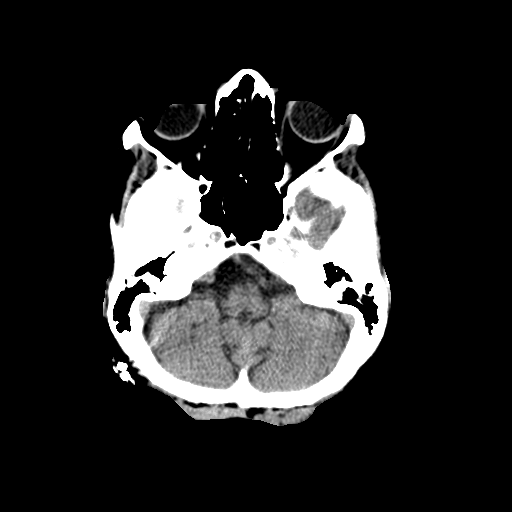
[im 10/32  brain]
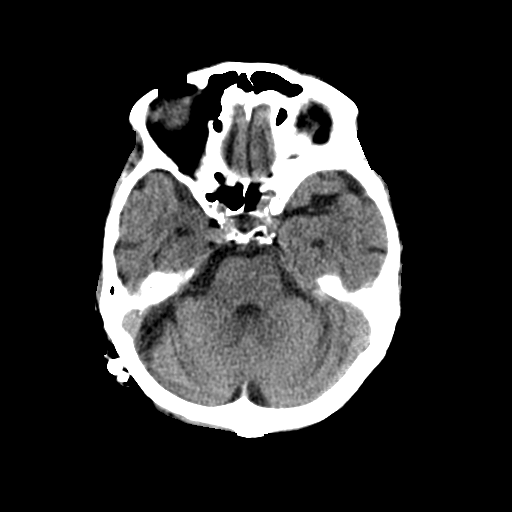
[im 13/32  brain]
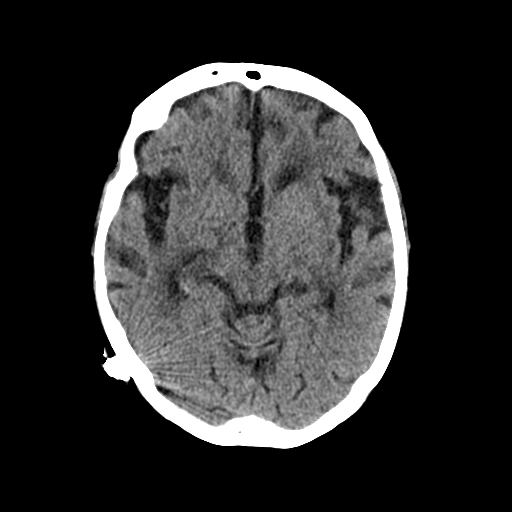
[im 16/32  brain]
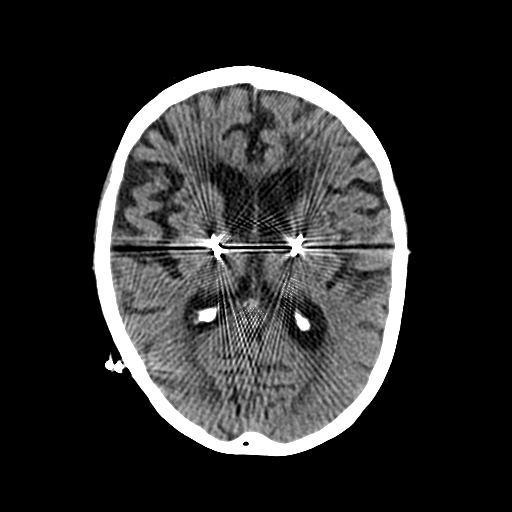
[im 16/32  bone]
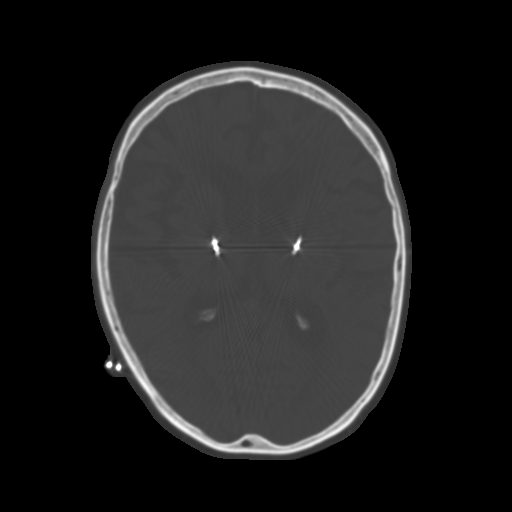
[im 19/32  brain]
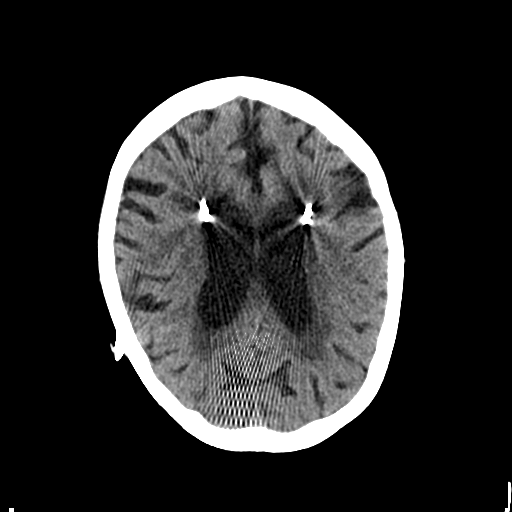
[im 22/32  brain]
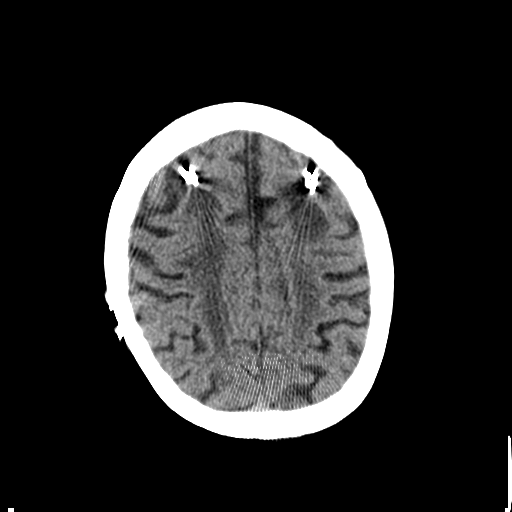
[im 25/32  brain]
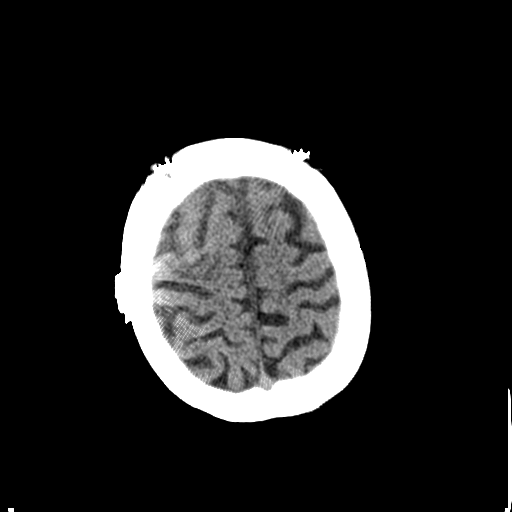
[im 28/32  brain]
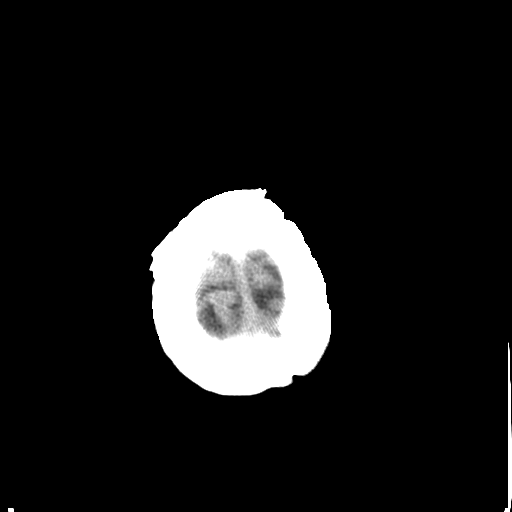
[im 28/32  bone]
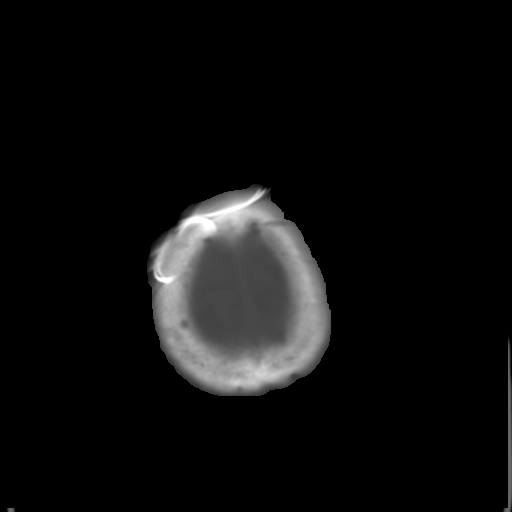

[Series 4: coronal · coronal · 0.29mm/px · 3 of 60 slices shown]
[im 20/60  brain]
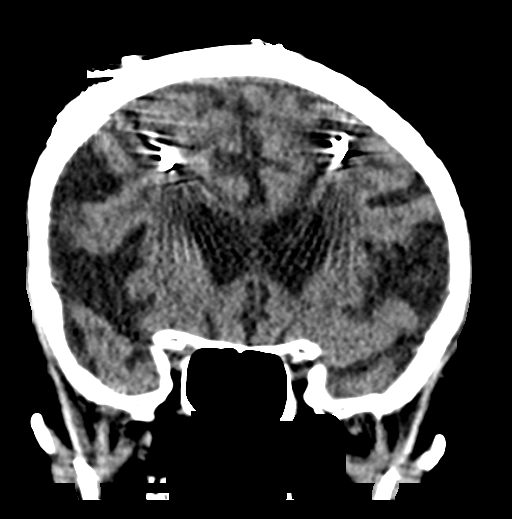
[im 27/60  brain]
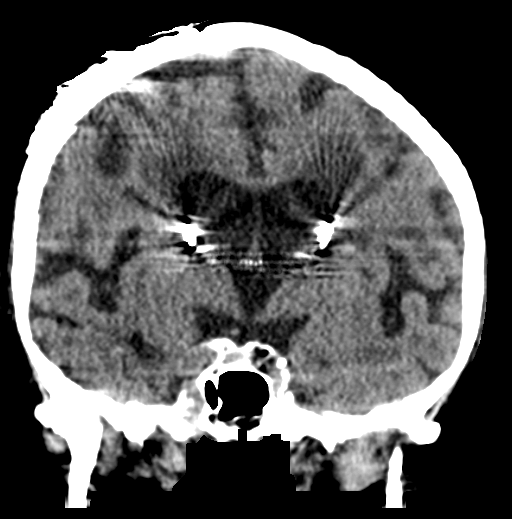
[im 33/60  brain]
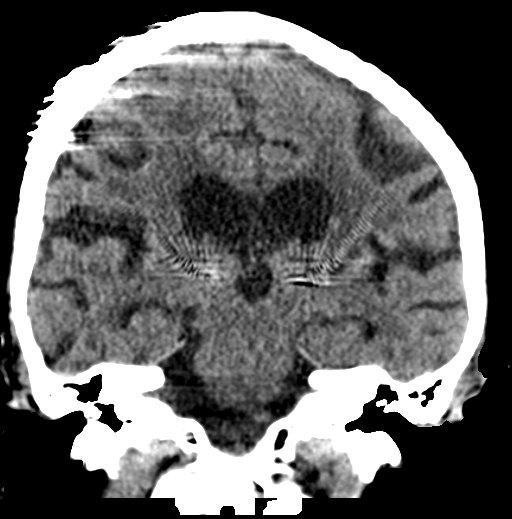

[Series 5: sagittal · sagittal · 0.31mm/px · 3 of 48 slices shown]
[im 16/48  brain]
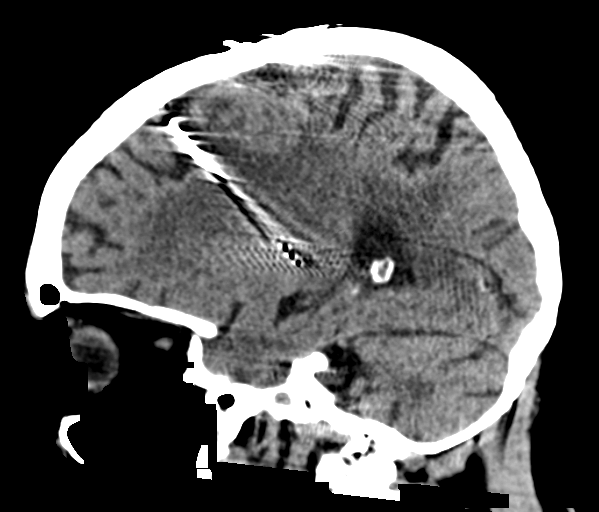
[im 24/48  brain]
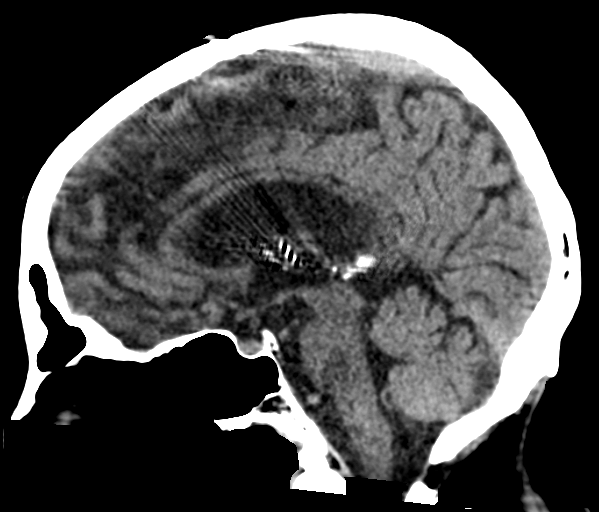
[im 32/48  brain]
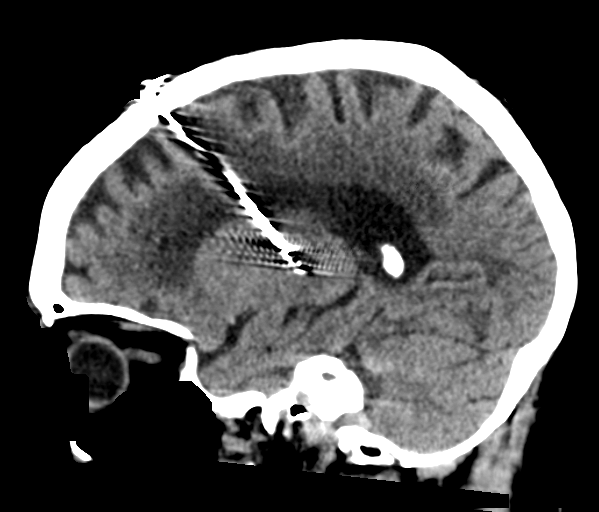

[15 of 47 positions shown; findings below may reference images not displayed]

FINDINGS: INTRACRANIAL CONTENTS: Similar moderate ventriculomegaly on the
basis of global parenchymal brain volume loss. Bilateral deep brain
stimulator leads terminate in the region of the thalamus. Stable
gliosis along the catheter tract. Patchy supratentorial white matter
hypodensities exclusive of the aforementioned abnormality. No
intraparenchymal hemorrhage, mass effect, midline shift or acute
large vascular territory infarcts. Basal cisterns are patent. Mild
calcific atherosclerosis of the carotid siphon.

ORBITS: The included ocular globes and orbital contents are
non-suspicious.

SINUSES: Mild RIGHT sphenoid sinusitis. Mastoid air cells are well
aerated.

SKULL/SOFT TISSUES: No skull fracture. No significant soft tissue
swelling. Stimulators leads course in RIGHT neck.
IMPRESSION: No acute intracranial process.

NISSAN ISAACN leads at the level of the bilateral thalamus. Moderate global
parenchymal brain volume loss and moderate chronic small vessel
ischemic disease.

## 2017-06-18 IMAGING — CR DG CHEST 2V
2 series · 2 of 2 positions shown · non-contrast
Comparison: 03/19/2016

CLINICAL DATA: Heart failure

EXAM:
CHEST  2 VIEW

[chest lat]
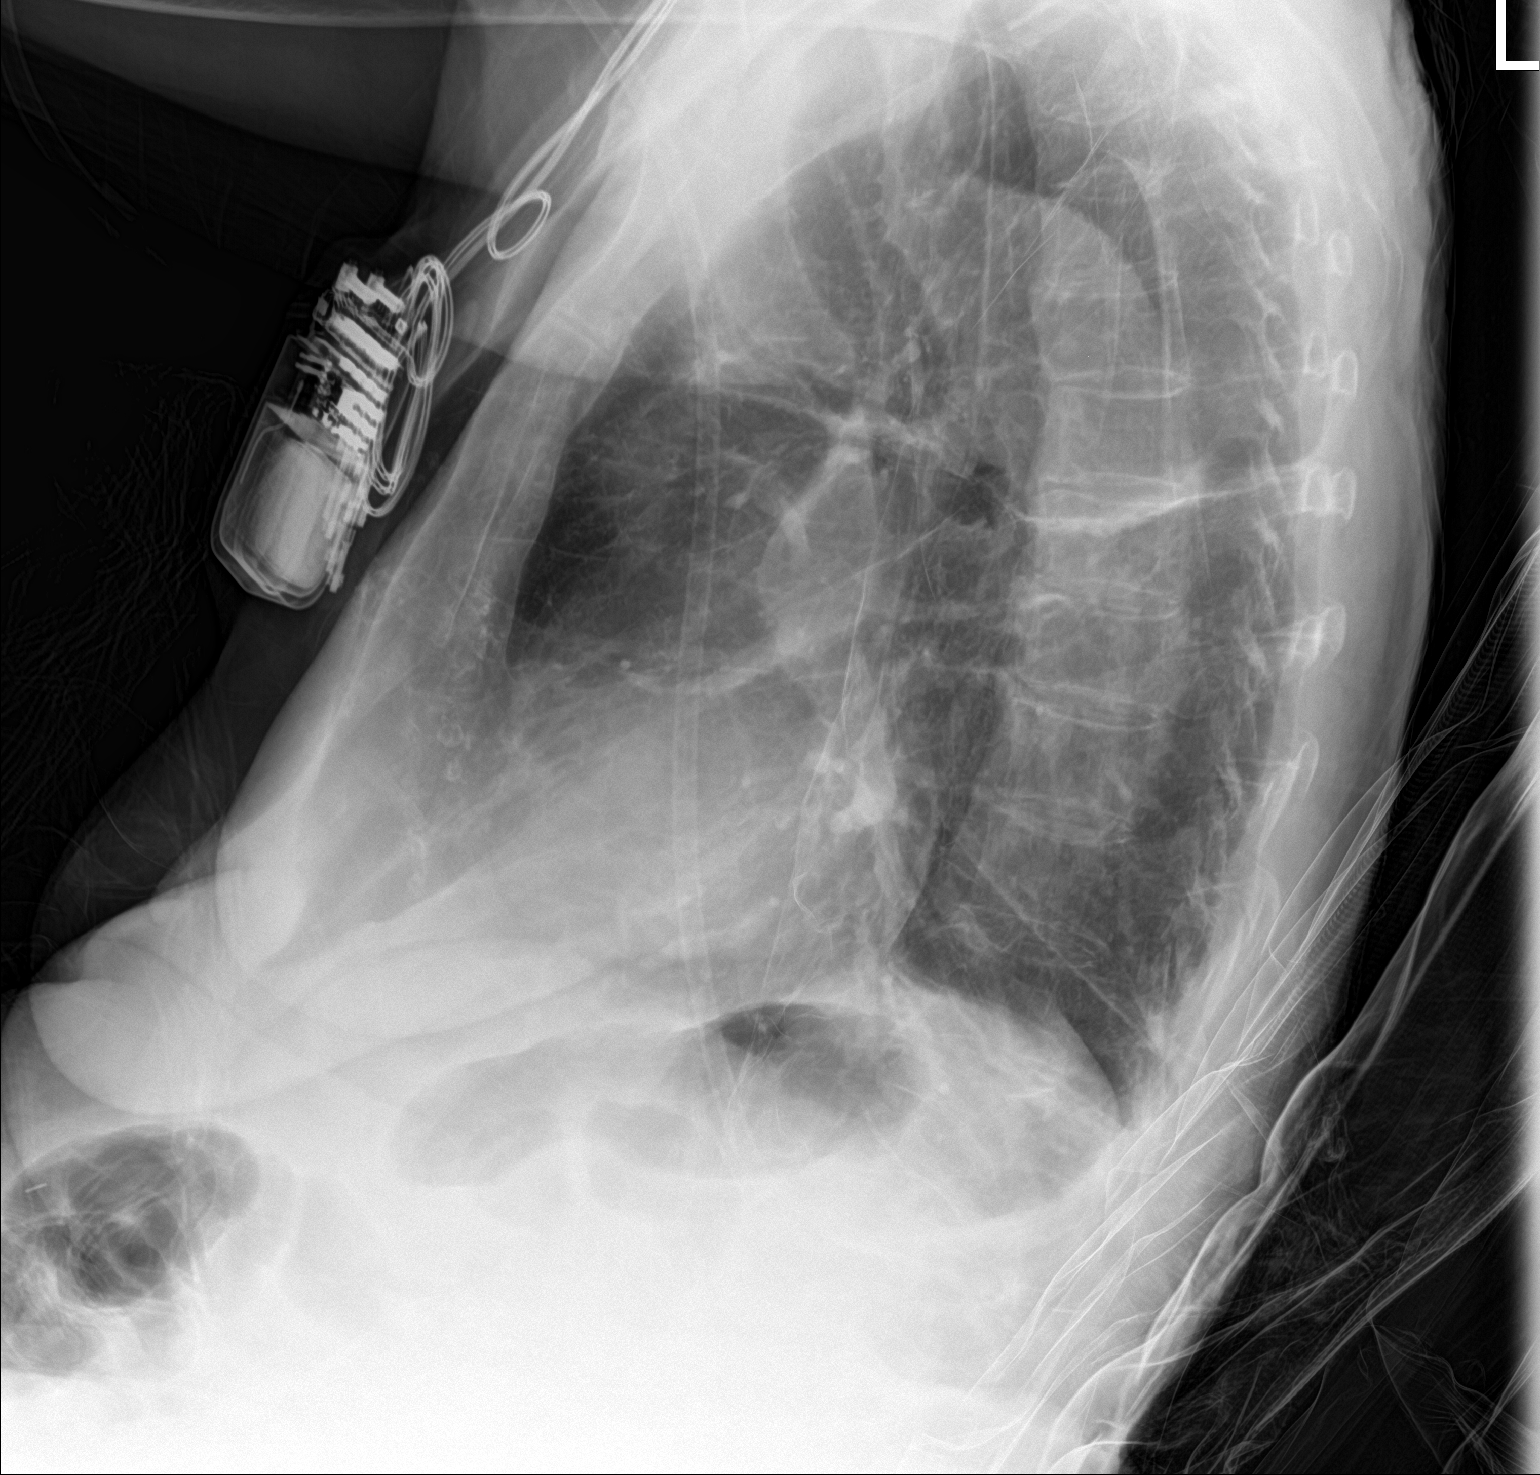

[chest ap]
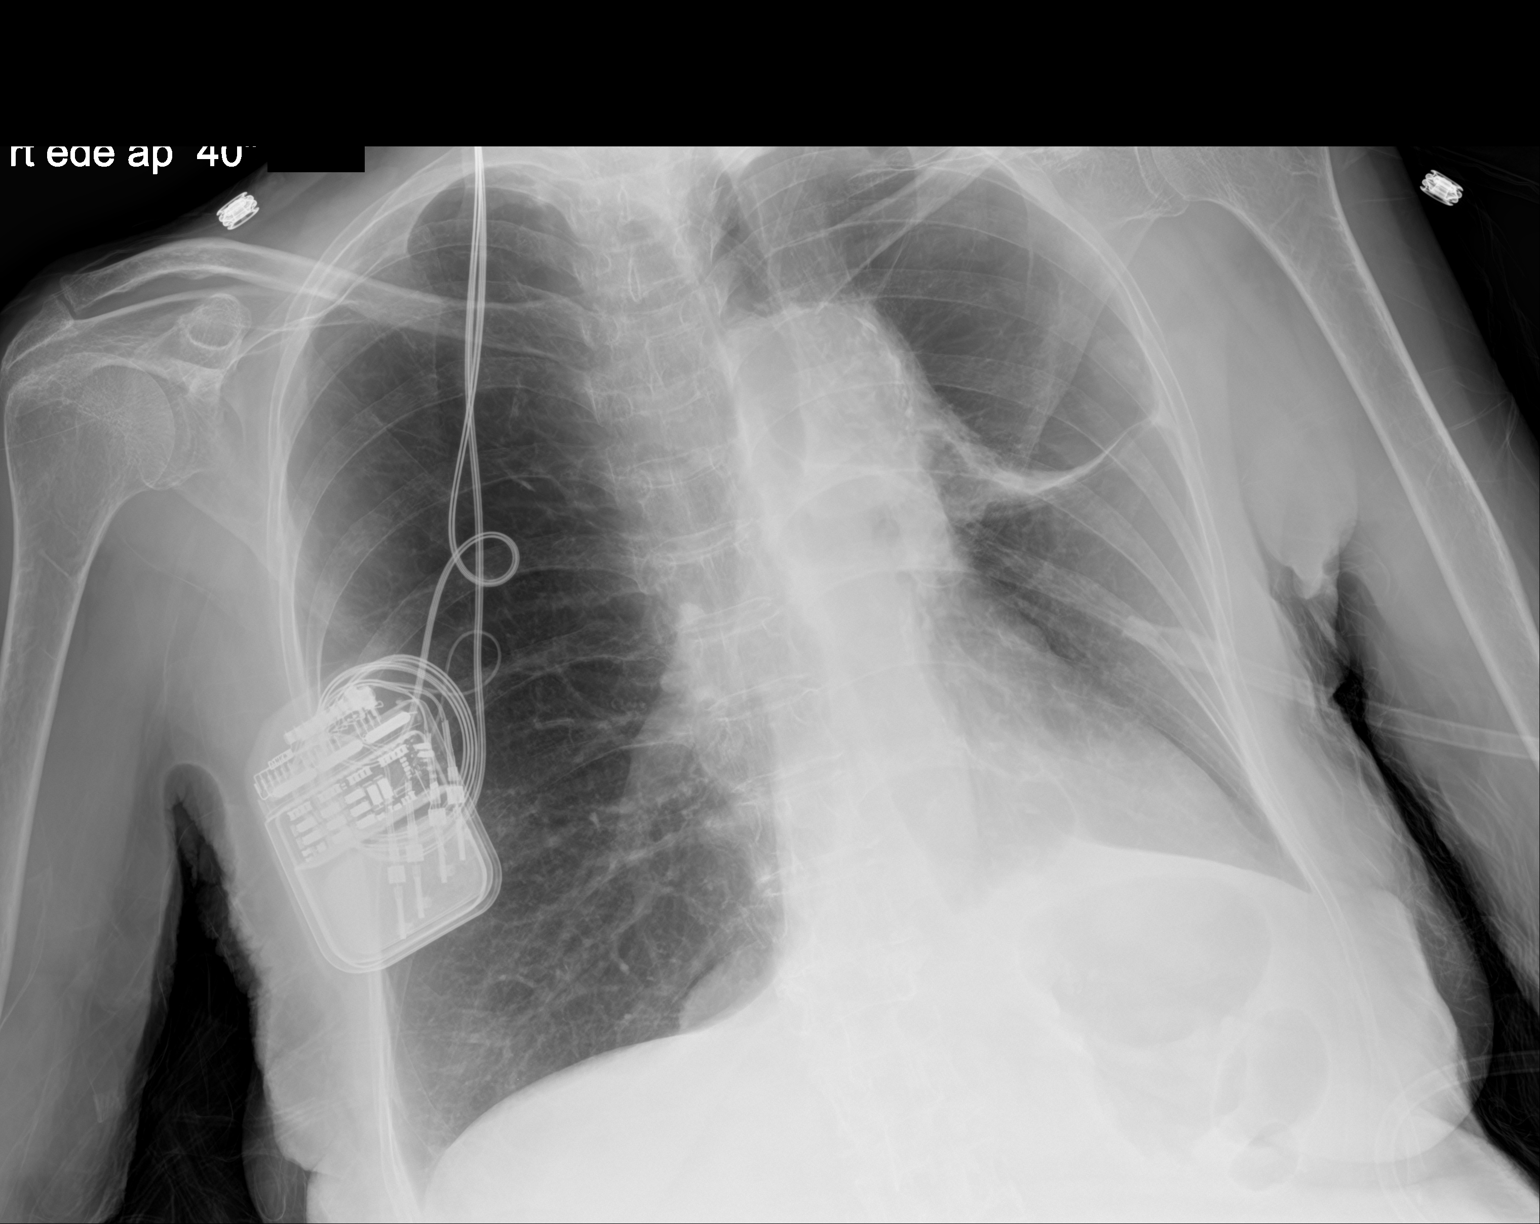

[2 of 2 positions shown; findings below may reference images not displayed]

FINDINGS: Cardiac shadow is stable. Scarring is again noted in the left upper
lung. AE stimulator pack is noted over the right chest wall. The
lungs are mildly hyperinflated. No acute bony abnormality is seen.
IMPRESSION: COPD with scarring in the left apex.  No acute abnormality noted.
